# Patient Record
Sex: Male | Born: 1955 | Race: White | Hispanic: No | Smoking: Former smoker
Health system: Southern US, Community
[De-identification: ages and names within clinical notes are randomized; demographics above are authoritative.]

## PROBLEM LIST (undated history)

## (undated) DIAGNOSIS — K567 Ileus, unspecified: Secondary | ICD-10-CM

## (undated) DIAGNOSIS — R188 Other ascites: Secondary | ICD-10-CM

## (undated) DIAGNOSIS — C61 Malignant neoplasm of prostate: Secondary | ICD-10-CM

## (undated) DIAGNOSIS — E119 Type 2 diabetes mellitus without complications: Secondary | ICD-10-CM

## (undated) DIAGNOSIS — K469 Unspecified abdominal hernia without obstruction or gangrene: Secondary | ICD-10-CM

## (undated) HISTORY — PX: PROSTATE SURGERY: SHX751

---

## 2006-08-09 ENCOUNTER — Emergency Department (HOSPITAL_COMMUNITY): Admission: EM | Admit: 2006-08-09 | Discharge: 2006-08-09 | Payer: Self-pay | Admitting: Family Medicine

## 2014-04-17 ENCOUNTER — Other Ambulatory Visit: Payer: Self-pay | Admitting: Internal Medicine

## 2014-06-12 ENCOUNTER — Other Ambulatory Visit: Payer: Self-pay | Admitting: Internal Medicine

## 2014-07-18 ENCOUNTER — Other Ambulatory Visit: Payer: Self-pay | Admitting: Internal Medicine

## 2016-05-24 ENCOUNTER — Encounter (HOSPITAL_BASED_OUTPATIENT_CLINIC_OR_DEPARTMENT_OTHER): Payer: Self-pay | Admitting: *Deleted

## 2016-05-24 ENCOUNTER — Emergency Department (HOSPITAL_BASED_OUTPATIENT_CLINIC_OR_DEPARTMENT_OTHER)
Admission: EM | Admit: 2016-05-24 | Discharge: 2016-05-25 | Disposition: A | Discharge status: Home / Self Care | Payer: BLUE CROSS/BLUE SHIELD | Attending: Emergency Medicine | Admitting: Emergency Medicine

## 2016-05-24 DIAGNOSIS — K567 Ileus, unspecified: Secondary | ICD-10-CM | POA: Diagnosis not present

## 2016-05-24 DIAGNOSIS — F17210 Nicotine dependence, cigarettes, uncomplicated: Secondary | ICD-10-CM | POA: Diagnosis not present

## 2016-05-24 DIAGNOSIS — Z794 Long term (current) use of insulin: Secondary | ICD-10-CM | POA: Diagnosis not present

## 2016-05-24 DIAGNOSIS — R188 Other ascites: Secondary | ICD-10-CM | POA: Diagnosis not present

## 2016-05-24 DIAGNOSIS — Z8546 Personal history of malignant neoplasm of prostate: Secondary | ICD-10-CM | POA: Insufficient documentation

## 2016-05-24 DIAGNOSIS — M6289 Other specified disorders of muscle: Secondary | ICD-10-CM | POA: Insufficient documentation

## 2016-05-24 DIAGNOSIS — Z7984 Long term (current) use of oral hypoglycemic drugs: Secondary | ICD-10-CM | POA: Insufficient documentation

## 2016-05-24 DIAGNOSIS — Z79899 Other long term (current) drug therapy: Secondary | ICD-10-CM | POA: Insufficient documentation

## 2016-05-24 DIAGNOSIS — R1084 Generalized abdominal pain: Secondary | ICD-10-CM | POA: Diagnosis present

## 2016-05-24 HISTORY — DX: Malignant neoplasm of prostate: C61

## 2016-05-24 LAB — COMPREHENSIVE METABOLIC PANEL
ALT: 21 U/L (ref 17–63)
ANION GAP: 9 (ref 5–15)
AST: 21 U/L (ref 15–41)
Albumin: 4 g/dL (ref 3.5–5.0)
Alkaline Phosphatase: 62 U/L (ref 38–126)
BUN: 14 mg/dL (ref 6–20)
CHLORIDE: 105 mmol/L (ref 101–111)
CO2: 24 mmol/L (ref 22–32)
CREATININE: 0.79 mg/dL (ref 0.61–1.24)
Calcium: 9.4 mg/dL (ref 8.9–10.3)
Glucose, Bld: 163 mg/dL — ABNORMAL HIGH (ref 65–99)
POTASSIUM: 3.8 mmol/L (ref 3.5–5.1)
Sodium: 138 mmol/L (ref 135–145)
Total Bilirubin: 0.7 mg/dL (ref 0.3–1.2)
Total Protein: 7.3 g/dL (ref 6.5–8.1)

## 2016-05-24 LAB — CBC WITH DIFFERENTIAL/PLATELET
Basophils Absolute: 0 10*3/uL (ref 0.0–0.1)
Basophils Relative: 0 %
EOS ABS: 0.2 10*3/uL (ref 0.0–0.7)
EOS PCT: 1 %
HCT: 42.2 % (ref 39.0–52.0)
HEMOGLOBIN: 14.1 g/dL (ref 13.0–17.0)
LYMPHS ABS: 1.5 10*3/uL (ref 0.7–4.0)
LYMPHS PCT: 12 %
MCH: 32 pg (ref 26.0–34.0)
MCHC: 33.4 g/dL (ref 30.0–36.0)
MCV: 95.7 fL (ref 78.0–100.0)
MONOS PCT: 5 %
Monocytes Absolute: 0.6 10*3/uL (ref 0.1–1.0)
NEUTROS PCT: 82 %
Neutro Abs: 9.9 10*3/uL — ABNORMAL HIGH (ref 1.7–7.7)
Platelets: 265 10*3/uL (ref 150–400)
RBC: 4.41 MIL/uL (ref 4.22–5.81)
RDW: 12.9 % (ref 11.5–15.5)
WBC: 12.1 10*3/uL — AB (ref 4.0–10.5)

## 2016-05-24 LAB — I-STAT CG4 LACTIC ACID, ED: LACTIC ACID, VENOUS: 1.74 mmol/L (ref 0.5–2.0)

## 2016-05-24 LAB — LIPASE, BLOOD: LIPASE: 23 U/L (ref 11–51)

## 2016-05-24 LAB — TROPONIN I

## 2016-05-24 MED ORDER — FENTANYL CITRATE (PF) 100 MCG/2ML IJ SOLN
50.0000 ug | Freq: Once | INTRAMUSCULAR | Status: AC
  Administered 2016-05-24: 50 ug via INTRAVENOUS
  Filled 2016-05-24: qty 2

## 2016-05-24 MED ORDER — ONDANSETRON HCL 4 MG/2ML IJ SOLN
4.0000 mg | Freq: Once | INTRAMUSCULAR | Status: AC
  Administered 2016-05-24: 4 mg via INTRAVENOUS
  Filled 2016-05-24: qty 2

## 2016-05-24 MED ORDER — FENTANYL CITRATE (PF) 100 MCG/2ML IJ SOLN
100.0000 ug | Freq: Once | INTRAMUSCULAR | Status: AC
  Administered 2016-05-25: 100 ug via INTRAVENOUS
  Filled 2016-05-24: qty 2

## 2016-05-24 NOTE — ED Provider Notes (Signed)
CSN: [id number]     Arrival date & time 05/24/16  2209 History   By signing my name below, I, Higinio Plan, attest that this documentation has been prepared under the direction and in the presence of Iasia Forcier, MD . Electronically Signed: Higinio Plan, Scribe. 05/25/2016. 12:25 AM    Chief Complaint  Patient presents with  . Abdominal Pain   Patient is a 60 y.o. male presenting with abdominal pain. The history is provided by the patient. No language interpreter was used.  Abdominal Pain Pain location:  Generalized Pain quality: pressure   Pain radiates to:  Does not radiate Pain severity:  Severe Onset quality:  Gradual Timing:  Constant Progression:  Worsening Chronicity:  New Context: not alcohol use, not sick contacts and not trauma   Relieved by:  Nothing Worsened by:  Nothing tried Ineffective treatments:  None tried Associated symptoms: nausea and vomiting   Associated symptoms: no constipation, no cough, no dysuria and no fever   Risk factors: recent hospitalization   Risk factors: no alcohol abuse    HPI Comments:  Ryan [name] is a 60 y.o. male with PMHx of prostate cancer, who presents to the Emergency Department complaining of gradually worsening, "pressure-like," abdominal pain that began ~6pm this evening. Pt states associated symptoms of 2 episodes of vomiting today (1 episode was self-induced), 1 episode of diarrhea yesterday, diaphoresis, and belching. He reports some alleviation of pain after belching and BM. Per daughter, pt has a bump on his upper abdomen that could possibly be a hernia. His daughter also states an associated symptom of pallor. Pt denies back pain, dysuria, and urinary frequency. Per daughter, pt recently had a prostectomy due to cancer by Butch Penny at [facility]. He reports he is not currently undergoing chemotherapy.   Past Medical History  Diagnosis Date  . Prostate cancer [facility])    Past Surgical History  Procedure Laterality Date  . Prostate  surgery     History reviewed. No pertinent family history. Social History  Substance Use Topics  . Smoking status: Current Every Day Smoker -- 0.50 packs/day    Types: Cigarettes  . Smokeless tobacco: None  . Alcohol Use: 1.2 oz/week    2 Cans of beer per week    Review of Systems  Constitutional: Positive for diaphoresis. Negative for fever.  Respiratory: Negative for cough.   Gastrointestinal: Positive for nausea, vomiting and abdominal pain. Negative for constipation.  Genitourinary: Negative for dysuria and frequency.  Musculoskeletal: Negative for back pain.  All other systems reviewed and are negative.  Allergies  Review of patient's allergies indicates no known allergies.  Home Medications   Prior to Admission medications   Medication Sig Start Date End Date Taking? Authorizing Provider  insulin aspart (NOVOLOG) 100 UNIT/ML injection Inject into the skin 3 (three) times daily before meals.   Yes Historical Provider, MD  lisinopril (PRINIVIL,ZESTRIL) 40 MG tablet Take 40 mg by mouth daily.   Yes Historical Provider, MD  metFORMIN (GLUCOPHAGE) 1000 MG tablet Take 1,000 mg by mouth 2 (two) times daily with a meal.   Yes Historical Provider, MD   Triage Vitals: BP 174/95 mmHg  Pulse 74  Temp(Src) 97.8 F (36.6 C) (Oral)  Resp 18  Ht '6\' 2"'$  (1.88 m)  Wt 280 lb (127.007 kg)  BMI 35.93 kg/m2  SpO2 97% Physical Exam  Constitutional: He is oriented to person, place, and time. He appears well-developed and well-nourished. No distress.  HENT:  Head: Normocephalic and atraumatic.  Mouth/Throat: Oropharynx is clear and moist. No oropharyngeal exudate.  Moist mucous membranes   Eyes: Conjunctivae are normal. Pupils are equal, round, and reactive to light.  Neck: Normal range of motion. Neck supple. No JVD present.  Trachea midline No bruit  Cardiovascular: Normal rate, regular rhythm, normal heart sounds and intact distal pulses.   Pulmonary/Chest: Effort normal and breath  sounds normal. No stridor. No respiratory distress. He has no wheezes. He has no rales.  Abdominal: Soft. He exhibits distension. There is tenderness. There is no rebound, no guarding, no tenderness at McBurney's point and negative Murphy's sign. A hernia is present. Hernia confirmed positive in the ventral area.    Tinkling bowel sounds above umbilicus Quiet bowel sounds from umbilicus down Surgical wound: 1in in length and 8cm diameter fibrous debris with serosanguinous drainage  Musculoskeletal:  Legs soft  Upper left trocar has a 1inch, 8cm in diameter fibrous and draining  Neurological: He is alert and oriented to person, place, and time. He has normal reflexes.  Skin: Skin is warm and dry.  Psychiatric: He has a normal mood and affect. His behavior is normal.  Nursing note and vitals reviewed.   ED Course  Hernia reduction Date/Time: 05/25/2016 12:09 AM Performed by: Veatrice Kells Authorized by: Veatrice Kells Consent: Verbal consent obtained. Patient identity confirmed: arm band Preparation: Patient was prepped and draped in the usual sterile fashion. Local anesthesia used: no Patient sedated: no Patient tolerance: Patient tolerated the procedure well with no immediate complications Comments: Reduced with improvement in pain    DIAGNOSTIC STUDIES:  Oxygen Saturation is 97% on RA, normal by my interpretation.    COORDINATION OF CARE:  11:20 PM Discussed treatment plan, which includes NG tube with pt at bedside and pt agreed to plan.  Labs Review Labs Reviewed  COMPREHENSIVE METABOLIC PANEL - Abnormal; Notable for the following:    Glucose, Bld 163 (*)    All other components within normal limits  CBC WITH DIFFERENTIAL/PLATELET - Abnormal; Notable for the following:    WBC 12.1 (*)    Neutro Abs 9.9 (*)    All other components within normal limits  LIPASE, BLOOD  URINALYSIS, ROUTINE W REFLEX MICROSCOPIC (NOT AT [facility]  TROPONIN I  I-STAT CG4 LACTIC ACID, ED     Imaging Review No results found. I have personally reviewed and evaluated these images and lab results as part of my medical decision-making.   EKG Interpretation  Date/Time:  'Sunday May 24 2016 22:28:09 EDT Ventricular Rate:  77 PR Interval:  172 QRS Duration: 88 QT Interval:  378 QTC Calculation: 427 R Axis:   16 Text Interpretation:  Normal sinus rhythm Normal ECG No previous ECGs available Confirmed by LITTLE MD, RACHEL ([id number]) on 05/24/2016 10:31:10 PM        EKG Interpretation   Date/Time:  Sunday May 24 2016 22:28:09 EDT Ventricular Rate:  77 PR Interval:  172 QRS Duration: 88 QT Interval:  378 QTC Calculation: 427 R Axis:   16 Text Interpretation:  Normal sinus rhythm Normal ECG No previous ECGs  available Confirmed by LITTLE MD, RACHEL ([id number]) on 05/24/2016 10:31:10 PM     12'$ :09 AM Attempted to reduce abdominal hernia. Reduction successful.   MDM   Final diagnoses:  None    Filed Vitals:   05/25/16 0200 05/25/16 0230  BP: 137/70 156/87  Pulse: 81 83  Temp:    Resp: 14 12   Results for orders placed or performed during the hospital encounter of  05/24/16  Lipase, blood  Result Value Ref Range   Lipase 23 11 - 51 U/L  Comprehensive metabolic panel  Result Value Ref Range   Sodium 138 135 - 145 mmol/L   Potassium 3.8 3.5 - 5.1 mmol/L   Chloride 105 101 - 111 mmol/L   CO2 24 22 - 32 mmol/L   Glucose, Bld 163 (H) 65 - 99 mg/dL   BUN 14 6 - 20 mg/dL   Creatinine, Ser 0.79 0.61 - 1.24 mg/dL   Calcium 9.4 8.9 - 10.3 mg/dL   Total Protein 7.3 6.5 - 8.1 g/dL   Albumin 4.0 3.5 - 5.0 g/dL   AST 21 15 - 41 U/L   ALT 21 17 - 63 U/L   Alkaline Phosphatase 62 38 - 126 U/L   Total Bilirubin 0.7 0.3 - 1.2 mg/dL   GFR calc non Af Amer >60 >60 mL/min   GFR calc Af Amer >60 >60 mL/min   Anion gap 9 5 - 15  Troponin I  Result Value Ref Range   Troponin I <0.03 <0.031 ng/mL  CBC with Differential  Result Value Ref Range   WBC 12.1 (H) 4.0 - 10.5 K/uL    RBC 4.41 4.22 - 5.81 MIL/uL   Hemoglobin 14.1 13.0 - 17.0 g/dL   HCT 42.2 39.0 - 52.0 %   MCV 95.7 78.0 - 100.0 fL   MCH 32.0 26.0 - 34.0 pg   MCHC 33.4 30.0 - 36.0 g/dL   RDW 12.9 11.5 - 15.5 %   Platelets 265 150 - 400 K/uL   Neutrophils Relative % 82 %   Neutro Abs 9.9 (H) 1.7 - 7.7 K/uL   Lymphocytes Relative 12 %   Lymphs Abs 1.5 0.7 - 4.0 K/uL   Monocytes Relative 5 %   Monocytes Absolute 0.6 0.1 - 1.0 K/uL   Eosinophils Relative 1 %   Eosinophils Absolute 0.2 0.0 - 0.7 K/uL   Basophils Relative 0 %   Basophils Absolute 0.0 0.0 - 0.1 K/uL  I-Stat CG4 Lactic Acid, ED  Result Value Ref Range   Lactic Acid, Venous 1.74 0.5 - 2.0 mmol/L   Ct Abdomen Pelvis W Contrast  05/25/2016  CLINICAL DATA:  Pressure like abdominal pain beginning tonight. Recent prostatectomy (Apr 27, 2016). Per chart, hernia that has been reduced with symptomatic improvement. EXAM: CT ABDOMEN AND PELVIS WITH CONTRAST TECHNIQUE: Multidetector CT imaging of the abdomen and pelvis was performed using the standard protocol following bolus administration of intravenous contrast. CONTRAST:  153m ISOVUE-300 IOPAMIDOL (ISOVUE-300) INJECTION 61% COMPARISON:  03/11/2016 CT at [facility] - marked for merge. FINDINGS: Lower chest and abdominal wall: Small left inguinal hernia containing ascitic fluid. There is fatty enlargement of both inguinal canals. Small supraumbilical hernia containing ascitic fluid. Hepatobiliary: Scattered hepatic low densities consistent with cysts, stable.No evidence of biliary obstruction or stone. Pancreas: Unremarkable. Spleen: Unremarkable. Adrenals/Urinary Tract: Negative adrenals. No hydronephrosis or stone. Unremarkable bladder. Reproductive:Interval prostatectomy. No fluid collection or other abnormality at the resection site. No pelvic adenopathy. Stomach/Bowel: There is a segment of fluid-filled and distended small bowel in the lower central abdomen with mild mesenteric congestion and  interloop fluid. No evidence of deep vascularized bowel. No focal transition point. The colon is not decompressed. Vascular/Lymphatic: Atherosclerosis. No acute vascular finding. No mass or adenopathy. Peritoneal: Small volume ascites scattered throughout the abdomen, unexpected nearly 1 month after surgery. No specific cause of ascitic fluid is identified. Musculoskeletal: No acute finding.  Disc degeneration. IMPRESSION: 1. Few  distended loops of small bowel without focal transition point. Given recently reduced hernia, question resolving bowel obstruction. 2. Small ascites without visible cause, unexpected nearly 1 month after surgery. 3. Small midline and left inguinal hernias containing ascitic fluid. 4. Unremarkable appearance of prostatectomy site. Electronically Signed   By: Monte Fantasia M.D.   On: 05/25/2016 01:55    Medications  ondansetron (ZOFRAN) injection 4 mg (4 mg Intravenous Given 05/24/16 2252)  fentaNYL (SUBLIMAZE) injection 50 mcg (50 mcg Intravenous Given 05/24/16 2316)  fentaNYL (SUBLIMAZE) injection 100 mcg (100 mcg Intravenous Given 05/25/16 0009)  iopamidol (ISOVUE-300) 61 % injection 100 mL (100 mLs Intravenous Contrast Given 05/25/16 0117)    Case d/w Dr. Al Corpus of urology if PO challenged in the ED safe for discharge to be call for an appointment in clinic this am.    PO challenged successfully.  Feeling better. Patient and family are amenable to following up today at [facility].  Strict return precautions given.     I personally performed the services described in this documentation, which was scribed in my presence. The recorded information has been reviewed and is accurate.     Veatrice Kells, MD 05/25/16 [phone]

## 2016-05-24 NOTE — ED Notes (Signed)
Pt had prostate surgery on May 8th.  pts incision on his left lower quadrant is noted to be red, and have yellow drainage.

## 2016-05-24 NOTE — ED Notes (Signed)
Pt reports 'gassy' abdomen with vomiting that occurred before dinner tonight.  Pt appears pale, SOB, diaphoretic.

## 2016-05-24 NOTE — ED Notes (Signed)
Dr. Randal Buba at [facility].

## 2016-05-24 NOTE — ED Notes (Addendum)
Pt placed in trendelenburg by Dr. Randal Buba with ice pack on abd, pending reduction of abd hernia, hernia appeared after prostate surgery. (denies: fever, constipation or bleeding). States, urinary habits have not returned to normal. Last BM 2000, last ate 1900, last emesis 2245. Alert, NAD, calm, interactive, "feel better".

## 2016-05-25 ENCOUNTER — Emergency Department (HOSPITAL_BASED_OUTPATIENT_CLINIC_OR_DEPARTMENT_OTHER): Payer: BLUE CROSS/BLUE SHIELD

## 2016-05-25 ENCOUNTER — Encounter (HOSPITAL_BASED_OUTPATIENT_CLINIC_OR_DEPARTMENT_OTHER): Payer: Self-pay | Admitting: Emergency Medicine

## 2016-05-25 IMAGING — CT CT ABD-PELV W/ CM
2 of 5 series · 16 of 46 positions shown, 18 images · IV contrast (APPLIED)
Comparison: [DATE] CT at [REDACTED] - marked for
merge.

CLINICAL DATA: Pressure like abdominal pain beginning tonight.
Recent prostatectomy ([DATE]). Per chart, hernia that has been
reduced with symptomatic improvement.

EXAM:
CT ABDOMEN AND PELVIS WITH CONTRAST
TECHNIQUE: Multidetector CT imaging of the abdomen and pelvis was performed
using the standard protocol following bolus administration of
intravenous contrast.
CONTRAST:  100mL [43] IOPAMIDOL ([43]) INJECTION 61%

[Series 2: axial st · axial · 0.97mm/px · z∈[-573,-73]mm · 13 of 112 slices shown, 15 images]
[im 6/112  soft-tissue]
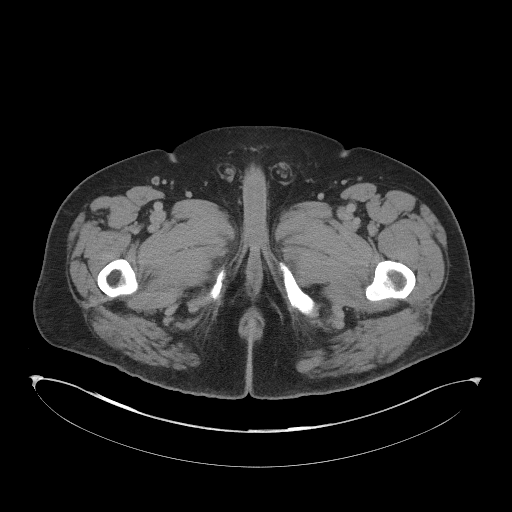
[im 6/112  bone]
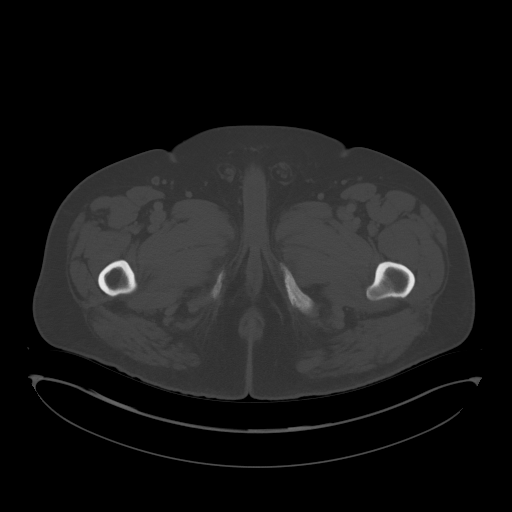
[im 17/112  soft-tissue]
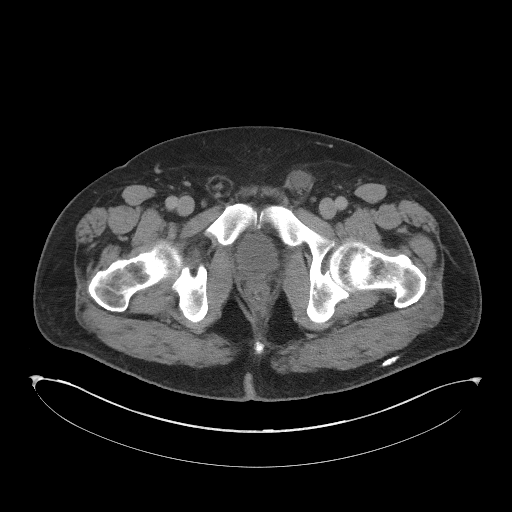
[im 23/112  soft-tissue]
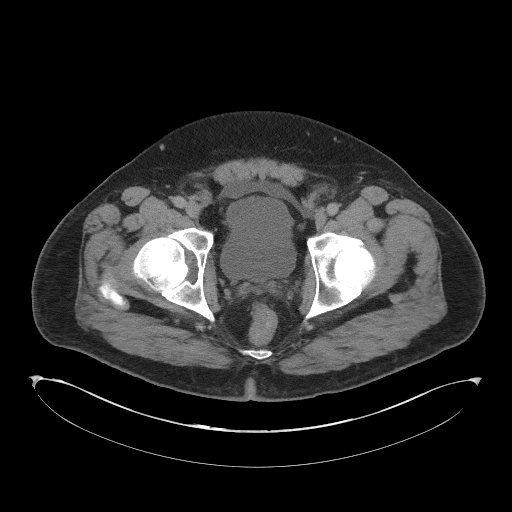
[im 34/112  soft-tissue]
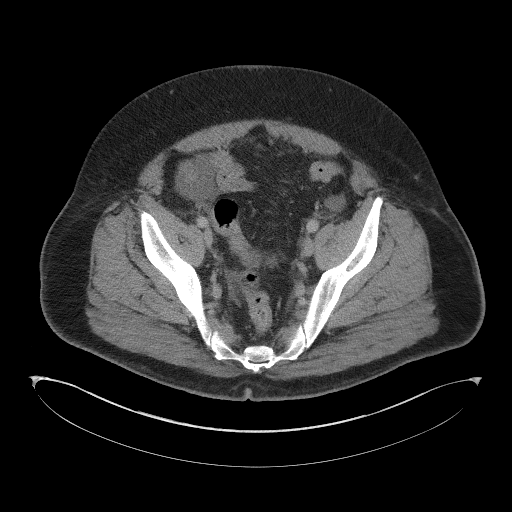
[im 39/112  soft-tissue]
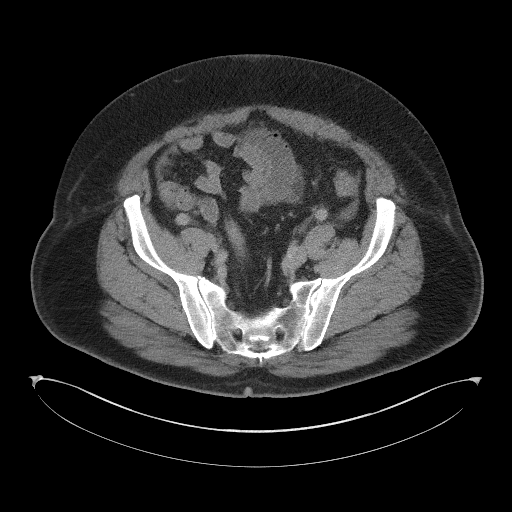
[im 50/112  soft-tissue]
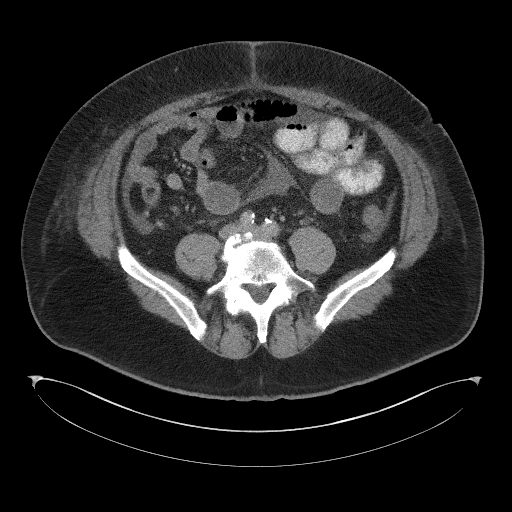
[im 56/112  soft-tissue]
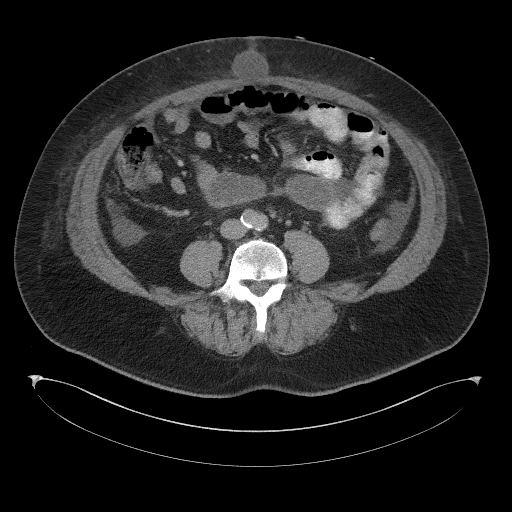
[im 62/112  soft-tissue]
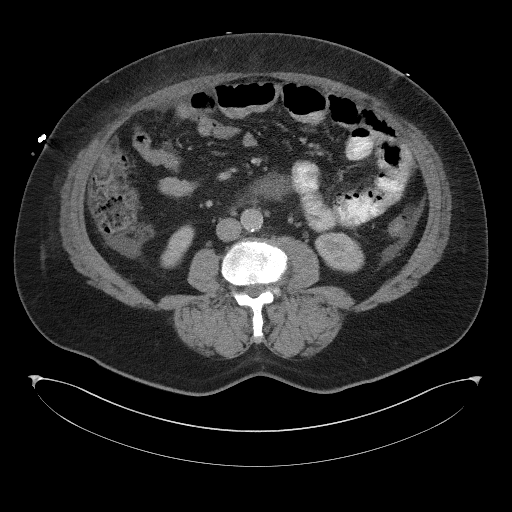
[im 73/112  soft-tissue]
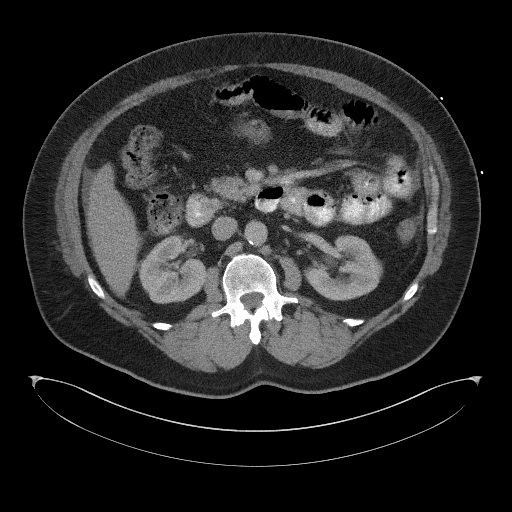
[im 73/112  bone]
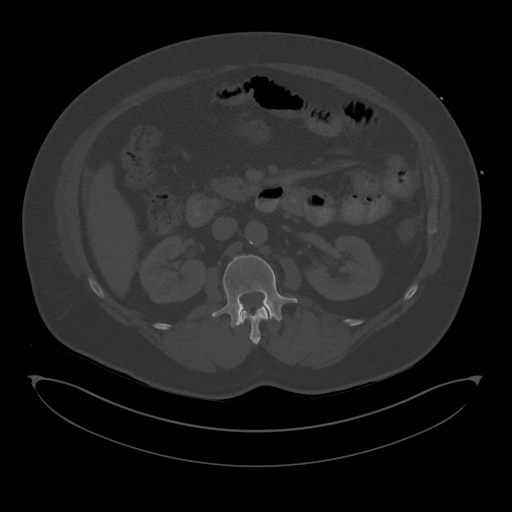
[im 78/112  soft-tissue]
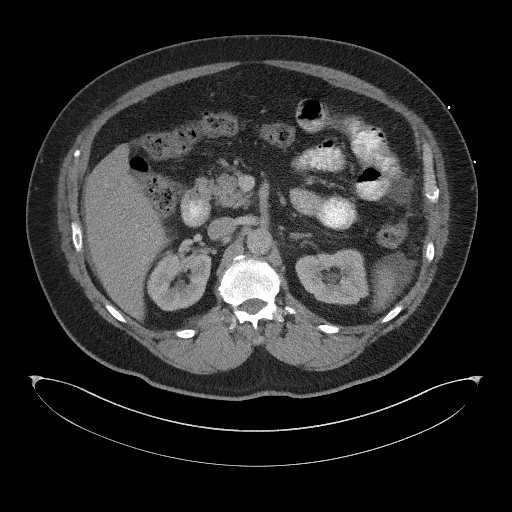
[im 89/112  soft-tissue]
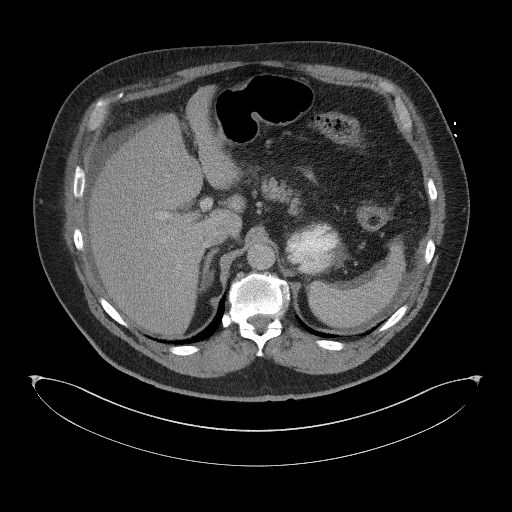
[im 95/112  soft-tissue]
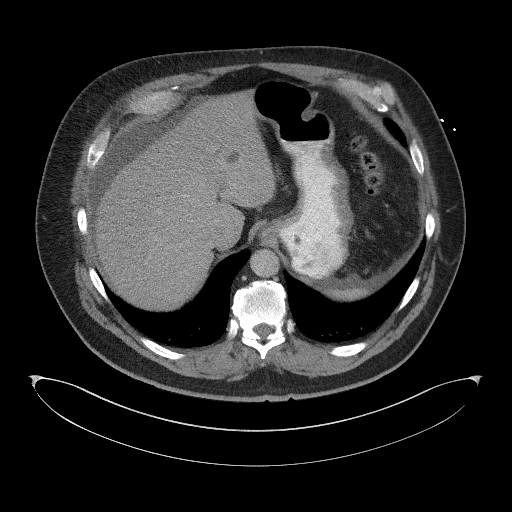
[im 106/112  soft-tissue]
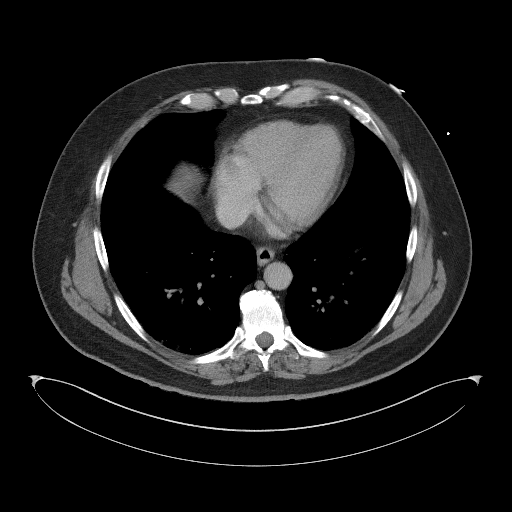

[Series 5: coronal st · coronal · 1.17mm/px · 3 of 115 slices shown]
[im 39/115  soft-tissue]
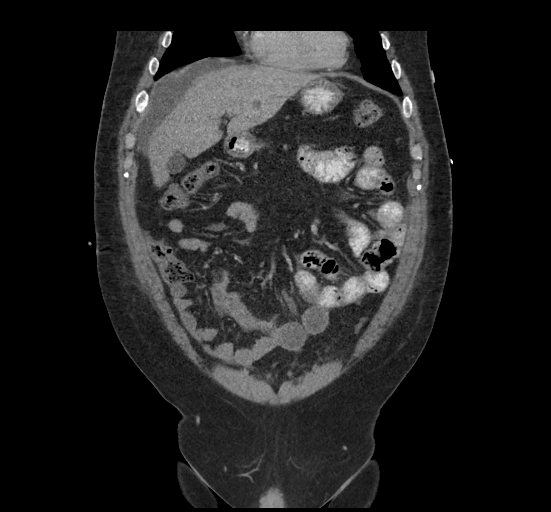
[im 51/115  soft-tissue]
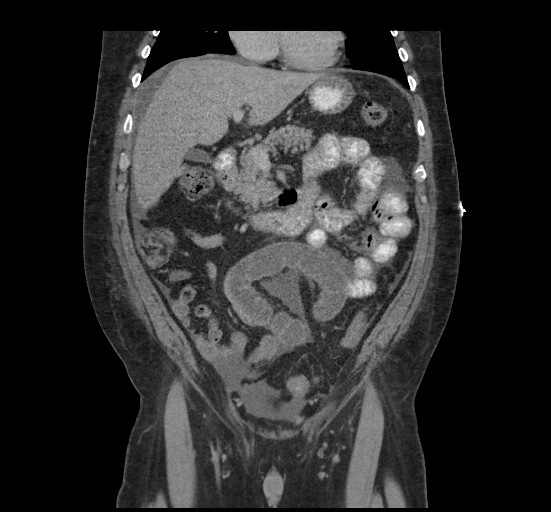
[im 64/115  soft-tissue]
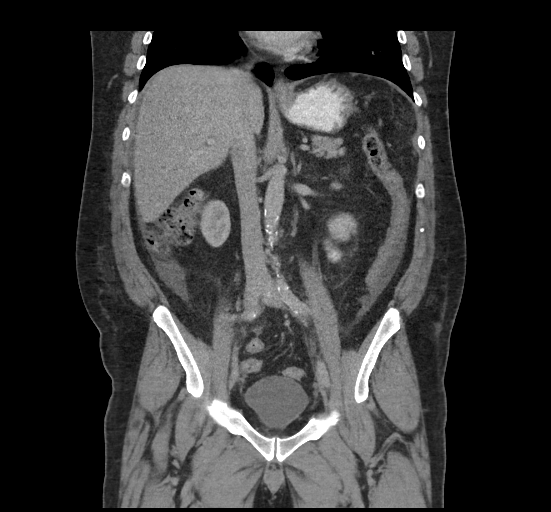

[16 of 46 positions shown; findings below may reference images not displayed]

FINDINGS: Lower chest and abdominal wall: Small left inguinal hernia
containing ascitic fluid. There is fatty enlargement of both
inguinal canals. Small supraumbilical hernia containing ascitic
fluid.

Hepatobiliary: Scattered hepatic low densities consistent with
cysts, stable.No evidence of biliary obstruction or stone.

Pancreas: Unremarkable.

Spleen: Unremarkable.

Adrenals/Urinary Tract: Negative adrenals. No hydronephrosis or
stone. Unremarkable bladder.

Reproductive:Interval prostatectomy. No fluid collection or other
abnormality at the resection site. No pelvic adenopathy.

Stomach/Bowel: There is a segment of fluid-filled and distended
small bowel in the lower central abdomen with mild mesenteric
congestion and interloop fluid. No evidence of deep vascularized
bowel. No focal transition point. The colon is not decompressed.

Vascular/Lymphatic: Atherosclerosis. No acute vascular finding. No
mass or adenopathy.

Peritoneal: Small volume ascites scattered throughout the abdomen,
unexpected nearly 1 month after surgery. No specific cause of
ascitic fluid is identified.

Musculoskeletal: No acute finding.  Disc degeneration.
IMPRESSION: 1. Few distended loops of small bowel without focal transition
point. Given recently reduced hernia, question resolving bowel
obstruction.
2. Small ascites without visible cause, unexpected nearly 1 month
after surgery.
3. Small midline and left inguinal hernias containing ascitic fluid.
4. Unremarkable appearance of prostatectomy site.

## 2016-05-25 MED ORDER — ONDANSETRON 8 MG PO TBDP: ORAL_TABLET | ORAL | Status: DC

## 2016-05-25 MED ORDER — FENTANYL CITRATE (PF) 100 MCG/2ML IJ SOLN: 100.0000 ug | INTRAMUSCULAR | Status: DC | PRN

## 2016-05-25 MED ORDER — IOPAMIDOL (ISOVUE-300) INJECTION 61%
100.0000 mL | Freq: Once | INTRAVENOUS | Status: AC | PRN
  Administered 2016-05-25: 100 mL via INTRAVENOUS

## 2016-05-25 MED ORDER — ABDOMINAL BINDER/ELASTIC 2XL MISC: 1.0000 [IU] | Freq: Every day | Status: DC

## 2016-05-25 NOTE — ED Notes (Signed)
Apple Juice given and pt is tolerating well. Dr. Randal Buba at bedside.

## 2016-05-25 NOTE — ED Notes (Signed)
Back from CT, alert, NAD, calm, interactive, no dyspnea noted, HOB 30 degrees.

## 2016-05-25 NOTE — ED Notes (Signed)
Dr. Randal Buba in to reduce abd hernia, "successful reduction", pt moved from trendelenburg to flat, lying on side to drink PO contrast.

## 2016-05-25 NOTE — ED Notes (Signed)
Dr. Randal Buba at [facility]

## 2018-12-28 ENCOUNTER — Encounter (HOSPITAL_BASED_OUTPATIENT_CLINIC_OR_DEPARTMENT_OTHER): Payer: Self-pay

## 2018-12-28 ENCOUNTER — Emergency Department (HOSPITAL_BASED_OUTPATIENT_CLINIC_OR_DEPARTMENT_OTHER)
Admission: EM | Admit: 2018-12-28 | Discharge: 2018-12-28 | Disposition: A | Discharge status: Home / Self Care | Payer: BLUE CROSS/BLUE SHIELD | Attending: Emergency Medicine | Admitting: Emergency Medicine

## 2018-12-28 ENCOUNTER — Other Ambulatory Visit: Payer: Self-pay

## 2018-12-28 DIAGNOSIS — E119 Type 2 diabetes mellitus without complications: Secondary | ICD-10-CM | POA: Diagnosis not present

## 2018-12-28 DIAGNOSIS — F17210 Nicotine dependence, cigarettes, uncomplicated: Secondary | ICD-10-CM | POA: Insufficient documentation

## 2018-12-28 DIAGNOSIS — Z79899 Other long term (current) drug therapy: Secondary | ICD-10-CM | POA: Insufficient documentation

## 2018-12-28 DIAGNOSIS — Z794 Long term (current) use of insulin: Secondary | ICD-10-CM | POA: Insufficient documentation

## 2018-12-28 DIAGNOSIS — Z8546 Personal history of malignant neoplasm of prostate: Secondary | ICD-10-CM | POA: Diagnosis not present

## 2018-12-28 DIAGNOSIS — K439 Ventral hernia without obstruction or gangrene: Secondary | ICD-10-CM | POA: Insufficient documentation

## 2018-12-28 DIAGNOSIS — R1033 Periumbilical pain: Secondary | ICD-10-CM | POA: Diagnosis present

## 2018-12-28 HISTORY — DX: Other ascites: R18.8

## 2018-12-28 HISTORY — DX: Ileus, unspecified: K56.7

## 2018-12-28 HISTORY — DX: Type 2 diabetes mellitus without complications: E11.9

## 2018-12-28 HISTORY — DX: Unspecified abdominal hernia without obstruction or gangrene: K46.9

## 2018-12-28 LAB — CBC WITH DIFFERENTIAL/PLATELET
Abs Immature Granulocytes: 0.03 10*3/uL (ref 0.00–0.07)
BASOS PCT: 1 %
Basophils Absolute: 0.1 10*3/uL (ref 0.0–0.1)
EOS ABS: 0.3 10*3/uL (ref 0.0–0.5)
Eosinophils Relative: 3 %
HCT: 44.7 % (ref 39.0–52.0)
Hemoglobin: 15 g/dL (ref 13.0–17.0)
Immature Granulocytes: 0 %
Lymphocytes Relative: 35 %
Lymphs Abs: 3.5 10*3/uL (ref 0.7–4.0)
MCH: 32.5 pg (ref 26.0–34.0)
MCHC: 33.6 g/dL (ref 30.0–36.0)
MCV: 97 fL (ref 80.0–100.0)
MONO ABS: 0.8 10*3/uL (ref 0.1–1.0)
Monocytes Relative: 8 %
NEUTROS ABS: 5.4 10*3/uL (ref 1.7–7.7)
NEUTROS PCT: 53 %
PLATELETS: 234 10*3/uL (ref 150–400)
RBC: 4.61 MIL/uL (ref 4.22–5.81)
RDW: 12.1 % (ref 11.5–15.5)
WBC: 10.2 10*3/uL (ref 4.0–10.5)
nRBC: 0 % (ref 0.0–0.2)

## 2018-12-28 LAB — COMPREHENSIVE METABOLIC PANEL
ALT: 21 U/L (ref 0–44)
AST: 21 U/L (ref 15–41)
Albumin: 4.1 g/dL (ref 3.5–5.0)
Alkaline Phosphatase: 54 U/L (ref 38–126)
Anion gap: 9 (ref 5–15)
BUN: 12 mg/dL (ref 8–23)
CHLORIDE: 105 mmol/L (ref 98–111)
CO2: 23 mmol/L (ref 22–32)
CREATININE: 0.73 mg/dL (ref 0.61–1.24)
Calcium: 9.1 mg/dL (ref 8.9–10.3)
Glucose, Bld: 193 mg/dL — ABNORMAL HIGH (ref 70–99)
POTASSIUM: 3.6 mmol/L (ref 3.5–5.1)
Sodium: 137 mmol/L (ref 135–145)
Total Bilirubin: 0.6 mg/dL (ref 0.3–1.2)
Total Protein: 7.1 g/dL (ref 6.5–8.1)

## 2018-12-28 LAB — LIPASE, BLOOD: LIPASE: 102 U/L — AB (ref 11–51)

## 2018-12-28 MED ORDER — HYDROMORPHONE HCL 1 MG/ML IJ SOLN
1.0000 mg | Freq: Once | INTRAMUSCULAR | Status: AC
Start: 2018-12-28 — End: 2018-12-28
  Administered 2018-12-28: 1 mg via INTRAVENOUS
  Filled 2018-12-28: qty 1

## 2018-12-28 MED ORDER — ONDANSETRON HCL 4 MG/2ML IJ SOLN
4.0000 mg | Freq: Once | INTRAMUSCULAR | Status: AC
  Administered 2018-12-28: 4 mg via INTRAVENOUS
  Filled 2018-12-28: qty 2

## 2018-12-28 NOTE — ED Triage Notes (Signed)
C/o abd pain x 1 hour-NAD-steady gait

## 2018-12-28 NOTE — ED Provider Notes (Signed)
Hernia reduction Date/Time: 12/28/2018 4:59 PM Performed by: Quintella Reichert, MD Authorized by: Quintella Reichert, MD  Consent: Verbal consent obtained. Patient identity confirmed: verbally with patient Local anesthesia used: pain control with dilaudid.  Anesthesia: Local anesthesia used: pain control with dilaudid. Patient tolerance: Patient tolerated the procedure well with no immediate complications    Ventral hernia reduced with gentle, steady pressure, patient in supine position.     Quintella Reichert, MD 12/28/18 2019

## 2018-12-28 NOTE — Discharge Instructions (Addendum)
If you develop any belly pain, vomiting or nausea, please come back to the Emergency Room. You will need to wear your abdominal binder until you follow up with surgery.   Please call and schedule follow up with your surgeon to discuss repair of your hernia tomorrow.

## 2018-12-28 NOTE — ED Notes (Signed)
C/o abd pain sudden onset 1 hour ago while sleeping  Denies inj  Hx of hernia

## 2018-12-28 NOTE — ED Provider Notes (Signed)
[place] EMERGENCY DEPARTMENT Provider Note   CSN: [id number] Arrival date & time: 12/28/18  1558   History   Chief Complaint Chief Complaint  Patient presents with  . Abdominal Pain    HPI Ryan [name] is a 63 y.o. male.  Patient reporting today after waking up with extreme abdominal pain.  Reports that he woke up about 45 minutes prior to coming into the emergency room.  He has a known umbilical hernia that has previously had to be reduced 2 years ago.  Patient states that he feels about the same now.  He states that he is in a lot of pain.  Patient denies any fevers, chills, nausea, vomiting, diarrhea.  He has not tried anything for the pain.  The history is provided by the patient and the spouse.    Past Medical History:  Diagnosis Date  . Ascites   . Diabetes mellitus without complication ([place])   . Hernia of abdominal cavity   . Ileus ([place])   . Prostate cancer ([place])     There are no active problems to display for this patient.   Past Surgical History:  Procedure Laterality Date  . PROSTATE SURGERY        Home Medications    Prior to Admission medications   Medication Sig Start Date End Date Taking? Authorizing Provider  Elastic Bandages & Supports (ABDOMINAL BINDER/ELASTIC 2XL) MISC 1 Units by Does not apply route daily. 05/25/16   Palumbo, April, MD  insulin aspart (NOVOLOG) 100 UNIT/ML injection Inject into the skin 3 (three) times daily before meals.    [provider]  lisinopril (PRINIVIL,ZESTRIL) 40 MG tablet Take 40 mg by mouth daily.    [provider]  metFORMIN (GLUCOPHAGE) 1000 MG tablet Take 1,000 mg by mouth 2 (two) times daily with a meal.    [provider]  ondansetron (ZOFRAN ODT) 8 MG disintegrating tablet 8mg  ODT q8 hours prn nausea 05/25/16   Palumbo, April, MD    Family History No family history on file.  Social History Social History   Tobacco Use  . Smoking status: Current Every Day Smoker   Packs/day: 0.50    Types: Cigarettes  . Smokeless tobacco: Never Used  Substance Use Topics  . Alcohol use: Yes    Comment: occ  . Drug use: No     Allergies   Patient has no known allergies.   Review of Systems Review of Systems  Constitutional: Negative for chills and fever.  Gastrointestinal: Positive for abdominal pain. Negative for diarrhea, nausea and vomiting.  All other systems reviewed and are negative.   Physical Exam Updated Vital Signs BP (!) 161/77 (BP Location: Right Arm)   Pulse 82   Temp 97.7 F (36.5 C) (Oral)   Resp 20   Ht 6\' 2"  (1.88 m)   Wt 123.8 kg   SpO2 99%   BMI 35.05 kg/m   Physical Exam Constitutional:      General: He is not in acute distress.    Appearance: He is obese.  HENT:     Head: Normocephalic and atraumatic.  Pulmonary:     Effort: Pulmonary effort is normal.  Abdominal:     General: A surgical scar is present.     Palpations: There is mass.     Tenderness: There is abdominal tenderness in the periumbilical area.     Hernia: A hernia is present. Hernia is present in the ventral area.     Comments: Large, difficult  to reduce ventral hernia. Was able to reduce.   Neurological:     General: No focal deficit present.     Mental Status: He is alert.  Psychiatric:        Behavior: Behavior normal.     ED Treatments / Results  Labs (all labs ordered are listed, but only abnormal results are displayed) Labs Reviewed  COMPREHENSIVE METABOLIC PANEL - Abnormal; Notable for the following components:      Result Value   Glucose, Bld 193 (*)    All other components within normal limits  LIPASE, BLOOD - Abnormal; Notable for the following components:   Lipase 102 (*)    All other components within normal limits  CBC WITH DIFFERENTIAL/PLATELET    EKG None  Radiology No results found.  Procedures Procedures (including critical care time)  Medications Ordered in ED Medications  HYDROmorphone (DILAUDID) injection 1 mg  (1 mg Intravenous Given 12/28/18 1632)  ondansetron (ZOFRAN) injection 4 mg (4 mg Intravenous Given 12/28/18 1632)    Initial Impression / Assessment and Plan / ED Course  I have reviewed the triage vital signs and the nursing notes.  Pertinent labs & imaging results that were available during my care of the patient were reviewed by me and considered in my medical decision making (see chart for details).   Patient is a 63 year old male here with incarcerated umbilical hernia.  Initially unable to reproduce.  Patient given Zofran as well as 1 mg IV Dilaudid.  After pain medicine and affect hernia was reduced with some difficulty.  However afterwards patient reports much improved pain.  We will monitor patient and do p.o. challenge.  Patient to follow-up with his surgeon outpatient to discuss whether or not he may need to have this hernia repaired.  Given strict return precautions.  Given abdominal binder.  [place] Dorien [place], DO PGY-2, [facility]   Final Clinical Impressions(s) / ED Diagnoses   Final diagnoses:  Ventral hernia without obstruction or gangrene    ED Discharge Orders    None       Janari Gagner, [place], DO 12/28/18 1751    Quintella Reichert, MD 12/28/18 2021

## 2019-01-01 ENCOUNTER — Emergency Department (HOSPITAL_BASED_OUTPATIENT_CLINIC_OR_DEPARTMENT_OTHER)
Admission: EM | Admit: 2019-01-01 | Discharge: 2019-01-01 | Disposition: A | Discharge status: Home / Self Care | Payer: BLUE CROSS/BLUE SHIELD | Attending: Emergency Medicine | Admitting: Emergency Medicine

## 2019-01-01 ENCOUNTER — Encounter (HOSPITAL_BASED_OUTPATIENT_CLINIC_OR_DEPARTMENT_OTHER): Payer: Self-pay | Admitting: Emergency Medicine

## 2019-01-01 ENCOUNTER — Other Ambulatory Visit: Payer: Self-pay

## 2019-01-01 DIAGNOSIS — Z79899 Other long term (current) drug therapy: Secondary | ICD-10-CM | POA: Diagnosis not present

## 2019-01-01 DIAGNOSIS — Z8546 Personal history of malignant neoplasm of prostate: Secondary | ICD-10-CM | POA: Insufficient documentation

## 2019-01-01 DIAGNOSIS — E119 Type 2 diabetes mellitus without complications: Secondary | ICD-10-CM | POA: Diagnosis not present

## 2019-01-01 DIAGNOSIS — K432 Incisional hernia without obstruction or gangrene: Secondary | ICD-10-CM | POA: Diagnosis not present

## 2019-01-01 DIAGNOSIS — Z794 Long term (current) use of insulin: Secondary | ICD-10-CM | POA: Insufficient documentation

## 2019-01-01 DIAGNOSIS — F17210 Nicotine dependence, cigarettes, uncomplicated: Secondary | ICD-10-CM | POA: Insufficient documentation

## 2019-01-01 DIAGNOSIS — R109 Unspecified abdominal pain: Secondary | ICD-10-CM | POA: Diagnosis present

## 2019-01-01 LAB — COMPREHENSIVE METABOLIC PANEL
ALT: 22 U/L (ref 0–44)
AST: 21 U/L (ref 15–41)
Albumin: 4 g/dL (ref 3.5–5.0)
Alkaline Phosphatase: 65 U/L (ref 38–126)
Anion gap: 9 (ref 5–15)
BUN: 17 mg/dL (ref 8–23)
CO2: 24 mmol/L (ref 22–32)
Calcium: 9.2 mg/dL (ref 8.9–10.3)
Chloride: 100 mmol/L (ref 98–111)
Creatinine, Ser: 0.96 mg/dL (ref 0.61–1.24)
GFR calc Af Amer: >60 mL/min (ref >60)
GFR calc non Af Amer: >60 mL/min (ref >60)
GLUCOSE: 238 mg/dL — AB (ref 70–99)
Potassium: 4.5 mmol/L (ref 3.5–5.1)
Sodium: 133 mmol/L — ABNORMAL LOW (ref 135–145)
Total Bilirubin: 0.5 mg/dL (ref 0.3–1.2)
Total Protein: 7.2 g/dL (ref 6.5–8.1)

## 2019-01-01 LAB — CBC WITH DIFFERENTIAL/PLATELET
ABS IMMATURE GRANULOCYTES: 0.04 10*3/uL (ref 0.00–0.07)
Basophils Absolute: 0 10*3/uL (ref 0.0–0.1)
Basophils Relative: 0 %
EOS ABS: 0.5 10*3/uL (ref 0.0–0.5)
EOS PCT: 5 %
HCT: 43.5 % (ref 39.0–52.0)
Hemoglobin: 14.5 g/dL (ref 13.0–17.0)
IMMATURE GRANULOCYTES: 0 %
Lymphocytes Relative: 30 %
Lymphs Abs: 2.8 10*3/uL (ref 0.7–4.0)
MCH: 32.2 pg (ref 26.0–34.0)
MCHC: 33.3 g/dL (ref 30.0–36.0)
MCV: 96.5 fL (ref 80.0–100.0)
MONO ABS: 0.6 10*3/uL (ref 0.1–1.0)
Monocytes Relative: 7 %
NRBC: 0 % (ref 0.0–0.2)
Neutro Abs: 5.3 10*3/uL (ref 1.7–7.7)
Neutrophils Relative %: 58 %
PLATELETS: 243 10*3/uL (ref 150–400)
RBC: 4.51 MIL/uL (ref 4.22–5.81)
RDW: 11.9 % (ref 11.5–15.5)
WBC: 9.3 10*3/uL (ref 4.0–10.5)

## 2019-01-01 LAB — LIPASE, BLOOD: Lipase: 22 U/L (ref 11–51)

## 2019-01-01 MED ORDER — HYDROMORPHONE HCL 1 MG/ML IJ SOLN
INTRAMUSCULAR | Status: AC
  Filled 2019-01-01: qty 1

## 2019-01-01 MED ORDER — HYDROMORPHONE HCL 1 MG/ML IJ SOLN
1.0000 mg | Freq: Once | INTRAMUSCULAR | Status: AC
  Administered 2019-01-01: 1 mg via INTRAVENOUS
  Filled 2019-01-01: qty 1

## 2019-01-01 MED ORDER — SODIUM CHLORIDE 0.9 % IV BOLUS
500.0000 mL | Freq: Once | INTRAVENOUS | Status: AC
  Administered 2019-01-01: 500 mL via INTRAVENOUS

## 2019-01-01 MED ORDER — TRAMADOL HCL 50 MG PO TABS: 50.0000 mg | ORAL_TABLET | Freq: Four times a day (QID) | ORAL | 0 refills | Status: DC | PRN

## 2019-01-01 MED ORDER — HYDROMORPHONE HCL 1 MG/ML IJ SOLN
1.0000 mg | Freq: Once | INTRAMUSCULAR | Status: AC
Start: 2019-01-01 — End: 2019-01-01
  Administered 2019-01-01: 1 mg via INTRAVENOUS
  Filled 2019-01-01: qty 1

## 2019-01-01 MED ORDER — SODIUM CHLORIDE 0.9 % IV SOLN
INTRAVENOUS | Status: DC
  Administered 2019-01-01: 09:00:00 via INTRAVENOUS

## 2019-01-01 MED ORDER — ONDANSETRON HCL 4 MG/2ML IJ SOLN
4.0000 mg | Freq: Once | INTRAMUSCULAR | Status: AC
  Administered 2019-01-01: 4 mg via INTRAVENOUS
  Filled 2019-01-01: qty 2

## 2019-01-01 MED ORDER — LORAZEPAM 2 MG/ML IJ SOLN
1.0000 mg | Freq: Once | INTRAMUSCULAR | Status: AC
  Administered 2019-01-01: 1 mg via INTRAVENOUS
  Filled 2019-01-01: qty 1

## 2019-01-01 MED ORDER — HYDROMORPHONE HCL 1 MG/ML IJ SOLN
1.0000 mg | Freq: Once | INTRAMUSCULAR | Status: AC
  Administered 2019-01-01: 1 mg via INTRAVENOUS

## 2019-01-01 NOTE — Discharge Instructions (Signed)
Follow back up with general surgery at [facility] as planned for your hernia repair on January 23.  Return for any new or worse symptoms.  As we discussed there should be some soreness around the area.  But there should not be any fevers any significant abdominal pain or nausea and vomiting.  Wear your hernia brace as directed.

## 2019-01-01 NOTE — ED Triage Notes (Signed)
Pt reports pain around abd hernia x 30 min. Was seen here earlier this week for same. He has surgery scheduled on the 23rd.

## 2019-01-01 NOTE — ED Notes (Signed)
Asking for more pain medicine, ED MD aware, remains drowsy

## 2019-01-01 NOTE — ED Notes (Signed)
ED Provider at bedside. 

## 2019-01-01 NOTE — ED Provider Notes (Signed)
[facility] EMERGENCY DEPARTMENT Provider Note   CSN: [id number] Arrival date & time: 01/01/19  0705     History   Chief Complaint Chief Complaint  Patient presents with  . Abdominal Pain    HPI Rosie Golson is a 63 y.o. male.  Patient with a known history of ventral hernia.  Being followed by general surgery at [facility].  He saw them on January 10.  He is scheduled for ventral hernia repair with mesh on January 23.  Seen here for ventral hernia pain on January 8.  They were able to reduce the hernia.  Patient states that it got stuck again about 30 minutes prior to arrival.  No nausea or vomiting.  Past medical history just significant for diabetes.  According to general surgery notes at [facility] patient had a prior operation for prostate cancer and this is where the hernia has occurred.     Past Medical History:  Diagnosis Date  . Ascites   . Diabetes mellitus without complication ([place])   . Hernia of abdominal cavity   . Ileus ([place])   . Prostate cancer ([place])     There are no active problems to display for this patient.   Past Surgical History:  Procedure Laterality Date  . PROSTATE SURGERY          Home Medications    Prior to Admission medications   Medication Sig Start Date End Date Taking? Authorizing Provider  Elastic Bandages & Supports (ABDOMINAL BINDER/ELASTIC 2XL) MISC 1 Units by Does not apply route daily. 05/25/16   Palumbo, April, MD  insulin aspart (NOVOLOG) 100 UNIT/ML injection Inject into the skin 3 (three) times daily before meals.    [provider]  lisinopril (PRINIVIL,ZESTRIL) 40 MG tablet Take 40 mg by mouth daily.    [provider]  metFORMIN (GLUCOPHAGE) 1000 MG tablet Take 1,000 mg by mouth 2 (two) times daily with a meal.    [provider]  ondansetron (ZOFRAN ODT) 8 MG disintegrating tablet 8mg  ODT q8 hours prn nausea 05/25/16   Palumbo, April, MD    Family History No family history on file.  Social  History Social History   Tobacco Use  . Smoking status: Current Every Day Smoker    Packs/day: 0.50    Types: Cigarettes  . Smokeless tobacco: Never Used  Substance Use Topics  . Alcohol use: Yes    Comment: occ  . Drug use: No     Allergies   Patient has no known allergies.   Review of Systems Review of Systems  Constitutional: Negative for chills and fever.  HENT: Negative for rhinorrhea and sore throat.   Eyes: Negative for visual disturbance.  Respiratory: Negative for cough and shortness of breath.   Cardiovascular: Negative for chest pain and leg swelling.  Gastrointestinal: Positive for abdominal pain. Negative for diarrhea, nausea and vomiting.  Genitourinary: Negative for dysuria.  Musculoskeletal: Negative for back pain and neck pain.  Skin: Negative for rash.  Neurological: Negative for dizziness, light-headedness and headaches.  Hematological: Does not bruise/bleed easily.  Psychiatric/Behavioral: Negative for confusion.     Physical Exam Updated Vital Signs BP (!) 199/98 (BP Location: Left Arm)   Pulse (!) 102   Temp 98.8 F (37.1 C) (Oral)   Resp (!) 24   Ht 1.88 m (6\' 2" )   Wt 123 kg   SpO2 99%   BMI 34.82 kg/m   Physical Exam Vitals signs and nursing note reviewed.  Constitutional:  Appearance: He is well-developed.  HENT:     Head: Normocephalic and atraumatic.  Eyes:     Extraocular Movements: Extraocular movements intact.     Conjunctiva/sclera: Conjunctivae normal.     Pupils: Pupils are equal, round, and reactive to light.  Neck:     Musculoskeletal: Neck supple.  Cardiovascular:     Rate and Rhythm: Normal rate and regular rhythm.     Heart sounds: No murmur.  Pulmonary:     Effort: Pulmonary effort is normal. No respiratory distress.     Breath sounds: Normal breath sounds.  Abdominal:     General: Bowel sounds are normal. There is distension.     Tenderness: There is abdominal tenderness in the periumbilical area.      Hernia: A hernia is present. Hernia is present in the ventral area.  Skin:    General: Skin is warm and dry.  Neurological:     General: No focal deficit present.     Mental Status: He is alert and oriented to person, place, and time.      ED Treatments / Results  Labs (all labs ordered are listed, but only abnormal results are displayed) Labs Reviewed  CBC WITH DIFFERENTIAL/PLATELET  COMPREHENSIVE METABOLIC PANEL  LIPASE, BLOOD    EKG None  Radiology No results found.  Procedures Procedures (including critical care time)  Medications Ordered in ED Medications  0.9 %  sodium chloride infusion (has no administration in time range)  sodium chloride 0.9 % bolus 500 mL (500 mLs Intravenous New Bag/Given 01/01/19 0735)  ondansetron (ZOFRAN) injection 4 mg (4 mg Intravenous Given 01/01/19 0734)  HYDROmorphone (DILAUDID) injection 1 mg (1 mg Intravenous Given 01/01/19 0735)  HYDROmorphone (DILAUDID) injection 1 mg (1 mg Intravenous Given 01/01/19 0748)     Initial Impression / Assessment and Plan / ED Course  I have reviewed the triage vital signs and the nursing notes.  Pertinent labs & imaging results that were available during my care of the patient were reviewed by me and considered in my medical decision making (see chart for details).     Patient with recurrent ventral hernia.  Initially not reducible.  Will give pain medication and try to get him relaxed and see if we can reduce it.  Running the difficulties patient may require CT scan of the abdomen.  Patient followed by general surgery at [facility].  Patient without any toxic symptoms no fever at the moment no nausea or vomiting.  On initial exam large ventral hernia just superior to the umbilicus.  Hernia mass measuring probably about 10 cm x 10 cm.  With pain medication and some Ativan and gentle massaging over period of time able to get the hernia completely reduced.  Patient without any significant tenderness.  No  concerns for strangulation.  Feel that a lot of the hernia was probably omental fat.  Patient's hernia open now palpable at the superior aspect of the scar right above his umbilicus.  Opening probably measuring about 3 cm so it is fairly small.  Patient nontoxic no acute distress.  Final Clinical Impressions(s) / ED Diagnoses   Final diagnoses:  Ventral hernia, recurrent    ED Discharge Orders    None       Fredia Sorrow, MD 01/01/19 1014

## 2019-01-01 NOTE — ED Notes (Signed)
Waiting for ride home

## 2019-01-01 NOTE — ED Notes (Signed)
Patient is resting comfortably. 

## 2019-01-01 NOTE — ED Notes (Signed)
Patient is resting comfortably, eyes closed, resp even and unlabored

## 2019-01-01 NOTE — ED Notes (Signed)
Placed in elastic abd binder, from home. Waiting on ride

## 2020-01-16 ENCOUNTER — Ambulatory Visit: Payer: BC Managed Care – PPO | Attending: Internal Medicine

## 2020-01-16 DIAGNOSIS — Z20822 Contact with and (suspected) exposure to covid-19: Secondary | ICD-10-CM

## 2020-01-17 LAB — NOVEL CORONAVIRUS, NAA: SARS-CoV-2, NAA: NOT DETECTED

## 2021-11-26 ENCOUNTER — Other Ambulatory Visit: Payer: Self-pay

## 2021-11-26 ENCOUNTER — Emergency Department (HOSPITAL_BASED_OUTPATIENT_CLINIC_OR_DEPARTMENT_OTHER): Payer: BC Managed Care – PPO

## 2021-11-26 ENCOUNTER — Encounter (HOSPITAL_BASED_OUTPATIENT_CLINIC_OR_DEPARTMENT_OTHER): Payer: Self-pay | Admitting: Emergency Medicine

## 2021-11-26 ENCOUNTER — Inpatient Hospital Stay (HOSPITAL_BASED_OUTPATIENT_CLINIC_OR_DEPARTMENT_OTHER)
Admission: EM | Admit: 2021-11-26 | Discharge: 2021-12-05 | DRG: 166 | Disposition: A | Discharge status: Home / Self Care | Payer: BC Managed Care – PPO | Attending: Internal Medicine | Admitting: Internal Medicine

## 2021-11-26 DIAGNOSIS — Z8546 Personal history of malignant neoplasm of prostate: Secondary | ICD-10-CM

## 2021-11-26 DIAGNOSIS — E871 Hypo-osmolality and hyponatremia: Secondary | ICD-10-CM | POA: Diagnosis present

## 2021-11-26 DIAGNOSIS — C3491 Malignant neoplasm of unspecified part of right bronchus or lung: Secondary | ICD-10-CM

## 2021-11-26 DIAGNOSIS — C3490 Malignant neoplasm of unspecified part of unspecified bronchus or lung: Secondary | ICD-10-CM | POA: Diagnosis not present

## 2021-11-26 DIAGNOSIS — C7972 Secondary malignant neoplasm of left adrenal gland: Secondary | ICD-10-CM | POA: Diagnosis present

## 2021-11-26 DIAGNOSIS — D649 Anemia, unspecified: Secondary | ICD-10-CM | POA: Diagnosis present

## 2021-11-26 DIAGNOSIS — Z9079 Acquired absence of other genital organ(s): Secondary | ICD-10-CM | POA: Diagnosis not present

## 2021-11-26 DIAGNOSIS — E222 Syndrome of inappropriate secretion of antidiuretic hormone: Secondary | ICD-10-CM | POA: Diagnosis present

## 2021-11-26 DIAGNOSIS — L271 Localized skin eruption due to drugs and medicaments taken internally: Secondary | ICD-10-CM | POA: Diagnosis not present

## 2021-11-26 DIAGNOSIS — G588 Other specified mononeuropathies: Secondary | ICD-10-CM | POA: Diagnosis present

## 2021-11-26 DIAGNOSIS — C3411 Malignant neoplasm of upper lobe, right bronchus or lung: Principal | ICD-10-CM | POA: Diagnosis present

## 2021-11-26 DIAGNOSIS — R531 Weakness: Secondary | ICD-10-CM

## 2021-11-26 DIAGNOSIS — R490 Dysphonia: Secondary | ICD-10-CM | POA: Diagnosis present

## 2021-11-26 DIAGNOSIS — R918 Other nonspecific abnormal finding of lung field: Secondary | ICD-10-CM

## 2021-11-26 DIAGNOSIS — C7931 Secondary malignant neoplasm of brain: Secondary | ICD-10-CM | POA: Diagnosis present

## 2021-11-26 DIAGNOSIS — E119 Type 2 diabetes mellitus without complications: Secondary | ICD-10-CM | POA: Diagnosis present

## 2021-11-26 DIAGNOSIS — F17210 Nicotine dependence, cigarettes, uncomplicated: Secondary | ICD-10-CM | POA: Diagnosis present

## 2021-11-26 DIAGNOSIS — C7971 Secondary malignant neoplasm of right adrenal gland: Secondary | ICD-10-CM | POA: Diagnosis present

## 2021-11-26 DIAGNOSIS — T402X5A Adverse effect of other opioids, initial encounter: Secondary | ICD-10-CM | POA: Diagnosis not present

## 2021-11-26 DIAGNOSIS — G936 Cerebral edema: Secondary | ICD-10-CM | POA: Diagnosis present

## 2021-11-26 DIAGNOSIS — C786 Secondary malignant neoplasm of retroperitoneum and peritoneum: Secondary | ICD-10-CM | POA: Diagnosis present

## 2021-11-26 DIAGNOSIS — K5900 Constipation, unspecified: Secondary | ICD-10-CM

## 2021-11-26 DIAGNOSIS — I10 Essential (primary) hypertension: Secondary | ICD-10-CM | POA: Diagnosis present

## 2021-11-26 DIAGNOSIS — Z8616 Personal history of COVID-19: Secondary | ICD-10-CM | POA: Diagnosis not present

## 2021-11-26 DIAGNOSIS — R059 Cough, unspecified: Secondary | ICD-10-CM

## 2021-11-26 DIAGNOSIS — C61 Malignant neoplasm of prostate: Secondary | ICD-10-CM

## 2021-11-26 DIAGNOSIS — K5909 Other constipation: Secondary | ICD-10-CM | POA: Insufficient documentation

## 2021-11-26 LAB — URINALYSIS, ROUTINE W REFLEX MICROSCOPIC
Bilirubin Urine: NEGATIVE
Glucose, UA: NEGATIVE mg/dL
Hgb urine dipstick: NEGATIVE
Ketones, ur: NEGATIVE mg/dL
Leukocytes,Ua: NEGATIVE
Nitrite: NEGATIVE
Protein, ur: NEGATIVE mg/dL
Specific Gravity, Urine: 1.02 (ref 1.005–1.030)
pH: 6 (ref 5.0–8.0)

## 2021-11-26 LAB — CBC WITH DIFFERENTIAL/PLATELET
Abs Immature Granulocytes: 0.02 10*3/uL (ref 0.00–0.07)
Basophils Absolute: 0 10*3/uL (ref 0.0–0.1)
Basophils Relative: 0 %
Eosinophils Absolute: 0.1 10*3/uL (ref 0.0–0.5)
Eosinophils Relative: 1 %
HCT: 34.3 % — ABNORMAL LOW (ref 39.0–52.0)
Hemoglobin: 12 g/dL — ABNORMAL LOW (ref 13.0–17.0)
Immature Granulocytes: 0 %
Lymphocytes Relative: 8 %
Lymphs Abs: 0.7 10*3/uL (ref 0.7–4.0)
MCH: 31.7 pg (ref 26.0–34.0)
MCHC: 35 g/dL (ref 30.0–36.0)
MCV: 90.5 fL (ref 80.0–100.0)
Monocytes Absolute: 0.7 10*3/uL (ref 0.1–1.0)
Monocytes Relative: 8 %
Neutro Abs: 7.2 10*3/uL (ref 1.7–7.7)
Neutrophils Relative %: 83 %
Platelets: 247 10*3/uL (ref 150–400)
RBC: 3.79 MIL/uL — ABNORMAL LOW (ref 4.22–5.81)
RDW: 13.3 % (ref 11.5–15.5)
WBC: 8.7 10*3/uL (ref 4.0–10.5)
nRBC: 0 % (ref 0.0–0.2)

## 2021-11-26 LAB — COMPREHENSIVE METABOLIC PANEL
ALT: 15 U/L (ref 0–44)
AST: 27 U/L (ref 15–41)
Albumin: 3.7 g/dL (ref 3.5–5.0)
Alkaline Phosphatase: 69 U/L (ref 38–126)
Anion gap: 9 (ref 5–15)
BUN: 12 mg/dL (ref 8–23)
CO2: 23 mmol/L (ref 22–32)
Calcium: 8.8 mg/dL — ABNORMAL LOW (ref 8.9–10.3)
Chloride: 86 mmol/L — ABNORMAL LOW (ref 98–111)
Creatinine, Ser: 0.43 mg/dL — ABNORMAL LOW (ref 0.61–1.24)
GFR, Estimated: >60 mL/min (ref >60)
Glucose, Bld: 119 mg/dL — ABNORMAL HIGH (ref 70–99)
Potassium: 4.2 mmol/L (ref 3.5–5.1)
Sodium: 118 mmol/L — CL (ref 135–145)
Total Bilirubin: 0.6 mg/dL (ref 0.3–1.2)
Total Protein: 7.1 g/dL (ref 6.5–8.1)

## 2021-11-26 LAB — RESP PANEL BY RT-PCR (FLU A&B, COVID) ARPGX2
Influenza A by PCR: NEGATIVE
Influenza B by PCR: NEGATIVE
SARS Coronavirus 2 by RT PCR: POSITIVE — AB

## 2021-11-26 LAB — PSA: Prostatic Specific Antigen: 0.02 ng/mL (ref 0.00–4.00)

## 2021-11-26 LAB — LIPASE, BLOOD: Lipase: 114 U/L — ABNORMAL HIGH (ref 11–51)

## 2021-11-26 LAB — CBG MONITORING, ED: Glucose-Capillary: 129 mg/dL — ABNORMAL HIGH (ref 70–99)

## 2021-11-26 LAB — SODIUM: Sodium: 117 mmol/L — CL (ref 135–145)

## 2021-11-26 LAB — MAGNESIUM: Magnesium: 1.6 mg/dL — ABNORMAL LOW (ref 1.7–2.4)

## 2021-11-26 IMAGING — CT CT HEAD W/O CM
3 series · 14 of 47 positions shown, 16 images · non-contrast
Comparison: None.

CLINICAL DATA: Dizziness and disequilibrium. Symptoms for 1 month.
COVID 3 weeks ago. Prostate cancer with new lung mass.

EXAM:
CT HEAD WITHOUT CONTRAST
TECHNIQUE: Contiguous axial images were obtained from the base of the skull
through the vertex without intravenous contrast.

[Series 3: coronal soft · coronal · 0.39mm/px · 3 of 77 slices shown]
[im 26/77  brain]
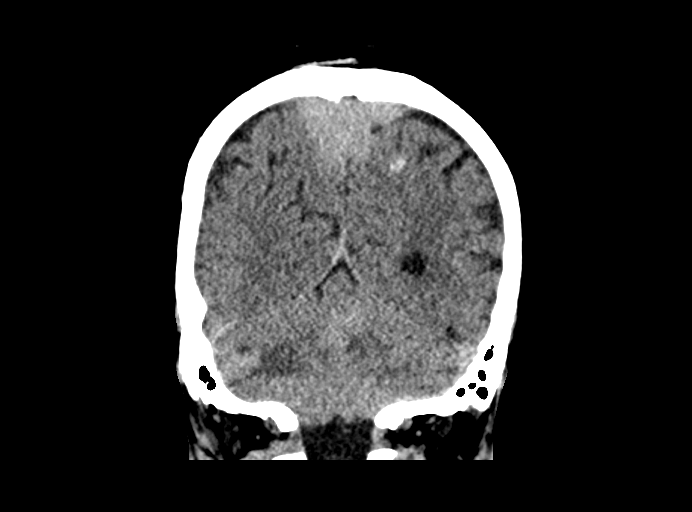
[im 34/77  brain]
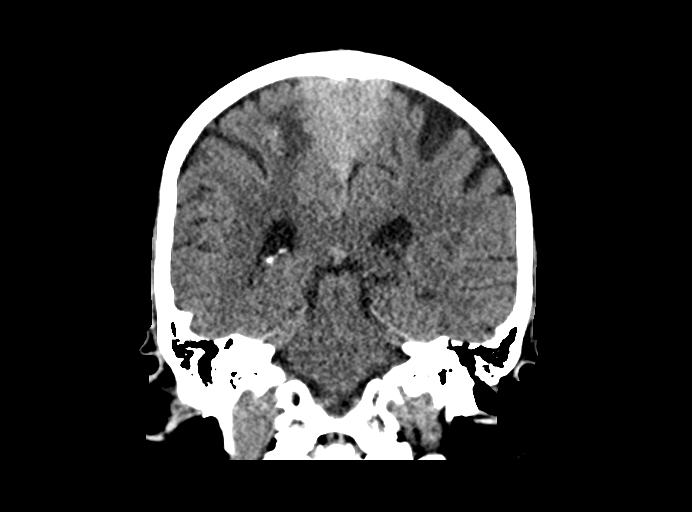
[im 43/77  brain]
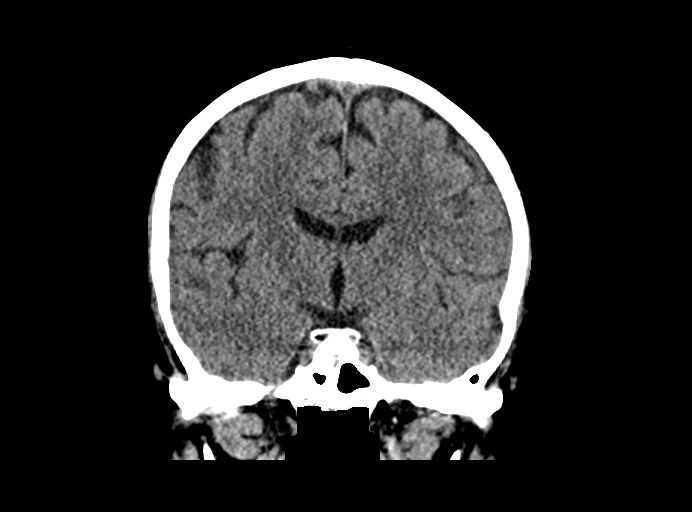

[Series 4: sag soft · sagittal · 0.35mm/px · 3 of 57 slices shown]
[im 19/57  brain]
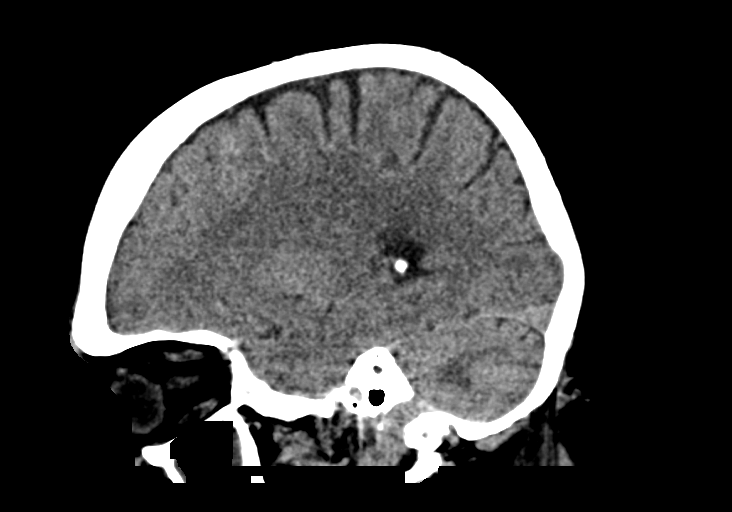
[im 29/57  brain]
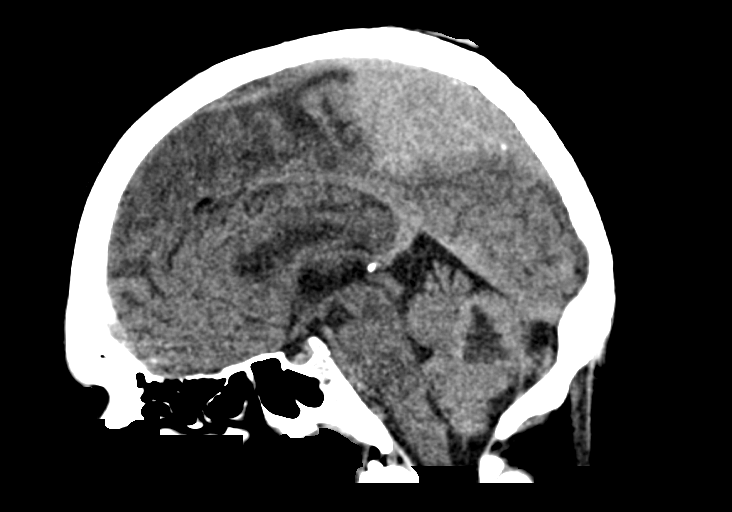
[im 38/57  brain]
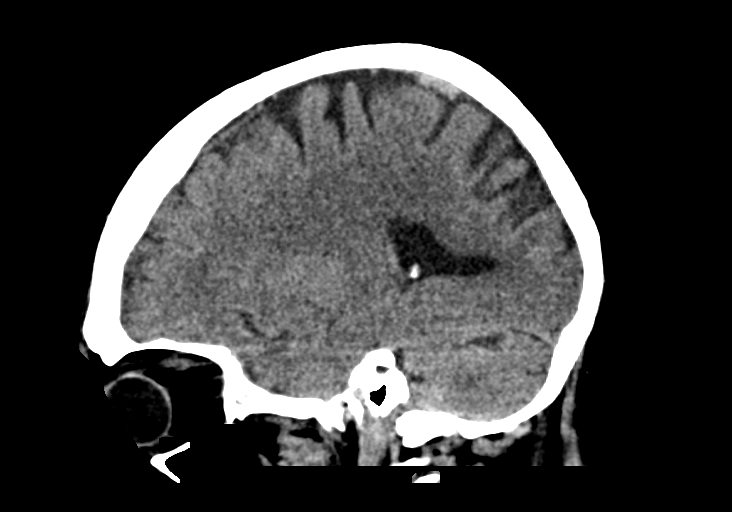

[Series 5: head wo · axial · 0.50mm/px · z∈[-203,-68]mm · 8 of 33 slices shown, 10 images]
[im 3/33  brain]
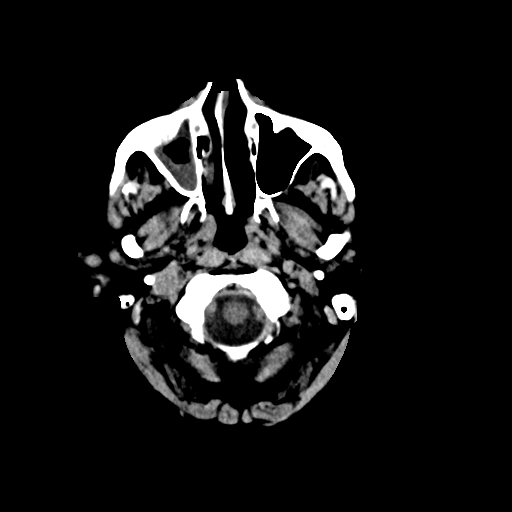
[im 3/33  bone]
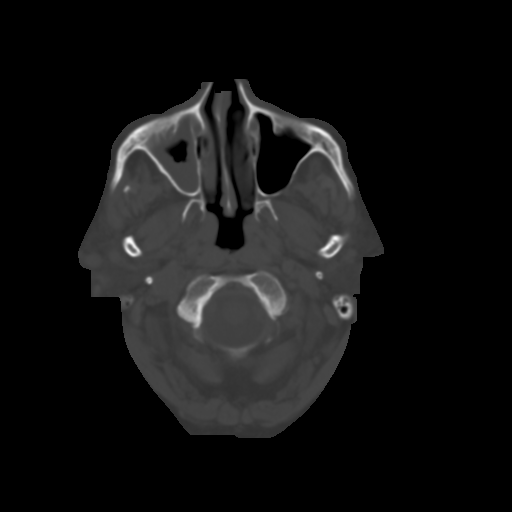
[im 7/33  brain]
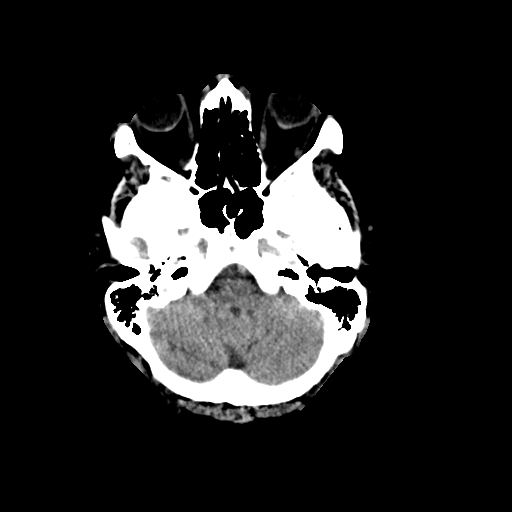
[im 10/33  brain]
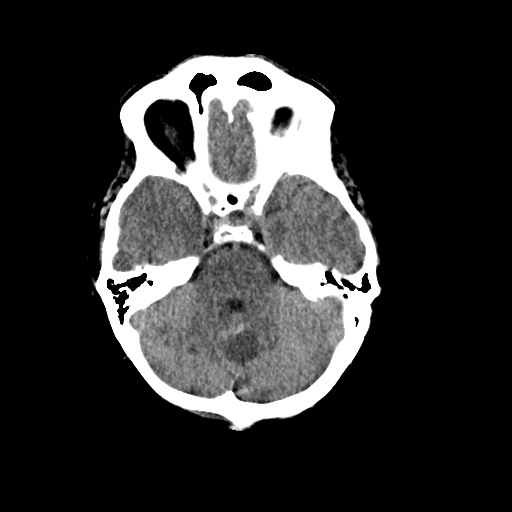
[im 15/33  brain]
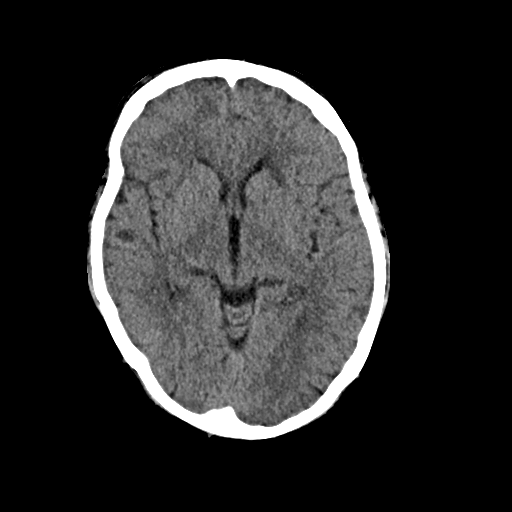
[im 18/33  brain]
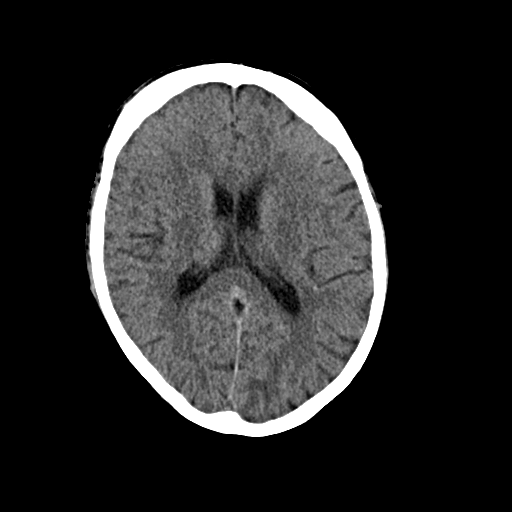
[im 18/33  bone]
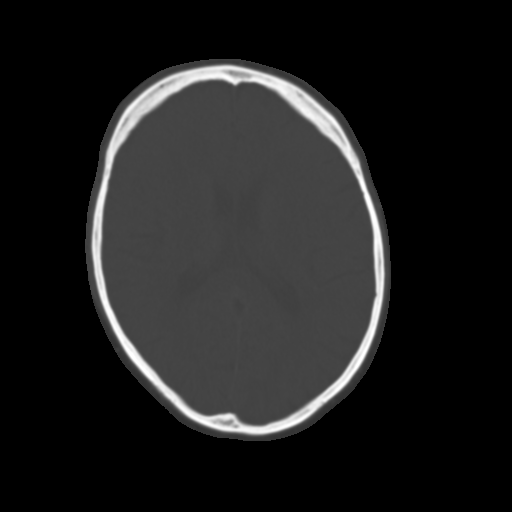
[im 23/33  brain]
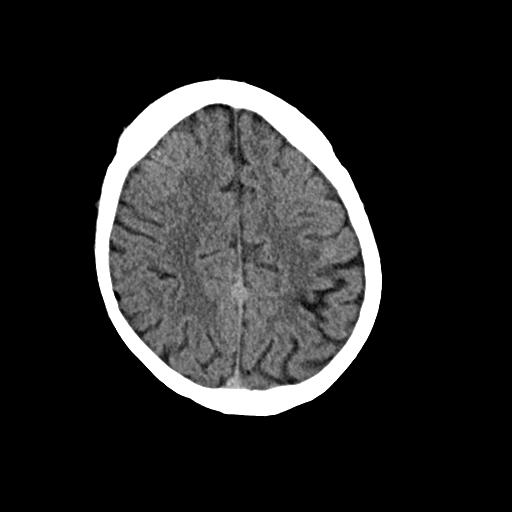
[im 26/33  brain]
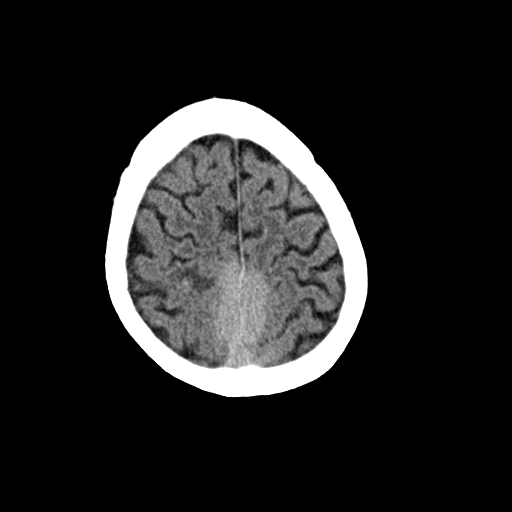
[im 30/33  brain]
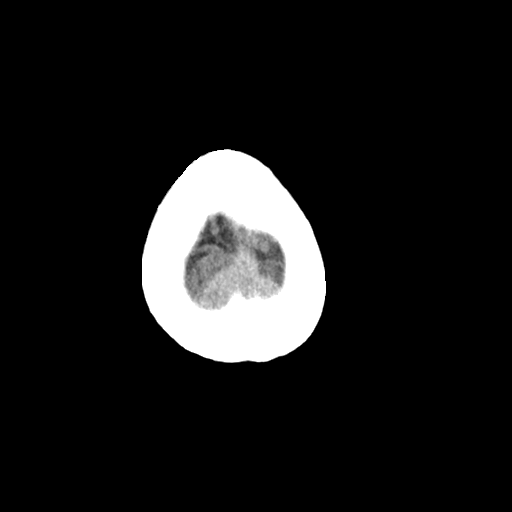

[14 of 47 positions shown; findings below may reference images not displayed]

FINDINGS: Brain: A midline extra-axial hyperdense mass lesion surrounds the
superior sagittal sinus. The lesion measures 5.9 x 3.5 x 4.4 cm. An
area of hypoattenuation is present in the posterior right frontal
lobe adjacent to the lesion. There is mass effect on the posterior
frontal and parietal lobes bilaterally.

Separate hyperdense focus is present in the cortical or subcortical
left parietal lobe on axial image 24 of series 5. Similar
subcortical hyperdensity is present in the left occipital lobe on
axial image 17 of series 5. Areas of hypoattenuation are present in
the cerebellum. The largest is in midline with some surrounding
hyperdensity, also concerning for a metastatic lesion. It measures
2.2 x 2.1 cm on saddle sagittal image 29 of series 4. Smaller
hypodense left cystic lesions the cerebellum are also concerning
metastases. Infarct is considered less likely.

A hypodense lesion in the right temporal lobe measures 11 mm.

The ventricles are of normal size.  No significant fluid is present

Vascular: No hyperdense vessel or unexpected calcification.

Skull: Calvarium is intact. No focal lytic or blastic lesions are
present. No significant extracranial soft tissue lesion is present.

Sinuses/Orbits: Right maxillary sinus opacification is likely
chronic. There is some wall thickening. Minimal air is present in
the sinus. The paranasal sinuses and mastoid air cells are otherwise
clear. The globes and orbits are within normal limits.
IMPRESSION: 1. 5.9 x 3.5 x 4.4 cm midline extra-axial hyperdense mass lesion
surrounds the superior sagittal sinus. This most likely represents a
metastasis. Meningioma is considered less likely.
2. Other hyperdense lesions are present in the cortical or
subcortical left parietal lobe, left occipital lobe, and right
temporal lobe concerning for metastatic disease.
3. There is mass effect on the posterior frontal and parietal lobes
bilaterally.
4. Recommend MRI the brain without and contrast for further
evaluation of these lesions.
5. Chronic right maxillary sinus disease.

These results were called by telephone at the time of interpretation
on [DATE] at [DATE] to provider Dr. CALMO, who verbally
acknowledged these results.

## 2021-11-26 MED ORDER — LACTATED RINGERS IV BOLUS
1000.0000 mL | Freq: Once | INTRAVENOUS | Status: AC
  Administered 2021-11-26: 1000 mL via INTRAVENOUS

## 2021-11-26 MED ORDER — LORAZEPAM 2 MG/ML IJ SOLN
1.0000 mg | Freq: Once | INTRAMUSCULAR | Status: AC
  Administered 2021-11-26: 1 mg via INTRAVENOUS
  Filled 2021-11-26: qty 1

## 2021-11-26 MED ORDER — LORAZEPAM 2 MG/ML IJ SOLN
1.0000 mg | Freq: Once | INTRAMUSCULAR | Status: DC
  Filled 2021-11-26: qty 1

## 2021-11-26 MED ORDER — DEXAMETHASONE SODIUM PHOSPHATE 4 MG/ML IJ SOLN
4.0000 mg | Freq: Four times a day (QID) | INTRAMUSCULAR | Status: DC
  Administered 2021-11-26 – 2021-11-27 (×3): 4 mg via INTRAVENOUS
  Filled 2021-11-26 (×3): qty 1

## 2021-11-26 MED ORDER — ORAL CARE MOUTH RINSE
15.0000 mL | Freq: Two times a day (BID) | OROMUCOSAL | Status: DC
  Administered 2021-11-28 – 2021-11-29 (×3): 15 mL via OROMUCOSAL

## 2021-11-26 MED ORDER — CHLORHEXIDINE GLUCONATE 0.12 % MT SOLN
15.0000 mL | Freq: Two times a day (BID) | OROMUCOSAL | Status: DC
  Administered 2021-11-27 – 2021-11-30 (×5): 15 mL via OROMUCOSAL
  Filled 2021-11-26 (×5): qty 15

## 2021-11-26 MED ORDER — DEXAMETHASONE SODIUM PHOSPHATE 4 MG/ML IJ SOLN: 4.0000 mg | Freq: Four times a day (QID) | INTRAMUSCULAR | Status: DC

## 2021-11-26 MED ORDER — DEXAMETHASONE SODIUM PHOSPHATE 10 MG/ML IJ SOLN
10.0000 mg | Freq: Once | INTRAMUSCULAR | Status: AC
  Administered 2021-11-26: 10 mg via INTRAVENOUS
  Filled 2021-11-26: qty 1

## 2021-11-26 MED ORDER — IOHEXOL 300 MG/ML  SOLN
100.0000 mL | Freq: Once | INTRAMUSCULAR | Status: AC | PRN
  Administered 2021-11-26: 100 mL via INTRAVENOUS

## 2021-11-26 MED ORDER — CHLORHEXIDINE GLUCONATE CLOTH 2 % EX PADS
6.0000 | MEDICATED_PAD | Freq: Every day | CUTANEOUS | Status: DC
  Administered 2021-11-27 – 2021-12-05 (×9): 6 via TOPICAL

## 2021-11-26 MED ORDER — NICOTINE 21 MG/24HR TD PT24
21.0000 mg | MEDICATED_PATCH | Freq: Once | TRANSDERMAL | Status: AC
  Administered 2021-11-26: 21 mg via TRANSDERMAL
  Filled 2021-11-26: qty 1

## 2021-11-26 NOTE — ED Triage Notes (Signed)
Lower extremities weakness x 3 weeks , covid + 3 weeks ago. Weight loss 20 lbs. Headache . Takes iron for anemia . Poor appetite since covid .

## 2021-11-26 NOTE — ED Notes (Signed)
[facility] at bedside 

## 2021-11-26 NOTE — Progress Notes (Signed)
Patient arrived from [facility]. Paged admission coordinator.

## 2021-11-26 NOTE — ED Provider Notes (Signed)
[facility] DEPARTMENT Provider Note   CSN: [id number] Arrival date & time: 11/26/21  7035     History Chief Complaint  Patient presents with   Weakness    (+Covid)    Ryan [name] is a 65 y.o. male with history of prostate cancer treated with total prostatectomy and radiation who presents with concern for 3 weeks of progressively worsening weakness, loss of appetite, and 20 pound weight loss.  Patient was diagnosed with COVID-19 just over 3 weeks ago; states he has lost all sense of taste and has had no appetite since that time.  His wife at the bedside states that she is only seen him eat solid food once in the last several days.  He presents today due to concerns for multiple episodes of severe dizziness and disequilibrium.  He states that this morning he was only able to walk when leaning up against the wall; states that he did not have any sensation that the room was spinning or that he was swaying, but was unable to stand upright.  States that he feels like both of his legs are weak but denies any numbness, tingling in his legs or any saddle anesthesia.  He continues to pass flatus but has not had a bowel movement in several days.  He is urinating normally.  Additionally he is having new headaches.Additionally has been having at least one episode of NBNB emesis daily x 2 weeks. No melena or hematochezia.  According to the patient and chart review he had a chest x-ray yesterday in the outpatient setting and had concern for right upper lobe opacity versus obstructive perihilar mass; he did undergo a CT scan of the chest yesterday but this is not visible in his chart and there are no results have been communicated to the patient and his family.  I have personally reviewed this patient's medical record.  His history of diabetes, ileus, prostate cancer and hernia repair.  He is on metformin and NovoLog daily but has not been taking his NovoLog secondary to poor p.o. intake.   He is not anticoagulated.    HPI     Past Medical History:  Diagnosis Date   Ascites    Diabetes mellitus without complication ([place])    Hernia of abdominal cavity    Ileus ([place])    Prostate cancer [facility])     Patient Active Problem List   Diagnosis Date Noted   Acute hyponatremia 11/26/2021    Past Surgical History:  Procedure Laterality Date   PROSTATE SURGERY         No family history on file.  Social History   Tobacco Use   Smoking status: Every Day    Packs/day: 0.50    Types: Cigarettes   Smokeless tobacco: Never  Substance Use Topics   Alcohol use: Yes    Comment: occ   Drug use: No    Home Medications Prior to Admission medications   Medication Sig Start Date End Date Taking? Authorizing Provider  Elastic Bandages & Supports (ABDOMINAL BINDER/ELASTIC 2XL) MISC 1 Units by Does not apply route daily. 05/25/16   Palumbo, April, MD  insulin aspart (NOVOLOG) 100 UNIT/ML injection Inject into the skin 3 (three) times daily before meals.    [provider]  lisinopril (PRINIVIL,ZESTRIL) 40 MG tablet Take 40 mg by mouth daily.    [provider]  metFORMIN (GLUCOPHAGE) 1000 MG tablet Take 1,000 mg by mouth 2 (two) times daily with a meal.  [provider]  ondansetron (ZOFRAN ODT) 8 MG disintegrating tablet 8mg  ODT q8 hours prn nausea 05/25/16   Palumbo, April, MD  traMADol (ULTRAM) 50 MG tablet Take 1 tablet (50 mg total) by mouth every 6 (six) hours as needed. 01/01/19   Fredia Sorrow, MD    Allergies    Patient has no known allergies.  Review of Systems   Review of Systems  Constitutional:  Positive for activity change, appetite change and fatigue. Negative for chills.  HENT: Negative.    Eyes:  Negative for photophobia and visual disturbance.  Respiratory:  Positive for cough and shortness of breath.   Cardiovascular: Negative.   Gastrointestinal:  Positive for constipation, nausea and vomiting. Negative for abdominal pain.   Genitourinary: Negative.   Musculoskeletal:  Positive for gait problem. Negative for joint swelling, neck pain and neck stiffness.  Skin:  Positive for pallor. Negative for rash.  Neurological:  Positive for dizziness, weakness, light-headedness and headaches. Negative for seizures, syncope, facial asymmetry, speech difficulty and numbness.   Physical Exam Updated Vital Signs BP 118/71   Pulse 83   Temp 98 F (36.7 C) (Oral)   Resp 12   Ht 6\' 2"  (1.88 m)   Wt 106.6 kg   SpO2 93%   BMI 30.17 kg/m   Physical Exam Vitals and nursing note reviewed.  Constitutional:      Appearance: He is obese. He is not toxic-appearing.  HENT:     Head: Normocephalic and atraumatic.     Nose: Nose normal. No congestion.     Mouth/Throat:     Mouth: Mucous membranes are moist.     Pharynx: Oropharynx is clear. Uvula midline. No oropharyngeal exudate or posterior oropharyngeal erythema.     Tonsils: No tonsillar exudate.  Eyes:     General: Lids are normal. Vision grossly intact.        Right eye: No discharge.        Left eye: No discharge.     Extraocular Movements: Extraocular movements intact.     Conjunctiva/sclera: Conjunctivae normal.     Pupils: Pupils are equal, round, and reactive to light.  Neck:     Trachea: Trachea and phonation normal.  Cardiovascular:     Rate and Rhythm: Normal rate and regular rhythm.     Pulses: Normal pulses.     Heart sounds: Normal heart sounds.  Pulmonary:     Effort: Pulmonary effort is normal. No tachypnea, bradypnea, accessory muscle usage, prolonged expiration or respiratory distress.     Breath sounds: Normal breath sounds. No wheezing or rales.     Comments: Coarse breath sound in the right upper lobe only Chest:     Chest wall: No mass, lacerations, deformity, swelling, tenderness, crepitus or edema.  Abdominal:     General: Bowel sounds are decreased. There is no distension.     Palpations: Abdomen is soft.     Tenderness: There is no  abdominal tenderness. There is no right CVA tenderness, left CVA tenderness, guarding or rebound.  Musculoskeletal:        General: No deformity.     Cervical back: Normal range of motion and neck supple. No edema, rigidity, tenderness or crepitus. No pain with movement, spinous process tenderness or muscular tenderness.     Right lower leg: 1+ Edema present.     Left lower leg: 1+ Edema present.  Lymphadenopathy:     Cervical: No cervical adenopathy.  Skin:    General: Skin is warm  and dry.     Capillary Refill: Capillary refill takes less than 2 seconds.     Findings: No rash.  Neurological:     General: No focal deficit present.     Mental Status: He is alert and oriented to person, place, and time. Mental status is at baseline.     GCS: GCS eye subscore is 4. GCS verbal subscore is 5. GCS motor subscore is 6.     Sensory: Sensation is intact.     Motor: Motor function is intact.     Coordination: Coordination is intact.     Gait: Gait is intact.  Psychiatric:        Mood and Affect: Mood normal.    ED Results / Procedures / Treatments   Labs (all labs ordered are listed, but only abnormal results are displayed) Labs Reviewed  RESP PANEL BY RT-PCR (FLU A&B, COVID) ARPGX2 - Abnormal; Notable for the following components:      Result Value   SARS Coronavirus 2 by RT PCR POSITIVE (*)    All other components within normal limits  CBC WITH DIFFERENTIAL/PLATELET - Abnormal; Notable for the following components:   RBC 3.79 (*)    Hemoglobin 12.0 (*)    HCT 34.3 (*)    All other components within normal limits  COMPREHENSIVE METABOLIC PANEL - Abnormal; Notable for the following components:   Sodium 118 (*)    Chloride 86 (*)    Glucose, Bld 119 (*)    Creatinine, Ser 0.43 (*)    Calcium 8.8 (*)    All other components within normal limits  LIPASE, BLOOD - Abnormal; Notable for the following components:   Lipase 114 (*)    All other components within normal limits  MAGNESIUM  - Abnormal; Notable for the following components:   Magnesium 1.6 (*)    All other components within normal limits  SODIUM - Abnormal; Notable for the following components:   Sodium 117 (*)    All other components within normal limits  CBG MONITORING, ED - Abnormal; Notable for the following components:   Glucose-Capillary 129 (*)    All other components within normal limits  URINALYSIS, ROUTINE W REFLEX MICROSCOPIC  PSA    EKG EKG Interpretation  Date/Time:  Wednesday November 26 2021 09:17:03 EST Ventricular Rate:  94 PR Interval:  195 QRS Duration: 83 QT Interval:  346 QTC Calculation: 433 R Axis:   19 Text Interpretation: Sinus rhythm Probable left atrial enlargement No significant change since prior 6/17 Confirmed by Aletta Edouard [phone]) on 11/26/2021 9:52:45 AM  Radiology CT Head Wo Contrast  Result Date: 11/26/2021 CLINICAL DATA:  Dizziness and disequilibrium. Symptoms for 1 month. COVID 3 weeks ago. Prostate cancer with new lung mass. EXAM: CT HEAD WITHOUT CONTRAST TECHNIQUE: Contiguous axial images were obtained from the base of the skull through the vertex without intravenous contrast. COMPARISON:  None. FINDINGS: Brain: A midline extra-axial hyperdense mass lesion surrounds the superior sagittal sinus. The lesion measures 5.9 x 3.5 x 4.4 cm. An area of hypoattenuation is present in the posterior right frontal lobe adjacent to the lesion. There is mass effect on the posterior frontal and parietal lobes bilaterally. Separate hyperdense focus is present in the cortical or subcortical left parietal lobe on axial image 24 of series 5. Similar subcortical hyperdensity is present in the left occipital lobe on axial image 17 of series 5. Areas of hypoattenuation are present in the cerebellum. The largest is in midline with some surrounding  hyperdensity, also concerning for a metastatic lesion. It measures 2.2 x 2.1 cm on saddle sagittal image 29 of series 4. Smaller hypodense left  cystic lesions the cerebellum are also concerning metastases. Infarct is considered less likely. A hypodense lesion in the right temporal lobe measures 11 mm. The ventricles are of normal size.  No significant fluid is present Vascular: No hyperdense vessel or unexpected calcification. Skull: Calvarium is intact. No focal lytic or blastic lesions are present. No significant extracranial soft tissue lesion is present. Sinuses/Orbits: Right maxillary sinus opacification is likely chronic. There is some wall thickening. Minimal air is present in the sinus. The paranasal sinuses and mastoid air cells are otherwise clear. The globes and orbits are within normal limits. IMPRESSION: 1. 5.9 x 3.5 x 4.4 cm midline extra-axial hyperdense mass lesion surrounds the superior sagittal sinus. This most likely represents a metastasis. Meningioma is considered less likely. 2. Other hyperdense lesions are present in the cortical or subcortical left parietal lobe, left occipital lobe, and right temporal lobe concerning for metastatic disease. 3. There is mass effect on the posterior frontal and parietal lobes bilaterally. 4. Recommend MRI the brain without and contrast for further evaluation of these lesions. 5. Chronic right maxillary sinus disease. These results were called by telephone at the time of interpretation on 11/26/2021 at 1:30 pm to provider Dr. Melina Copa, who verbally acknowledged these results. Electronically Signed   By: San Morelle M.D.   On: 11/26/2021 13:46   CT CHEST ABDOMEN PELVIS W CONTRAST  Result Date: 11/26/2021 CLINICAL DATA:  Persistent cough, shortness of breath EXAM: CT CHEST, ABDOMEN, AND PELVIS WITH CONTRAST TECHNIQUE: Multidetector CT imaging of the chest, abdomen and pelvis was performed following the standard protocol during bolus administration of intravenous contrast. CONTRAST:  176mL OMNIPAQUE IOHEXOL 300 MG/ML  SOLN COMPARISON:  Previous studies including chest radiographs done on  11/24/2021 FINDINGS: CT CHEST FINDINGS Cardiovascular: Coronary artery calcifications are seen. There is homogeneous enhancement in thoracic aorta. There are no intraluminal filling defects in central pulmonary artery branches. There is extrinsic compression of right main pulmonary artery caused by pathologically enlarged lymph nodes in mediastinum and right hilum. There is extrinsic pressure over the superior vena cava caused by lymphadenopathy. Mediastinum/Nodes: There is bulky lymphadenopathy in the mediastinum extending to the right hilum measuring approximately 8.6 x 8.6 x 9.5 cm. There is 2.6 x 1.9 cm node in the left hilum. Lungs/Pleura: Centrilobular emphysema is seen. In image 54 of series 4, there is 2.9 x 2.4 cm pleural-based noncalcified nodule in the right upper lobe. There are increased interstitial markings in the right upper lobe. In the image 71, there is 12 mm nodular density in the right upper lobe. In the image 73, there is 6 mm nodule in the right upper lobe. There are 3 small nodular densities each measuring less than 9 mm in size in the right lower lobe. Musculoskeletal: Unremarkable. CT ABDOMEN PELVIS FINDINGS Hepatobiliary: Liver measures 17.5 cm in length. In the image 58 of series 2, there is 1.6 cm fluid density lesion in the left lobe, possibly cyst. There are other scattered low-density foci in the right lobe some of which may be cysts. Possibility of space-occupying neoplastic process is not excluded. There are slightly enlarged lymph nodes anterior to the liver and adjacent to the pericardium largest measuring 2.7 x 1.7 cm. There is no dilation of bile ducts. Gallbladder is not distended. Pancreas: Head of the pancreas is prominent in size. There is no dilation of  pancreatic duct. No focal abnormality is seen in the body and tail of pancreas. Spleen: Spleen is not enlarged. Adrenals/Urinary Tract: There is 3 cm nodule in the left adrenal. There is 2 cm nodule in the right adrenal.  There is no hydronephrosis. There are no renal or ureteral stones. There are smooth marginated nodular densities adjacent to the kidneys largest measuring 3.6 cm posterior to the upper pole of right kidney. Density measurements in this lesions are higher than usual for simple cysts. Ureters not dilated. Urinary bladder is not distended. Stomach/Bowel: Stomach is unremarkable. Small bowel loops are not dilated. Appendix is not dilated. There is no significant wall thickening in colon. Vascular/Lymphatic: There are scattered atherosclerotic plaques and calcifications in the aorta and its major branches. There are pathologically enlarged lymph nodes in the mesentery and retroperitoneum. Largest of these nodes is noted anterior to the transverse colon measuring 5.9 x 3 cm. There are multiple other smaller nodules of varying sizes in the abdomen in the mesentery and retroperitoneum. Reproductive: Prostate is not enlarged. There is fluid density in the the region of prostatic urethra. This may suggest previous prostate surgery. Other: In image 76 of series 2, there is a 12 mm subcutaneous nodule in the right anterior abdominal wall. Small bilateral inguinal hernias containing fat are seen. Musculoskeletal: Unremarkable. IMPRESSION: There is 2.9 cm lobulated noncalcified pleural-based nodule in the right upper lobe suggesting possible primary malignant neoplasm. There are pathologically enlarged bulky lymph nodes in the mediastinum and both hilar regions, more so on the right side suggesting metastatic lymphadenopathy. There is extrinsic pressure over the right main pulmonary artery and superior vena cava by the enlarged lymph nodes in mediastinum. Increased interstitial markings and small scattered nodules are seen in the right lung which may be part of pneumonia or pulmonary metastatic disease. There are nodular densities in both adrenals largest measuring 3 cm in the left adrenal suggesting possible metastatic disease.  There are enlarged lymph nodes in the retroperitoneum and mesentery largest measuring 5.9 cm in maximum diameter suggesting metastatic lymphadenopathy. There are multiple cysts in the liver. There are other indeterminate low-density lesions of varying sizes in the liver. Possibility of hepatic metastatic disease is not excluded. Head of the pancreas is enlarged in size with lobulated margins. Possibility of neoplastic process in the head of the pancreas is not excluded. Follow-up PET-CT and biopsy as warranted should be considered. Coronary artery calcifications are seen. Other findings as described in the body of the report. Electronically Signed   By: Elmer Picker M.D.   On: 11/26/2021 13:49    Procedures .Critical Care Performed by: Emeline Darling, PA-C Authorized by: Emeline Darling, PA-C   Critical care provider statement:    Critical care time (minutes):  60   Critical care was time spent personally by me on the following activities:  Development of treatment plan with patient or surrogate, discussions with consultants, evaluation of patient's response to treatment, examination of patient, obtaining history from patient or surrogate, ordering and performing treatments and interventions, ordering and review of laboratory studies, ordering and review of radiographic studies, pulse oximetry and re-evaluation of patient's condition   Medications Ordered in ED Medications  nicotine (NICODERM CQ - dosed in mg/24 hours) patch 21 mg (21 mg Transdermal Patch Applied 11/26/21 1451)  LORazepam (ATIVAN) injection 1 mg (0 mg Intravenous Hold 11/26/21 1550)  dexamethasone (DECADRON) injection 4 mg (has no administration in time range)  lactated ringers bolus 1,000 mL (0 mLs Intravenous Stopped  11/26/21 1232)  iohexol (OMNIPAQUE) 300 MG/ML solution 100 mL (100 mLs Intravenous Contrast Given 11/26/21 1302)  LORazepam (ATIVAN) injection 1 mg (1 mg Intravenous Given 11/26/21 1452)   dexamethasone (DECADRON) injection 10 mg (10 mg Intravenous Given 11/26/21 1555)    ED Course  I have reviewed the triage vital signs and the nursing notes.  Pertinent labs & imaging results that were available during my care of the patient were reviewed by me and considered in my medical decision making (see chart for details).  Clinical Course as of 11/26/21 1801  Wed Nov 26, 2969  3045 65 year old male here for feeling generally weak after recent COVID infection.  Poor p.o. intake.  He does not have any gross focal findings although has a very odd affect anxious and pressured.  Labs showing markedly abnormal sodium of 118.  Getting IV fluids.  Will need admission to the hospital for further management. [MB]  9678 Received a call from radiology that they see multiple lesions in his head.  They think this is likely metastases. [MB]  9381 Consult to oncology, Dr. Alen Blew who agrees with plan for medical admission and recommends soft tissue biopsy during admission.  Per oncologist, doubt recurrent prostate cancer, fevers new primary malignancy. I appreciate his collaboration in the care of this patient.  [RS]  1522 Consult to neurosurgery, Dr. Ronnald Ramp, who states that this patient's mass will not be resectable.  He does not favor anticonvulsant prophylactic medication at this time but does recommend single dose of 10 mg of IV Decadron and then 4 mg every 6 hours moving forward.  I appreciate his collaboration in the care of this patient. [RS]    Clinical Course User Index [MB] Hayden Rasmussen, MD [RS] Layten Aiken, Sharlene Dory   MDM Rules/Calculators/A&P                         65 year old male presents with concern for weakness, confusion.  The differential diagnosis of weakness includes but is not limited to: Neurologic causes: GBS, myasthenia gravis, CVA, MS, ALS, transverse myelitis, spinal cord injury, CVA, botulism Other causes: ACS, Arrhythmia, syncope, orthostatic hypotension,  sepsis, hypoglycemia, electrolyte disturbance, hypothyroidism, respiratory failure, symptomatic anemia, dehydration, heat injury, polypharmacy, malignancy.  Vital signs are normal on intake with the exception of mild tachypnea.  Cardiopulmonary exam is unremarkable , abdominal exam is with decreased bowel sounds but otherwise unremarkable.  Neurologic exam is without focal deficit.  Patient does appear quite anxious, very fidgety in the room, difficult to redirect.  CBC with mild anemia with hemoglobin of 12, otherwise unremarkable.  CMP with hyponatremia of 118 with only very mild hyperglycemia of 119.  Renal and hepatic function unremarkable.  Magnesium is mildly low, 1.6.  Lipase is elevated to 114.  Patient did receive a liter bolus prior to resulting of sodium.  Recheck after fluid bolus revealed persistent hyponatremia of 117 at this time.  PSA is normal.  UA is unremarkable.  Respiratory panel positive for COVID-19, however, Positive COVID-19 test verified on patient's phone in the ED, tested positive on 11/12/21.   Unfortunately CT imaging confirmed for new intracranial masses the largest of which is approximately 6 x 3.5 x 4 cm involving the sagittal sinus.  Additionally patient has multiple areas of concerning malignancy in the chest and abdomen including the right upper lobe, bilateral adrenal glands, retroperitoneal and mesenteric lymphadenopathy. After discussion with oncology there is concern for possible new lung primary malignancy.  Discussion with neurosurgery as above with recommendation for steroids; no anticonvulsant prophylactic warranted at this time per neurosurgery recommendation.  MRI is ordered as well as MRV per neurosurgery recommendation.  Discussion with hospitalist regarding this patient's unfortunate course in the emergency department.  He has been admitted to the stepdown unit at [facility] and will be transferred to soon as a bed is available for him.  Kristine  continues to be quite agitated and altered in his mental status.  Ativan offered and patient is sleeping at this time.  As patient clinically appears euvolemic, will hold off in any further sodium supplementation at this time, per discussion with hospitalist Dr. Lorin Mercy.  Giovanie's partner voiced understanding of his medical evaluation and treatment plan.  Each of her questions was answered to her expressed satisfaction.  The patient and his partner are amenable to plan for admission at this time.  This chart was dictated using voice recognition software, Dragon. Despite the best efforts of this provider to proofread and correct errors, errors may still occur which can change documentation meaning.  Final Clinical Impression(s) / ED Diagnoses Final diagnoses:  None    Rx / DC Orders ED Discharge Orders     None        Aura Dials 11/26/21 1801    Hayden Rasmussen, MD 11/26/21 1845

## 2021-11-26 NOTE — Progress Notes (Signed)
Received a phone call from Facility: [facility]  Requesting MD: Melina Copa Patient with h/o DM and prostate CA s/p prostatectomy presenting with LE weakness.  Weakness, personality changes - anxiety, confused.  Weak, difficulty walking.  Na++ 118, +n/v.  No seizure-like activity.  Normal renal/hepatic function.  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  No seizure prophylaxis recommended.  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  Adrenal mets and diffuse LAD.  +mass effect on frontal lobe.  Given steroids.  Oncology will see.  Will need goals of care discussions, palliative care.  ?role for rad onc. Plan of care: Will need admission to SDU at [facility].    Nursing staff, Please call the [place] number at the top of Amion at the time of the patient's arrival so that the patient can be paged to the admitting physician.   Carlyon Shadow, M.D. [facility]

## 2021-11-26 NOTE — ED Notes (Signed)
ED Provider at bedside. 

## 2021-11-26 NOTE — ED Notes (Signed)
Report given to [facility] with [facility]

## 2021-11-27 ENCOUNTER — Encounter (HOSPITAL_COMMUNITY): Payer: Self-pay | Admitting: Internal Medicine

## 2021-11-27 ENCOUNTER — Inpatient Hospital Stay (HOSPITAL_COMMUNITY): Payer: BC Managed Care – PPO

## 2021-11-27 ENCOUNTER — Other Ambulatory Visit: Payer: Self-pay | Admitting: Radiation Therapy

## 2021-11-27 ENCOUNTER — Ambulatory Visit
Admit: 2021-11-27 | Discharge: 2021-11-27 | Disposition: A | Discharge status: Home / Self Care | Payer: BC Managed Care – PPO | Source: Ambulatory Visit | Attending: Radiation Oncology | Admitting: Radiation Oncology

## 2021-11-27 DIAGNOSIS — E119 Type 2 diabetes mellitus without complications: Secondary | ICD-10-CM | POA: Diagnosis not present

## 2021-11-27 DIAGNOSIS — C3490 Malignant neoplasm of unspecified part of unspecified bronchus or lung: Secondary | ICD-10-CM | POA: Diagnosis not present

## 2021-11-27 DIAGNOSIS — C7931 Secondary malignant neoplasm of brain: Secondary | ICD-10-CM

## 2021-11-27 DIAGNOSIS — E871 Hypo-osmolality and hyponatremia: Secondary | ICD-10-CM

## 2021-11-27 DIAGNOSIS — R531 Weakness: Secondary | ICD-10-CM

## 2021-11-27 DIAGNOSIS — C3491 Malignant neoplasm of unspecified part of right bronchus or lung: Secondary | ICD-10-CM

## 2021-11-27 LAB — GLUCOSE, CAPILLARY
Glucose-Capillary: 190 mg/dL — ABNORMAL HIGH (ref 70–99)
Glucose-Capillary: 155 mg/dL — ABNORMAL HIGH (ref 70–99)
Glucose-Capillary: 173 mg/dL — ABNORMAL HIGH (ref 70–99)
Glucose-Capillary: 146 mg/dL — ABNORMAL HIGH (ref 70–99)

## 2021-11-27 LAB — BASIC METABOLIC PANEL
Anion gap: 10 (ref 5–15)
Anion gap: 9 (ref 5–15)
Anion gap: 10 (ref 5–15)
Anion gap: 9 (ref 5–15)
BUN: 11 mg/dL (ref 8–23)
BUN: 12 mg/dL (ref 8–23)
BUN: 14 mg/dL (ref 8–23)
BUN: 16 mg/dL (ref 8–23)
CO2: 20 mmol/L — ABNORMAL LOW (ref 22–32)
CO2: 23 mmol/L (ref 22–32)
CO2: 24 mmol/L (ref 22–32)
CO2: 25 mmol/L (ref 22–32)
Calcium: 9 mg/dL (ref 8.9–10.3)
Calcium: 8.9 mg/dL (ref 8.9–10.3)
Calcium: 9.3 mg/dL (ref 8.9–10.3)
Calcium: 9.4 mg/dL (ref 8.9–10.3)
Chloride: 90 mmol/L — ABNORMAL LOW (ref 98–111)
Chloride: 89 mmol/L — ABNORMAL LOW (ref 98–111)
Chloride: 89 mmol/L — ABNORMAL LOW (ref 98–111)
Chloride: 90 mmol/L — ABNORMAL LOW (ref 98–111)
Creatinine, Ser: 0.37 mg/dL — ABNORMAL LOW (ref 0.61–1.24)
Creatinine, Ser: 0.45 mg/dL — ABNORMAL LOW (ref 0.61–1.24)
Creatinine, Ser: 0.56 mg/dL — ABNORMAL LOW (ref 0.61–1.24)
Creatinine, Ser: 0.61 mg/dL (ref 0.61–1.24)
GFR, Estimated: >60 mL/min (ref >60)
GFR, Estimated: >60 mL/min (ref >60)
GFR, Estimated: >60 mL/min (ref >60)
GFR, Estimated: >60 mL/min (ref >60)
Glucose, Bld: 149 mg/dL — ABNORMAL HIGH (ref 70–99)
Glucose, Bld: 180 mg/dL — ABNORMAL HIGH (ref 70–99)
Glucose, Bld: 187 mg/dL — ABNORMAL HIGH (ref 70–99)
Glucose, Bld: 151 mg/dL — ABNORMAL HIGH (ref 70–99)
Potassium: 4.1 mmol/L (ref 3.5–5.1)
Potassium: 4.3 mmol/L (ref 3.5–5.1)
Potassium: 4 mmol/L (ref 3.5–5.1)
Potassium: 4.7 mmol/L (ref 3.5–5.1)
Sodium: 120 mmol/L — ABNORMAL LOW (ref 135–145)
Sodium: 121 mmol/L — ABNORMAL LOW (ref 135–145)
Sodium: 123 mmol/L — ABNORMAL LOW (ref 135–145)
Sodium: 124 mmol/L — ABNORMAL LOW (ref 135–145)

## 2021-11-27 LAB — SODIUM, URINE, RANDOM: Sodium, Ur: 38 mmol/L

## 2021-11-27 LAB — HIV ANTIBODY (ROUTINE TESTING W REFLEX): HIV Screen 4th Generation wRfx: NONREACTIVE

## 2021-11-27 LAB — HEMOGLOBIN A1C
Hgb A1c MFr Bld: 5.9 % — ABNORMAL HIGH (ref 4.8–5.6)
Mean Plasma Glucose: 122.63 mg/dL

## 2021-11-27 LAB — OSMOLALITY, URINE: Osmolality, Ur: 343 mOsm/kg (ref 300–900)

## 2021-11-27 LAB — MRSA NEXT GEN BY PCR, NASAL: MRSA by PCR Next Gen: NOT DETECTED

## 2021-11-27 IMAGING — MR MR [PERSON_NAME] HEAD
3 of 8 series · 29 of 48 positions shown · IV contrast (GADAVIST)
Comparison: Prior CT from [DATE].

CLINICAL DATA: Initial evaluation for brain mass.

EXAM:
MRI HEAD WITHOUT AND WITH CONTRAST
MRV HEAD WITHOUT CONTRAST
TECHNIQUE: Multiplanar, multiecho pulse sequences of the brain and surrounding
structures were obtained without and with intravenous contrast.
Angiographic images of the intracranial venous structures were
obtained using MRV technique without intravenous contrast.
CONTRAST:  10mL GADAVIST GADOBUTROL 1 MMOL/ML IV SOLN

[Series 1: cor 2d · coronal · 2.5mm · 0.69mm/px · 7 of 128 slices shown]
[im 1/128]
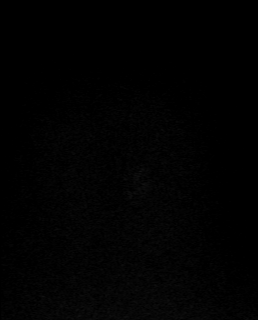
[im 22/128]
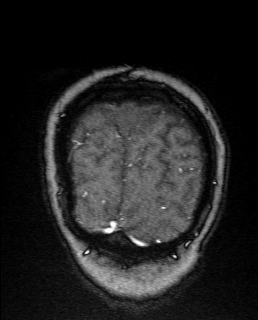
[im 43/128]
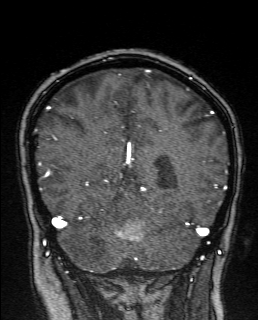
[im 64/128]
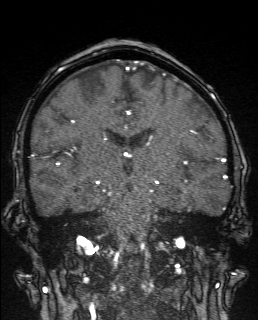
[im 85/128]
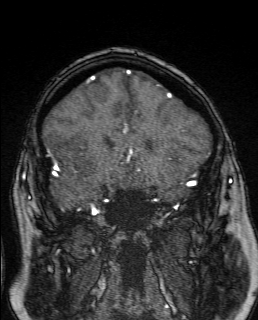
[im 106/128]
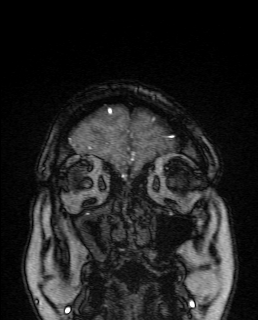
[im 128/128]
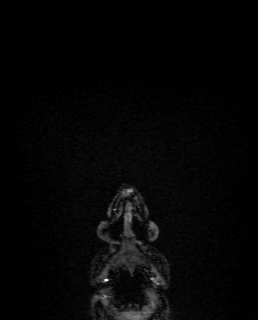

[Series 12: T1 post-contrast · sagittal · 1.0mm · 0.94mm/px · 10 of 160 slices shown (1 of 2)]
[im 1/160]
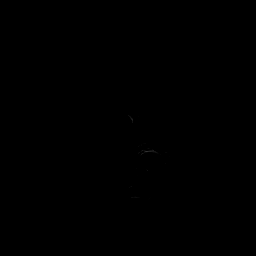
[im 18/160]
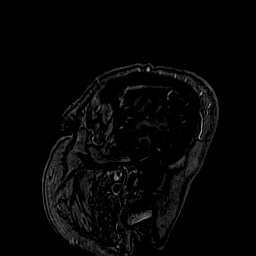
[im 36/160]
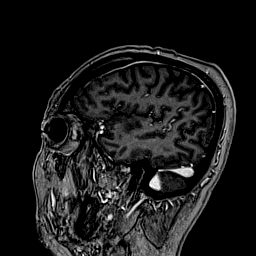
[im 54/160]
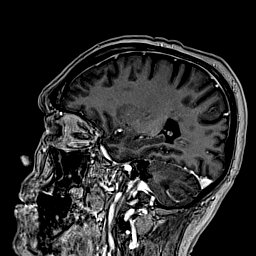
[im 71/160]
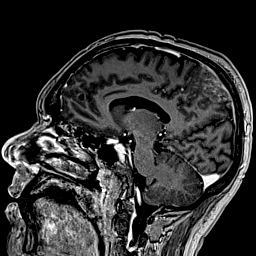
[im 89/160]
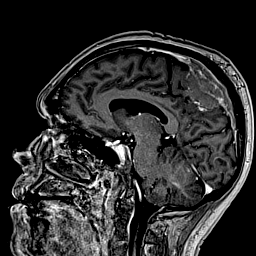
[im 107/160]
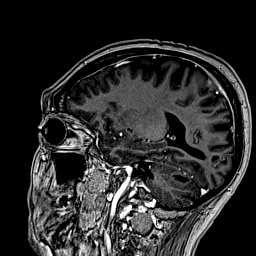
[im 124/160]
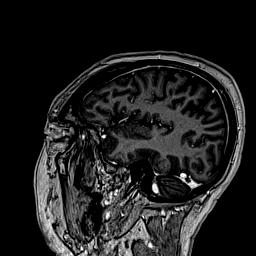
[im 142/160]
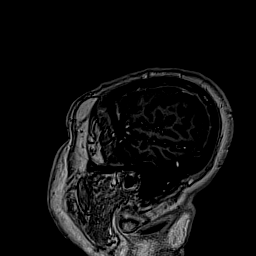
[im 160/160]
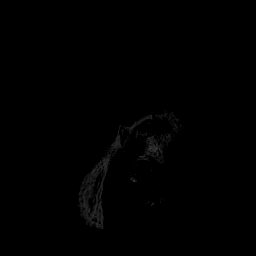

[Series 13: T1 post-contrast · coronal · 1.0mm · 0.94mm/px · 12 of 196 slices shown (2 of 2)]
[im 1/196]
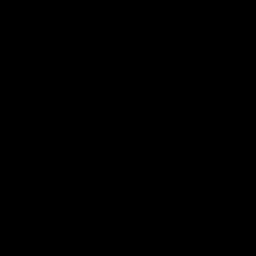
[im 18/196]
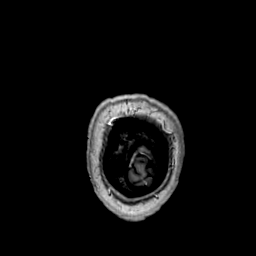
[im 36/196]
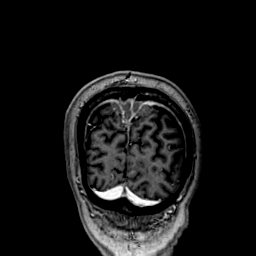
[im 54/196]
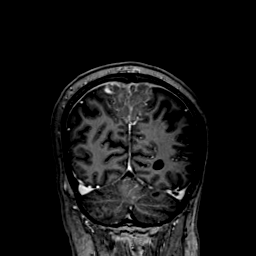
[im 71/196]
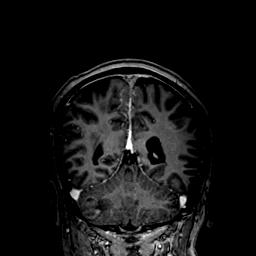
[im 89/196]
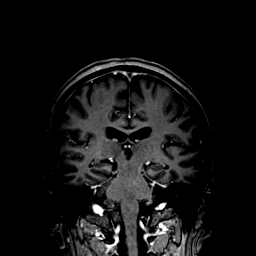
[im 107/196]
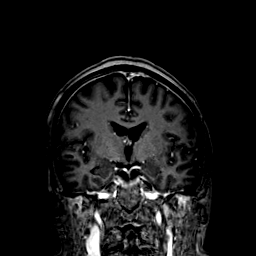
[im 125/196]
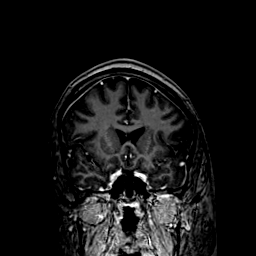
[im 142/196]
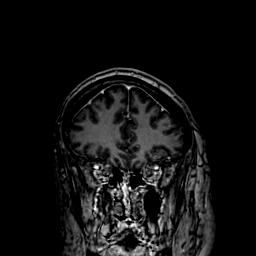
[im 160/196]
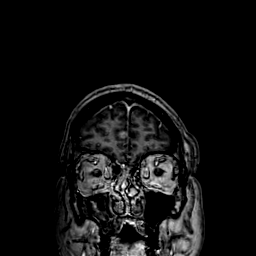
[im 178/196]
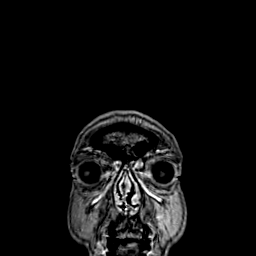
[im 196/196]
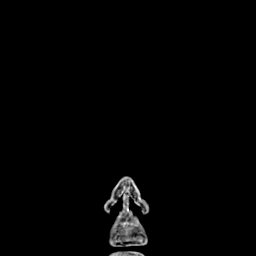

[29 of 48 positions shown; findings below may reference images not displayed]

FINDINGS: MRI HEAD FINDINGS:

Brain: Cerebral volume within normal limits.

Multiple scattered intraparenchymal lesions are seen involving both
cerebral hemispheres as well as the cerebellum, consistent with
widespread metastatic disease. There are at least 20 lesions. For
reference purposes, the largest lesion within the right cerebral
hemisphere is present at the right temporal lobe in measures 1.5 x
1.2 x 0.7 cm (series 26, image 22). The largest lesion within the
left cerebral hemisphere is positioned at the left occipital pole
and measures 1.5 x 1.7 x 1.7 cm (series 26, image 21). Largest
distinct infratentorial lesion is seen within the central cerebellum
and measures 2.1 x 2.2 x 2.5 cm (series 26, image 12). Several of
these lesions are somewhat cystic in appearance. Few lesions
demonstrate intrinsic T1 hyperintensity and/or SWI signal,
consistent with hemorrhage and/or blood products, most notable at
the central cerebellum (series 11, image 11). Mild localized edema
about several of these lesions without significant regional mass
effect.

An additional large enhancing extra-axial mass positioned at the
posterior aspect of the superior sagittal sinus measures 3.2 x 6.6 x
4.8 cm (series 28, image 13). Scattered areas of internal
susceptibility artifact consistent with blood products. Mild
localized vasogenic edema without significant mass effect. This
lesion is somewhat indeterminate, and could reflect an additional
dural-based metastasis. Concomitant meningioma could also be
considered. This lesion invades and obliterates the adjacent
superior sagittal sinus, better evaluated on concomitant MRV. Mild
diffuse smooth pachymeningeal thickening and enhancement is likely
secondary.

No definite evidence for acute or subacute ischemia. Note made of a
punctate focus of diffusion abnormality at the right basal ganglia,
favored to reflect a small metastasis rather than infarct (series 5,
image 75). No midline shift or hydrocephalus. No other extra-axial
fluid collection. Pituitary gland suprasellar region within normal
limits.

Vascular: Major intracranial arterial vascular flow voids are
maintained.

Skull and upper cervical spine: Craniocervical junction within
normal limits. Heterogeneous signal intensity seen within the
visualized bone marrow without focal marrow replacing lesion. No
scalp soft tissue abnormality.

Sinuses/Orbits: Globes within normal limits. Increased CSF signal
intensity seen along the optic nerve sheaths bilaterally, suggesting
elevated intracranial pressures. Orbital soft tissues otherwise
unremarkable. Moderate mucosal thickening within the right maxillary
sinus. Paranasal sinuses are otherwise largely clear. Small right
with trace left mastoid effusions.

Other: None.

MRV HEAD FINDINGS:

The above described dural based mass again noted at the posterior
aspect of the superior sagittal sinus. The mass invades and
obliterates the superior sagittal sinus at this level (series 1,
image 98). Tubular filling defect is seen within the superior
sagittal sinus anterior to the mass, likely a small amount of
nonocclusive thrombus (series 13, images 88, 102). Superior sagittal
sinus otherwise patent anteriorly. Additional extension of the
lesion to involve the posterior aspect of the suture superior
sagittal sinus as it courses towards the torcula (series 12, image
82) the torcula itself is patent. Transverse and sigmoid sinuses are
patent as are the visualized proximal internal jugular veins.
Straight sinus, vein of ATO, inferior sagittal sinus, internal
cerebral veins, and basal veins of ATO are patent. No
abnormality about the cavernous sinus. Superior orbital veins
symmetric and within normal limits. No appreciable cortical venous
thrombosis.
IMPRESSION: MRI HEAD IMPRESSION:

1. Widespread intracranial metastatic disease involving both
cerebral hemispheres and cerebellum as above. Mild localized edema
with hemorrhagic blood products about several of these lesions
without significant regional mass effect or midline shift.
2. 3.2 x 6.6 x 4.8 cm enhancing extra-axial mass at the posterior
aspect of the superior sagittal sinus, indeterminate. While this
finding could reflect an additional dural-based metastasis, a
possible concomitant meningioma could also be considered.

MRV HEAD IMPRESSION:

1. Invasion and obliteration of the posterior superior sagittal
sinus by the above described dural based mass. Small amount of
additional nonocclusive thrombus within the superior sagittal sinus
anterior to the mass as above.
2. Remainder of the major dural sinuses are otherwise patent.

## 2021-11-27 IMAGING — MR MR HEAD WO/W CM
12 of 13 series · 41 of 48 positions shown · IV contrast (GADAVIST)
Comparison: Prior CT from [DATE].

CLINICAL DATA: Initial evaluation for brain mass.

EXAM:
MRI HEAD WITHOUT AND WITH CONTRAST
MRV HEAD WITHOUT CONTRAST
TECHNIQUE: Multiplanar, multiecho pulse sequences of the brain and surrounding
structures were obtained without and with intravenous contrast.
Angiographic images of the intracranial venous structures were
obtained using MRV technique without intravenous contrast.
CONTRAST:  10mL GADAVIST GADOBUTROL 1 MMOL/ML IV SOLN

[Series 5: DWI · axial · 3.0mm · 1.36mm/px · z∈[-5,+131]mm · 8 of 96 slices shown (1 of 4)]
[im 1/96]
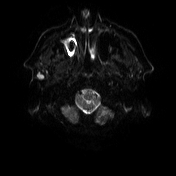
[im 14/96]
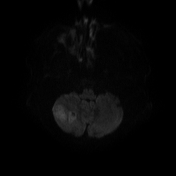
[im 28/96]
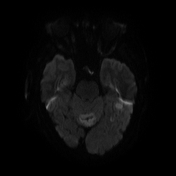
[im 41/96]
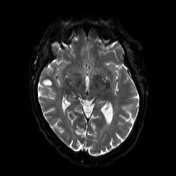
[im 55/96]
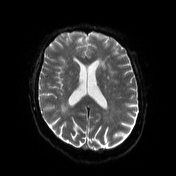
[im 68/96]
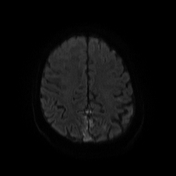
[im 82/96]
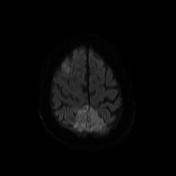
[im 96/96]
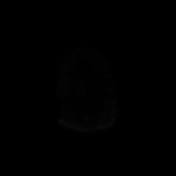

[Series 6: DWI · axial · 3.0mm · 1.36mm/px · z∈[-5,+131]mm · 4 of 48 slices shown (2 of 4)]
[im 1/48]
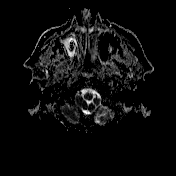
[im 16/48]
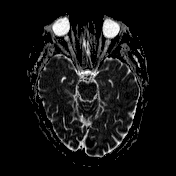
[im 32/48]
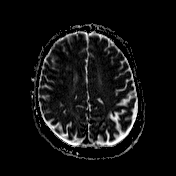
[im 48/48]
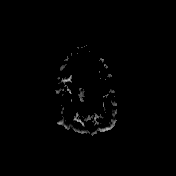

[Series 7: T2 · sagittal · 5.0mm · 0.47mm/px · 2 of 24 slices shown (1 of 3)]
[im 1/24]
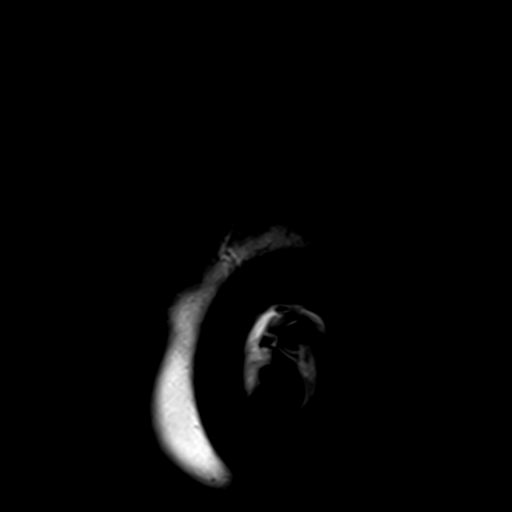
[im 24/24]
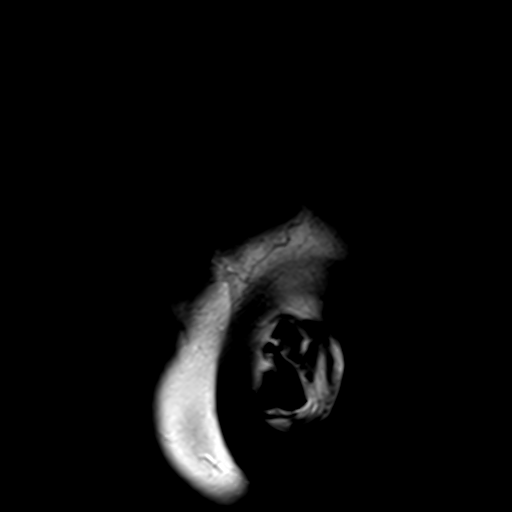

[Series 8: T2 · axial · 5.0mm · 0.45mm/px · z∈[-13,+143]mm · 2 of 26 slices shown (2 of 3)]
[im 1/26]
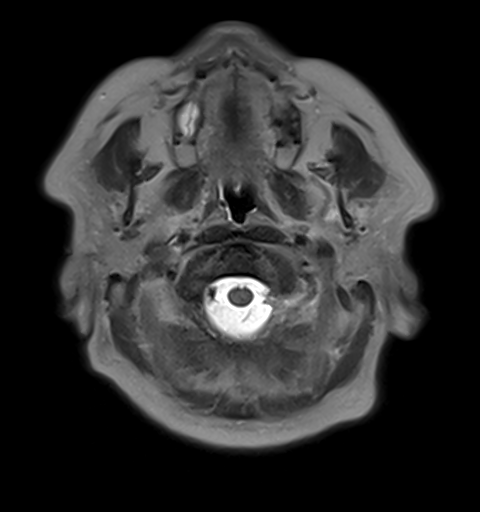
[im 26/26]
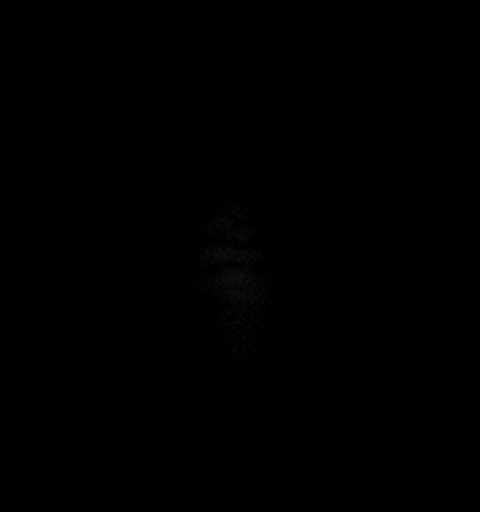

[Series 9: GRE · axial · 3.0mm · 0.47mm/px · 1 of 51 slices shown]
[im 1/51]
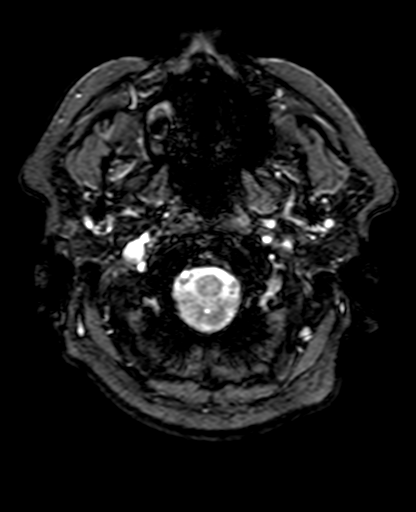

[Series 10: FLAIR · axial · 3.0mm · 0.94mm/px · z∈[-5,+139]mm · 4 of 51 slices shown]
[im 1/51]
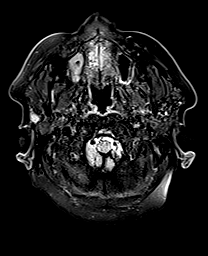
[im 17/51]
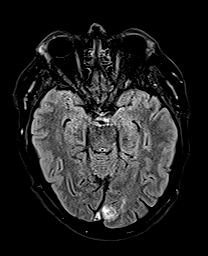
[im 34/51]
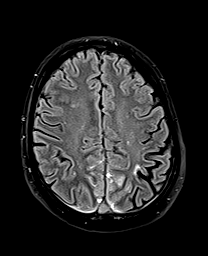
[im 51/51]
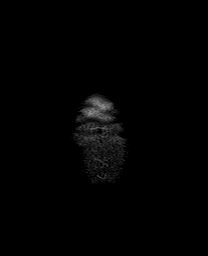

[Series 12: DWI · coronal · 5.0mm · 1.31mm/px · 5 of 64 slices shown (3 of 4)]
[im 1/64]
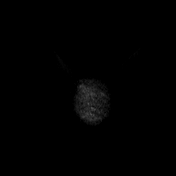
[im 16/64]
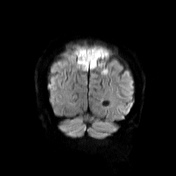
[im 32/64]
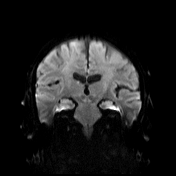
[im 48/64]
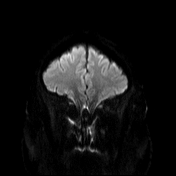
[im 64/64]
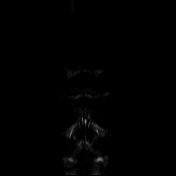

[Series 13: DWI · coronal · 5.0mm · 1.31mm/px · 3 of 32 slices shown (4 of 4)]
[im 1/32]
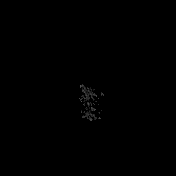
[im 16/32]
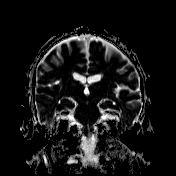
[im 32/32]
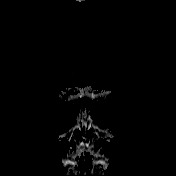

[Series 14: T2 · coronal · 5.0mm · 0.86mm/px · 3 of 32 slices shown (3 of 3)]
[im 1/32]
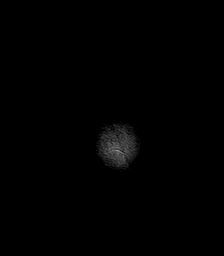
[im 16/32]
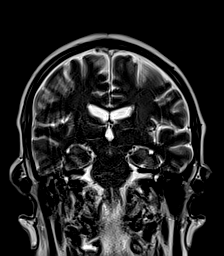
[im 32/32]
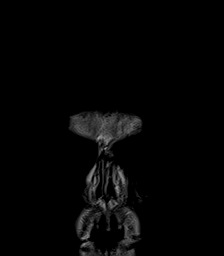

[Series 26: T1 post-contrast · axial · 3.0mm · 0.45mm/px · z∈[-23,+119]mm · 4 of 51 slices shown (1 of 3)]
[im 1/51]
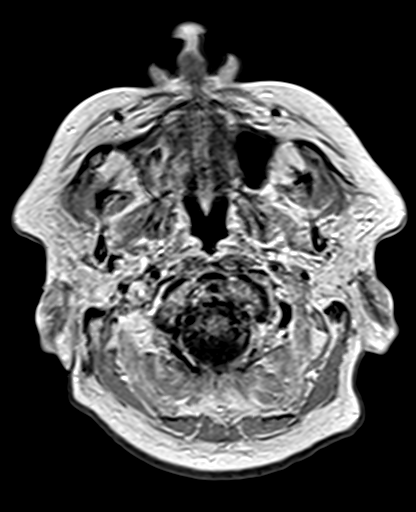
[im 17/51]
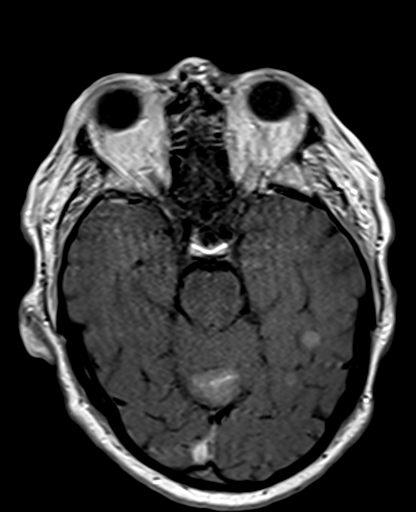
[im 34/51]
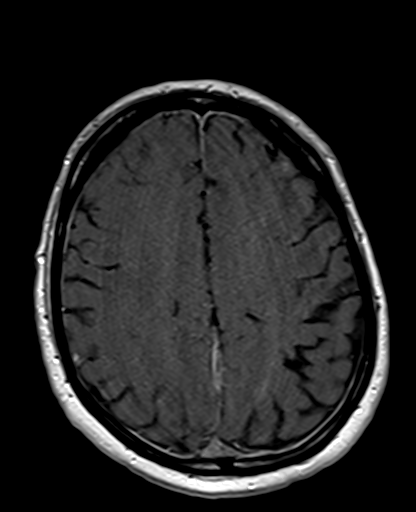
[im 51/51]
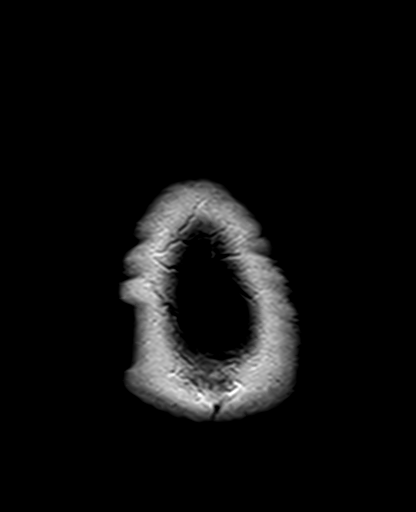

[Series 27: T1 post-contrast · coronal · 5.0mm · 0.43mm/px · 3 of 32 slices shown (2 of 3)]
[im 1/32]
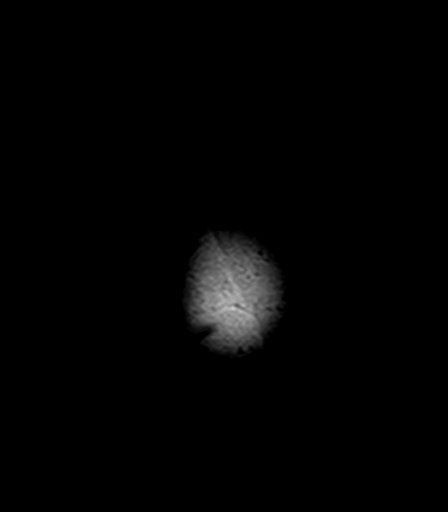
[im 16/32]
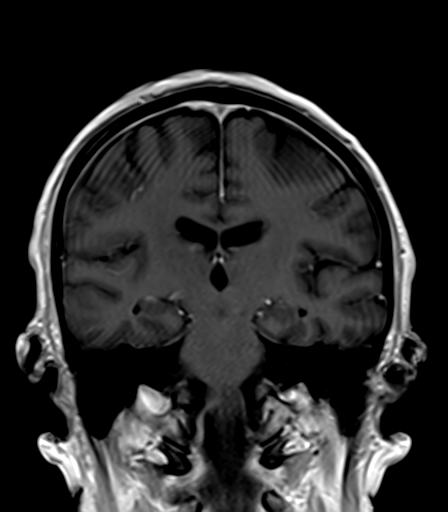
[im 32/32]
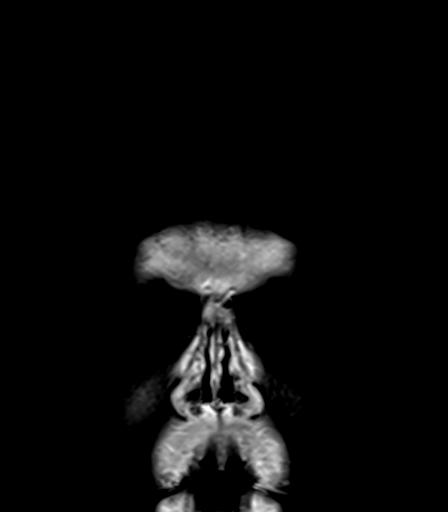

[Series 28: T1 post-contrast · sagittal · 5.0mm · 0.94mm/px · 2 of 23 slices shown (3 of 3)]
[im 1/23]
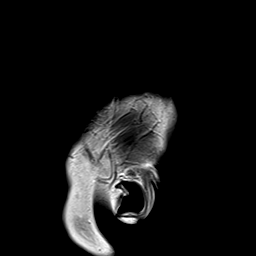
[im 23/23]
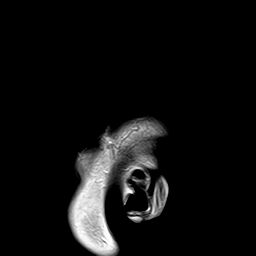

[41 of 48 positions shown; findings below may reference images not displayed]

FINDINGS: MRI HEAD FINDINGS:

Brain: Cerebral volume within normal limits.

Multiple scattered intraparenchymal lesions are seen involving both
cerebral hemispheres as well as the cerebellum, consistent with
widespread metastatic disease. There are at least 20 lesions. For
reference purposes, the largest lesion within the right cerebral
hemisphere is present at the right temporal lobe in measures 1.5 x
1.2 x 0.7 cm (series 26, image 22). The largest lesion within the
left cerebral hemisphere is positioned at the left occipital pole
and measures 1.5 x 1.7 x 1.7 cm (series 26, image 21). Largest
distinct infratentorial lesion is seen within the central cerebellum
and measures 2.1 x 2.2 x 2.5 cm (series 26, image 12). Several of
these lesions are somewhat cystic in appearance. Few lesions
demonstrate intrinsic T1 hyperintensity and/or SWI signal,
consistent with hemorrhage and/or blood products, most notable at
the central cerebellum (series 11, image 11). Mild localized edema
about several of these lesions without significant regional mass
effect.

An additional large enhancing extra-axial mass positioned at the
posterior aspect of the superior sagittal sinus measures 3.2 x 6.6 x
4.8 cm (series 28, image 13). Scattered areas of internal
susceptibility artifact consistent with blood products. Mild
localized vasogenic edema without significant mass effect. This
lesion is somewhat indeterminate, and could reflect an additional
dural-based metastasis. Concomitant meningioma could also be
considered. This lesion invades and obliterates the adjacent
superior sagittal sinus, better evaluated on concomitant MRV. Mild
diffuse smooth pachymeningeal thickening and enhancement is likely
secondary.

No definite evidence for acute or subacute ischemia. Note made of a
punctate focus of diffusion abnormality at the right basal ganglia,
favored to reflect a small metastasis rather than infarct (series 5,
image 75). No midline shift or hydrocephalus. No other extra-axial
fluid collection. Pituitary gland suprasellar region within normal
limits.

Vascular: Major intracranial arterial vascular flow voids are
maintained.

Skull and upper cervical spine: Craniocervical junction within
normal limits. Heterogeneous signal intensity seen within the
visualized bone marrow without focal marrow replacing lesion. No
scalp soft tissue abnormality.

Sinuses/Orbits: Globes within normal limits. Increased CSF signal
intensity seen along the optic nerve sheaths bilaterally, suggesting
elevated intracranial pressures. Orbital soft tissues otherwise
unremarkable. Moderate mucosal thickening within the right maxillary
sinus. Paranasal sinuses are otherwise largely clear. Small right
with trace left mastoid effusions.

Other: None.

MRV HEAD FINDINGS:

The above described dural based mass again noted at the posterior
aspect of the superior sagittal sinus. The mass invades and
obliterates the superior sagittal sinus at this level (series 1,
image 98). Tubular filling defect is seen within the superior
sagittal sinus anterior to the mass, likely a small amount of
nonocclusive thrombus (series 13, images 88, 102). Superior sagittal
sinus otherwise patent anteriorly. Additional extension of the
lesion to involve the posterior aspect of the suture superior
sagittal sinus as it courses towards the torcula (series 12, image
82) the torcula itself is patent. Transverse and sigmoid sinuses are
patent as are the visualized proximal internal jugular veins.
Straight sinus, vein of ATO, inferior sagittal sinus, internal
cerebral veins, and basal veins of ATO are patent. No
abnormality about the cavernous sinus. Superior orbital veins
symmetric and within normal limits. No appreciable cortical venous
thrombosis.
IMPRESSION: MRI HEAD IMPRESSION:

1. Widespread intracranial metastatic disease involving both
cerebral hemispheres and cerebellum as above. Mild localized edema
with hemorrhagic blood products about several of these lesions
without significant regional mass effect or midline shift.
2. 3.2 x 6.6 x 4.8 cm enhancing extra-axial mass at the posterior
aspect of the superior sagittal sinus, indeterminate. While this
finding could reflect an additional dural-based metastasis, a
possible concomitant meningioma could also be considered.

MRV HEAD IMPRESSION:

1. Invasion and obliteration of the posterior superior sagittal
sinus by the above described dural based mass. Small amount of
additional nonocclusive thrombus within the superior sagittal sinus
anterior to the mass as above.
2. Remainder of the major dural sinuses are otherwise patent.

## 2021-11-27 MED ORDER — INSULIN ASPART 100 UNIT/ML IJ SOLN
0.0000 [IU] | Freq: Three times a day (TID) | INTRAMUSCULAR | Status: DC
  Administered 2021-11-27 (×3): 3 [IU] via SUBCUTANEOUS
  Administered 2021-11-28: 2 [IU] via SUBCUTANEOUS
  Administered 2021-11-28: 3 [IU] via SUBCUTANEOUS
  Administered 2021-11-29 (×3): 5 [IU] via SUBCUTANEOUS
  Administered 2021-11-30: 3 [IU] via SUBCUTANEOUS
  Administered 2021-11-30: 5 [IU] via SUBCUTANEOUS
  Administered 2021-11-30: 2 [IU] via SUBCUTANEOUS
  Administered 2021-12-01: 3 [IU] via SUBCUTANEOUS
  Administered 2021-12-01: 2 [IU] via SUBCUTANEOUS
  Administered 2021-12-01: 5 [IU] via SUBCUTANEOUS
  Administered 2021-12-02 (×3): 3 [IU] via SUBCUTANEOUS
  Administered 2021-12-03: 12:00:00 5 [IU] via SUBCUTANEOUS
  Administered 2021-12-03: 08:00:00 3 [IU] via SUBCUTANEOUS
  Administered 2021-12-03 – 2021-12-04 (×2): 5 [IU] via SUBCUTANEOUS
  Administered 2021-12-04: 3 [IU] via SUBCUTANEOUS
  Administered 2021-12-04: 8 [IU] via SUBCUTANEOUS
  Administered 2021-12-05 (×2): 3 [IU] via SUBCUTANEOUS

## 2021-11-27 MED ORDER — DOCUSATE SODIUM 100 MG PO CAPS
100.0000 mg | ORAL_CAPSULE | Freq: Two times a day (BID) | ORAL | Status: DC | PRN
  Administered 2021-11-30: 100 mg via ORAL
  Filled 2021-11-27: qty 1

## 2021-11-27 MED ORDER — HYDRALAZINE HCL 20 MG/ML IJ SOLN
10.0000 mg | Freq: Three times a day (TID) | INTRAMUSCULAR | Status: DC | PRN
  Administered 2021-11-29: 10 mg via INTRAVENOUS
  Filled 2021-11-27: qty 1

## 2021-11-27 MED ORDER — ONDANSETRON HCL 4 MG/2ML IJ SOLN: 4.0000 mg | Freq: Four times a day (QID) | INTRAMUSCULAR | Status: DC | PRN

## 2021-11-27 MED ORDER — ACETAMINOPHEN 325 MG PO TABS
650.0000 mg | ORAL_TABLET | Freq: Four times a day (QID) | ORAL | Status: DC | PRN
  Administered 2021-11-28 – 2021-12-05 (×20): 650 mg via ORAL
  Filled 2021-11-27 (×20): qty 2

## 2021-11-27 MED ORDER — CALCIUM CARBONATE ANTACID 500 MG PO CHEW
1.0000 | CHEWABLE_TABLET | Freq: Four times a day (QID) | ORAL | Status: DC | PRN
  Administered 2021-11-29 – 2021-12-03 (×2): 200 mg via ORAL
  Filled 2021-11-27 (×2): qty 1

## 2021-11-27 MED ORDER — GADOBUTROL 1 MMOL/ML IV SOLN
10.0000 mL | Freq: Once | INTRAVENOUS | Status: AC | PRN
  Administered 2021-11-27: 10 mL via INTRAVENOUS

## 2021-11-27 MED ORDER — LORAZEPAM 2 MG/ML IJ SOLN
1.0000 mg | Freq: Once | INTRAMUSCULAR | Status: AC
  Administered 2021-11-27: 1 mg via INTRAVENOUS

## 2021-11-27 MED ORDER — ZOLPIDEM TARTRATE 5 MG PO TABS
5.0000 mg | ORAL_TABLET | Freq: Every evening | ORAL | Status: DC | PRN
  Administered 2021-11-28 – 2021-12-04 (×8): 5 mg via ORAL
  Filled 2021-11-27 (×8): qty 1

## 2021-11-27 MED ORDER — DEXAMETHASONE SODIUM PHOSPHATE 4 MG/ML IJ SOLN
4.0000 mg | Freq: Three times a day (TID) | INTRAMUSCULAR | Status: DC
  Administered 2021-11-27 – 2021-12-04 (×19): 4 mg via INTRAVENOUS
  Filled 2021-11-27 (×20): qty 1

## 2021-11-27 MED ORDER — ALUM & MAG HYDROXIDE-SIMETH 200-200-20 MG/5ML PO SUSP
30.0000 mL | Freq: Four times a day (QID) | ORAL | Status: DC | PRN
  Administered 2021-11-27: 30 mL via ORAL
  Filled 2021-11-27 (×2): qty 30

## 2021-11-27 MED ORDER — ONDANSETRON HCL 4 MG PO TABS: 4.0000 mg | ORAL_TABLET | Freq: Four times a day (QID) | ORAL | Status: DC | PRN

## 2021-11-27 MED ORDER — ACETAMINOPHEN 650 MG RE SUPP: 650.0000 mg | Freq: Four times a day (QID) | RECTAL | Status: DC | PRN

## 2021-11-27 MED ORDER — LORAZEPAM 2 MG/ML IJ SOLN
1.0000 mg | Freq: Once | INTRAMUSCULAR | Status: AC
  Administered 2021-11-27: 1 mg via INTRAVENOUS
  Filled 2021-11-27: qty 1

## 2021-11-27 MED ORDER — PANTOPRAZOLE SODIUM 40 MG PO TBEC
40.0000 mg | DELAYED_RELEASE_TABLET | Freq: Every day | ORAL | Status: DC
  Administered 2021-11-27 – 2021-12-05 (×9): 40 mg via ORAL
  Filled 2021-11-27 (×9): qty 1

## 2021-11-27 NOTE — Plan of Care (Signed)

## 2021-11-27 NOTE — Care Plan (Signed)
This 65 years old male with PMH significant for prostate cancer, type 2 diabetes presented in the ED with 3 weeks history of progressive weakness, decreased appetite, 20 pound weight loss.  Patient also been experiencing episodes of severe dizziness and disequilibrium.  He reports he was diagnosed with COVID 3 weeks ago.  Work-up in the ED shows sodium 118.  CT chest showed 2.9 cm lobulated noncalcified pleural-based nodule in the right upper lobe suggesting possible primary malignant neoplasm.  There is multiple lesions noted in the brain, adrenal gland and nearby lymph nodes suggesting metastasis. 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  Sodium is slowly improving with fluid restriction.  Oncology is consulted.  Continue dexamethasone, MRI brain is pending.  Patient need interventional radiology to perform biopsy possibly peritoneal nodule.  Patient also need Port-A-Cath placement for chemotherapy.  Patient was seen and examined at bedside.  Palliative care notified.

## 2021-11-27 NOTE — Consult Note (Addendum)
Ryan [name]  [phone] [phone]   Have seen the patient, examined him and agree with documentation as follows [facility]  Referral MD:Dr. Shawna Clamp  Reason for Referral: Brain mass, lung mass, adrenal lesions,?  Low-density liver lesions, possible concerning lesion in the pancreas  HPI: Ryan [name] is a 65 year old male with a past medical history significant for prostate cancer and diabetes.  It appears that he was diagnosed with prostate cancer in May 2017 for a pT2a 4+4, N0, margins negative prostate cancer.  He underwent prostatectomy.  Last PSA was performed on 10/01/2021 at [facility] and was 0.01.  He presented to the emergency department with weakness.  He has had a 3-week history of progressive weakness, anorexia, 20 pound weight loss.  He was also experiencing episodes of severe dizziness and disequilibrium.  He also reported 1 episode of vomiting daily x2 weeks. Of note, he was diagnosed with COVID-19 just over 3 weeks prior to admission.  On admission, sodium was low at 118.  CT head without contrast showed a 5.9 x 3.5 x 4.4 cm midline extra-axial hyperdense mass lesion surrounds the superior sagittal sinus and most likely represents metastasis.  CT chest/abdomen/pelvis with contrast showed a 2.9 cm lobulated noncalcified pleural-based nodule in the right upper lobe suggesting possible primary malignant neoplasm, bulky, pathologically enlarged lymph nodes in the mediastinum and both hilar regions, extrinsic pressure over the right main pulmonary artery and superior vena cava by the enlarged lymph nodes in the mediastinum, nodular densities in both adrenal glands largest measuring 3 cm in the left adrenal gland and suspicious for metastatic disease, enlarged lymph nodes in the retroperitoneum mesentery, multiple cyst in the liver with other indeterminate low density lesions in the liver, head of the pancreas is enlarged  in size with lobulated margins and the possibility of neoplastic process in the head of the pancreas is not excluded.  I had with the patient and his significant other, Ryan [name], who was sitting at the bedside.  The patient reports that he has been feeling weak and had recent COVID infection.  He has had a persistent cough.  He has also had a poor appetite and weight loss recently.  Due to his symptoms, a chest x-ray was performed on 11/24/2021 which showed airspace disease in the right upper lobe with fullness in the right hilum and paratracheal region which could reflect pneumonia but cannot exclude adenopathy or central obstructing mass/process.  He was post to have a CT scan as an outpatient but I do not see that this was done.  Due to being off balance and confusion, he came to the emergency department. Ryan [name] reports that he was having some involuntary movements particularly in his arms but less so in his legs.  Since coming to the emergency department, she notices an improvement in this.  She also notices that his confusion has improved.  He was able to walk to the bathroom with minimal assistance.  He has had a fall at home.  He states that he has been having headaches for about 6 months but none at this time.  He has been having dizziness but no vision changes.  He is not having any chest pain but has had a cough and shortness of breath.  He denies dyspnea with exertion.  He denies facial swelling.  Denies abdominal pain, nausea, vomiting.  The patient has been with his significant other for about 13 years.  He has 1 son who  lives in [place].  He has been smoking a half a pack of cigarettes per day since about the age of 78.  No alcohol use.  Medical oncology was asked see the patient make recommendations regarding his abnormal CT scan findings  Past Medical History:  Diagnosis Date   Ascites    Diabetes mellitus without complication ([place])    Hernia of abdominal cavity    Ileus ([place])    Prostate  cancer ([place])   :   Past Surgical History:  Procedure Laterality Date   PROSTATE SURGERY    :   Current Facility-Administered Medications  Medication Dose Route Frequency Provider Last Rate Last Admin   acetaminophen (TYLENOL) tablet 650 mg  650 mg Oral Q6H PRN Etta Quill, DO       Or   acetaminophen (TYLENOL) suppository 650 mg  650 mg Rectal Q6H PRN Etta Quill, DO       chlorhexidine (PERIDEX) 0.12 % solution 15 mL  15 mL Mouth Rinse BID Karmen Bongo, MD   15 mL at 11/27/21 5465   Chlorhexidine Gluconate Cloth 2 % PADS 6 each  6 each Topical Daily Karmen Bongo, MD   6 each at 11/27/21 0941   dexamethasone (DECADRON) injection 4 mg  4 mg Intravenous Q6H Sponseller, Rebekah R, PA-C   4 mg at 11/27/21 0524   docusate sodium (COLACE) capsule 100 mg  100 mg Oral BID PRN Shawna Clamp, MD       hydrALAZINE (APRESOLINE) injection 10 mg  10 mg Intravenous Q8H PRN Shawna Clamp, MD       insulin aspart (novoLOG) injection 0-15 Units  0-15 Units Subcutaneous TID WC Etta Quill, DO   3 Units at 11/27/21 0900   LORazepam (ATIVAN) injection 1 mg  1 mg Intravenous Once Sponseller, Rebekah R, PA-C       MEDLINE mouth rinse  15 mL Mouth Rinse q12n4p Karmen Bongo, MD       nicotine (NICODERM CQ - dosed in mg/24 hours) patch 21 mg  21 mg Transdermal Once Sponseller, Rebekah R, PA-C   21 mg at 11/26/21 1451   ondansetron (ZOFRAN) tablet 4 mg  4 mg Oral Q6H PRN Etta Quill, DO       Or   ondansetron (ZOFRAN) injection 4 mg  4 mg Intravenous Q6H PRN Etta Quill, DO         No Known Allergies:   Family History  Problem Relation Age of Onset   Cancer Neg Hx   :   Social History   Socioeconomic History   Marital status: Divorced    Spouse name: Not on file   Number of children: Not on file   Years of education: Not on file   Highest education level: Not on file  Occupational History   Not on file  Tobacco Use   Smoking status: Every Day    Packs/day:  0.50    Types: Cigarettes   Smokeless tobacco: Never  Substance and Sexual Activity   Alcohol use: Yes    Comment: occ   Drug use: No   Sexual activity: Not on file  Other Topics Concern   Not on file  Social History Narrative   Not on file   Social Determinants of Health   Financial Resource Strain: Not on file  Food Insecurity: Not on file  Transportation Needs: Not on file  Physical Activity: Not on file  Stress: Not on file  Social Connections: Not on  file  Intimate Partner Violence: Not on file  :  Review of Systems: A comprehensive 14 point review of systems was negative except as noted in the HPI.  Exam: Patient Vitals for the past 24 hrs:  BP Temp Temp src Pulse Resp SpO2  11/27/21 0800 (!) 170/95 -- -- 94 18 97 %  11/27/21 0420 (!) 172/88 -- -- 98 14 98 %  11/27/21 0400 (!) 182/85 (!) 97.4 F (36.3 C) Oral 94 (!) 23 99 %  11/27/21 0300 (!) 152/74 -- -- 88 (!) 0 94 %  11/27/21 0230 (!) 167/74 -- -- 91 (!) 0 95 %  11/27/21 0200 (!) 173/76 -- -- 92 20 94 %  11/27/21 0110 (!) 178/85 -- -- 93 11 93 %  11/27/21 0010 (!) 168/86 -- -- 91 -- 94 %  11/26/21 2304 -- 98.3 F (36.8 C) Oral -- -- --  11/26/21 2243 135/77 -- -- 96 16 96 %  11/26/21 2230 (!) 157/79 -- -- -- 11 --  11/26/21 2145 (!) 153/83 -- -- -- 20 --  11/26/21 2100 (!) 141/118 -- -- 92 18 97 %  11/26/21 2015 (!) 149/78 -- -- 90 15 97 %  11/26/21 1845 (!) 146/98 -- -- 85 (!) 23 95 %  11/26/21 1800 (!) 156/75 -- -- 90 14 98 %  11/26/21 1715 118/71 -- -- 83 12 93 %  11/26/21 1630 (!) 155/82 -- -- 90 17 94 %  11/26/21 1545 (!) 138/122 -- -- 81 15 97 %  11/26/21 1539 (!) 155/82 -- -- 85 15 96 %  11/26/21 1515 -- -- -- 89 19 98 %  11/26/21 1500 -- -- -- 89 19 99 %  11/26/21 1415 (!) 162/85 -- -- 88 15 96 %  11/26/21 1345 (!) 160/78 -- -- 92 19 97 %  11/26/21 1318 (!) 147/84 -- -- 88 19 98 %  11/26/21 1207 (!) 152/112 -- -- 89 (!) 22 98 %    General: Awake and alert, no distress Eyes:  no scleral  icterus.   ENT:  There were no oropharyngeal lesions.   Lymphatics:  Negative cervical, supraclavicular or axillary adenopathy.   Respiratory: lungs were clear bilaterally without wheezing or crackles.   Cardiovascular:  Regular rate and rhythm, S1/S2, without murmur, rub or gallop.  There was no pedal edema.   GI: Positive bowel sounds, soft, nontender, palpable peritoneal nodule with deep palpation near the umbilicus  Skin exam was without echymosis, petichae.   Neuro exam was nonfocal. Patient was alert and oriented.  Anxious at times and repeatedly asks to go home.   Lab Results  Component Value Date   WBC 8.7 11/26/2021   HGB 12.0 (L) 11/26/2021   HCT 34.3 (L) 11/26/2021   PLT 247 11/26/2021   GLUCOSE 180 (H) 11/27/2021   ALT 15 11/26/2021   AST 27 11/26/2021   NA 121 (L) 11/27/2021   K 4.3 11/27/2021   CL 89 (L) 11/27/2021   CREATININE 0.45 (L) 11/27/2021   BUN 12 11/27/2021   CO2 23 11/27/2021   I personally reviewed his CT imaging CT Head Wo Contrast  Result Date: 11/26/2021 CLINICAL DATA:  Dizziness and disequilibrium. Symptoms for 1 month. COVID 3 weeks ago. Prostate cancer with new lung mass. EXAM: CT HEAD WITHOUT CONTRAST TECHNIQUE: Contiguous axial images were obtained from the base of the skull through the vertex without intravenous contrast. COMPARISON:  None. FINDINGS: Brain: A midline extra-axial hyperdense mass lesion surrounds the superior sagittal sinus.  The lesion measures 5.9 x 3.5 x 4.4 cm. An area of hypoattenuation is present in the posterior right frontal lobe adjacent to the lesion. There is mass effect on the posterior frontal and parietal lobes bilaterally. Separate hyperdense focus is present in the cortical or subcortical left parietal lobe on axial image 24 of series 5. Similar subcortical hyperdensity is present in the left occipital lobe on axial image 17 of series 5. Areas of hypoattenuation are present in the cerebellum. The largest is in midline with  some surrounding hyperdensity, also concerning for a metastatic lesion. It measures 2.2 x 2.1 cm on saddle sagittal image 29 of series 4. Smaller hypodense left cystic lesions the cerebellum are also concerning metastases. Infarct is considered less likely. A hypodense lesion in the right temporal lobe measures 11 mm. The ventricles are of normal size.  No significant fluid is present Vascular: No hyperdense vessel or unexpected calcification. Skull: Calvarium is intact. No focal lytic or blastic lesions are present. No significant extracranial soft tissue lesion is present. Sinuses/Orbits: Right maxillary sinus opacification is likely chronic. There is some wall thickening. Minimal air is present in the sinus. The paranasal sinuses and mastoid air cells are otherwise clear. The globes and orbits are within normal limits. IMPRESSION: 1. 5.9 x 3.5 x 4.4 cm midline extra-axial hyperdense mass lesion surrounds the superior sagittal sinus. This most likely represents a metastasis. Meningioma is considered less likely. 2. Other hyperdense lesions are present in the cortical or subcortical left parietal lobe, left occipital lobe, and right temporal lobe concerning for metastatic disease. 3. There is mass effect on the posterior frontal and parietal lobes bilaterally. 4. Recommend MRI the brain without and contrast for further evaluation of these lesions. 5. Chronic right maxillary sinus disease. These results were called by telephone at the time of interpretation on 11/26/2021 at 1:30 pm to provider Dr. Melina Copa, who verbally acknowledged these results. Electronically Signed   By: San Morelle M.D.   On: 11/26/2021 13:46   CT CHEST ABDOMEN PELVIS W CONTRAST  Result Date: 11/26/2021 CLINICAL DATA:  Persistent cough, shortness of breath EXAM: CT CHEST, ABDOMEN, AND PELVIS WITH CONTRAST TECHNIQUE: Multidetector CT imaging of the chest, abdomen and pelvis was performed following the standard protocol during bolus  administration of intravenous contrast. CONTRAST:  198mL OMNIPAQUE IOHEXOL 300 MG/ML  SOLN COMPARISON:  Previous studies including chest radiographs done on 11/24/2021 FINDINGS: CT CHEST FINDINGS Cardiovascular: Coronary artery calcifications are seen. There is homogeneous enhancement in thoracic aorta. There are no intraluminal filling defects in central pulmonary artery branches. There is extrinsic compression of right main pulmonary artery caused by pathologically enlarged lymph nodes in mediastinum and right hilum. There is extrinsic pressure over the superior vena cava caused by lymphadenopathy. Mediastinum/Nodes: There is bulky lymphadenopathy in the mediastinum extending to the right hilum measuring approximately 8.6 x 8.6 x 9.5 cm. There is 2.6 x 1.9 cm node in the left hilum. Lungs/Pleura: Centrilobular emphysema is seen. In image 54 of series 4, there is 2.9 x 2.4 cm pleural-based noncalcified nodule in the right upper lobe. There are increased interstitial markings in the right upper lobe. In the image 71, there is 12 mm nodular density in the right upper lobe. In the image 73, there is 6 mm nodule in the right upper lobe. There are 3 small nodular densities each measuring less than 9 mm in size in the right lower lobe. Musculoskeletal: Unremarkable. CT ABDOMEN PELVIS FINDINGS Hepatobiliary: Liver measures 17.5 cm in length.  In the image 58 of series 2, there is 1.6 cm fluid density lesion in the left lobe, possibly cyst. There are other scattered low-density foci in the right lobe some of which may be cysts. Possibility of space-occupying neoplastic process is not excluded. There are slightly enlarged lymph nodes anterior to the liver and adjacent to the pericardium largest measuring 2.7 x 1.7 cm. There is no dilation of bile ducts. Gallbladder is not distended. Pancreas: Head of the pancreas is prominent in size. There is no dilation of pancreatic duct. No focal abnormality is seen in the body and tail  of pancreas. Spleen: Spleen is not enlarged. Adrenals/Urinary Tract: There is 3 cm nodule in the left adrenal. There is 2 cm nodule in the right adrenal. There is no hydronephrosis. There are no renal or ureteral stones. There are smooth marginated nodular densities adjacent to the kidneys largest measuring 3.6 cm posterior to the upper pole of right kidney. Density measurements in this lesions are higher than usual for simple cysts. Ureters not dilated. Urinary bladder is not distended. Stomach/Bowel: Stomach is unremarkable. Small bowel loops are not dilated. Appendix is not dilated. There is no significant wall thickening in colon. Vascular/Lymphatic: There are scattered atherosclerotic plaques and calcifications in the aorta and its major branches. There are pathologically enlarged lymph nodes in the mesentery and retroperitoneum. Largest of these nodes is noted anterior to the transverse colon measuring 5.9 x 3 cm. There are multiple other smaller nodules of varying sizes in the abdomen in the mesentery and retroperitoneum. Reproductive: Prostate is not enlarged. There is fluid density in the the region of prostatic urethra. This may suggest previous prostate surgery. Other: In image 76 of series 2, there is a 12 mm subcutaneous nodule in the right anterior abdominal wall. Small bilateral inguinal hernias containing fat are seen. Musculoskeletal: Unremarkable. IMPRESSION: There is 2.9 cm lobulated noncalcified pleural-based nodule in the right upper lobe suggesting possible primary malignant neoplasm. There are pathologically enlarged bulky lymph nodes in the mediastinum and both hilar regions, more so on the right side suggesting metastatic lymphadenopathy. There is extrinsic pressure over the right main pulmonary artery and superior vena cava by the enlarged lymph nodes in mediastinum. Increased interstitial markings and small scattered nodules are seen in the right lung which may be part of pneumonia or  pulmonary metastatic disease. There are nodular densities in both adrenals largest measuring 3 cm in the left adrenal suggesting possible metastatic disease. There are enlarged lymph nodes in the retroperitoneum and mesentery largest measuring 5.9 cm in maximum diameter suggesting metastatic lymphadenopathy. There are multiple cysts in the liver. There are other indeterminate low-density lesions of varying sizes in the liver. Possibility of hepatic metastatic disease is not excluded. Head of the pancreas is enlarged in size with lobulated margins. Possibility of neoplastic process in the head of the pancreas is not excluded. Follow-up PET-CT and biopsy as warranted should be considered. Coronary artery calcifications are seen. Other findings as described in the body of the report. Electronically Signed   By: Elmer Picker M.D.   On: 11/26/2021 13:49     CT Head Wo Contrast  Result Date: 11/26/2021 CLINICAL DATA:  Dizziness and disequilibrium. Symptoms for 1 month. COVID 3 weeks ago. Prostate cancer with new lung mass. EXAM: CT HEAD WITHOUT CONTRAST TECHNIQUE: Contiguous axial images were obtained from the base of the skull through the vertex without intravenous contrast. COMPARISON:  None. FINDINGS: Brain: A midline extra-axial hyperdense mass lesion surrounds the superior  sagittal sinus. The lesion measures 5.9 x 3.5 x 4.4 cm. An area of hypoattenuation is present in the posterior right frontal lobe adjacent to the lesion. There is mass effect on the posterior frontal and parietal lobes bilaterally. Separate hyperdense focus is present in the cortical or subcortical left parietal lobe on axial image 24 of series 5. Similar subcortical hyperdensity is present in the left occipital lobe on axial image 17 of series 5. Areas of hypoattenuation are present in the cerebellum. The largest is in midline with some surrounding hyperdensity, also concerning for a metastatic lesion. It measures 2.2 x 2.1 cm on  saddle sagittal image 29 of series 4. Smaller hypodense left cystic lesions the cerebellum are also concerning metastases. Infarct is considered less likely. A hypodense lesion in the right temporal lobe measures 11 mm. The ventricles are of normal size.  No significant fluid is present Vascular: No hyperdense vessel or unexpected calcification. Skull: Calvarium is intact. No focal lytic or blastic lesions are present. No significant extracranial soft tissue lesion is present. Sinuses/Orbits: Right maxillary sinus opacification is likely chronic. There is some wall thickening. Minimal air is present in the sinus. The paranasal sinuses and mastoid air cells are otherwise clear. The globes and orbits are within normal limits. IMPRESSION: 1. 5.9 x 3.5 x 4.4 cm midline extra-axial hyperdense mass lesion surrounds the superior sagittal sinus. This most likely represents a metastasis. Meningioma is considered less likely. 2. Other hyperdense lesions are present in the cortical or subcortical left parietal lobe, left occipital lobe, and right temporal lobe concerning for metastatic disease. 3. There is mass effect on the posterior frontal and parietal lobes bilaterally. 4. Recommend MRI the brain without and contrast for further evaluation of these lesions. 5. Chronic right maxillary sinus disease. These results were called by telephone at the time of interpretation on 11/26/2021 at 1:30 pm to provider Dr. Melina Copa, who verbally acknowledged these results. Electronically Signed   By: San Morelle M.D.   On: 11/26/2021 13:46   CT CHEST ABDOMEN PELVIS W CONTRAST  Result Date: 11/26/2021 CLINICAL DATA:  Persistent cough, shortness of breath EXAM: CT CHEST, ABDOMEN, AND PELVIS WITH CONTRAST TECHNIQUE: Multidetector CT imaging of the chest, abdomen and pelvis was performed following the standard protocol during bolus administration of intravenous contrast. CONTRAST:  182mL OMNIPAQUE IOHEXOL 300 MG/ML  SOLN COMPARISON:   Previous studies including chest radiographs done on 11/24/2021 FINDINGS: CT CHEST FINDINGS Cardiovascular: Coronary artery calcifications are seen. There is homogeneous enhancement in thoracic aorta. There are no intraluminal filling defects in central pulmonary artery branches. There is extrinsic compression of right main pulmonary artery caused by pathologically enlarged lymph nodes in mediastinum and right hilum. There is extrinsic pressure over the superior vena cava caused by lymphadenopathy. Mediastinum/Nodes: There is bulky lymphadenopathy in the mediastinum extending to the right hilum measuring approximately 8.6 x 8.6 x 9.5 cm. There is 2.6 x 1.9 cm node in the left hilum. Lungs/Pleura: Centrilobular emphysema is seen. In image 54 of series 4, there is 2.9 x 2.4 cm pleural-based noncalcified nodule in the right upper lobe. There are increased interstitial markings in the right upper lobe. In the image 71, there is 12 mm nodular density in the right upper lobe. In the image 73, there is 6 mm nodule in the right upper lobe. There are 3 small nodular densities each measuring less than 9 mm in size in the right lower lobe. Musculoskeletal: Unremarkable. CT ABDOMEN PELVIS FINDINGS Hepatobiliary: Liver measures 17.5 cm  in length. In the image 58 of series 2, there is 1.6 cm fluid density lesion in the left lobe, possibly cyst. There are other scattered low-density foci in the right lobe some of which may be cysts. Possibility of space-occupying neoplastic process is not excluded. There are slightly enlarged lymph nodes anterior to the liver and adjacent to the pericardium largest measuring 2.7 x 1.7 cm. There is no dilation of bile ducts. Gallbladder is not distended. Pancreas: Head of the pancreas is prominent in size. There is no dilation of pancreatic duct. No focal abnormality is seen in the body and tail of pancreas. Spleen: Spleen is not enlarged. Adrenals/Urinary Tract: There is 3 cm nodule in the left  adrenal. There is 2 cm nodule in the right adrenal. There is no hydronephrosis. There are no renal or ureteral stones. There are smooth marginated nodular densities adjacent to the kidneys largest measuring 3.6 cm posterior to the upper pole of right kidney. Density measurements in this lesions are higher than usual for simple cysts. Ureters not dilated. Urinary bladder is not distended. Stomach/Bowel: Stomach is unremarkable. Small bowel loops are not dilated. Appendix is not dilated. There is no significant wall thickening in colon. Vascular/Lymphatic: There are scattered atherosclerotic plaques and calcifications in the aorta and its major branches. There are pathologically enlarged lymph nodes in the mesentery and retroperitoneum. Largest of these nodes is noted anterior to the transverse colon measuring 5.9 x 3 cm. There are multiple other smaller nodules of varying sizes in the abdomen in the mesentery and retroperitoneum. Reproductive: Prostate is not enlarged. There is fluid density in the the region of prostatic urethra. This may suggest previous prostate surgery. Other: In image 76 of series 2, there is a 12 mm subcutaneous nodule in the right anterior abdominal wall. Small bilateral inguinal hernias containing fat are seen. Musculoskeletal: Unremarkable. IMPRESSION: There is 2.9 cm lobulated noncalcified pleural-based nodule in the right upper lobe suggesting possible primary malignant neoplasm. There are pathologically enlarged bulky lymph nodes in the mediastinum and both hilar regions, more so on the right side suggesting metastatic lymphadenopathy. There is extrinsic pressure over the right main pulmonary artery and superior vena cava by the enlarged lymph nodes in mediastinum. Increased interstitial markings and small scattered nodules are seen in the right lung which may be part of pneumonia or pulmonary metastatic disease. There are nodular densities in both adrenals largest measuring 3 cm in the  left adrenal suggesting possible metastatic disease. There are enlarged lymph nodes in the retroperitoneum and mesentery largest measuring 5.9 cm in maximum diameter suggesting metastatic lymphadenopathy. There are multiple cysts in the liver. There are other indeterminate low-density lesions of varying sizes in the liver. Possibility of hepatic metastatic disease is not excluded. Head of the pancreas is enlarged in size with lobulated margins. Possibility of neoplastic process in the head of the pancreas is not excluded. Follow-up PET-CT and biopsy as warranted should be considered. Coronary artery calcifications are seen. Other findings as described in the body of the report. Electronically Signed   By: Elmer Picker M.D.   On: 11/26/2021 13:49    Assessment and Plan:   Lung mass, brain mass, adrenal lesions concerning for metastatic malignancy, likely lung cancer until proven otherwise Imaging findings discussed with the patient and his significant other We discussed findings are concerning for metastatic/stage IV lung cancer We discussed need for biopsy to confirm the diagnosis and to determine the specific type of lung cancer We had initial discussions regarding  treatment which would consist of radiation to the brain and possibly the chest due to impending SVC syndrome and chemotherapy to treat systemic disease, however, we will need the biopsy result to guide treatment options Continue dexamethasone MRI brain pending Order placed for interventional radiology to perform biopsy-possibly of peritoneal nodule Order placed for interventional radiology to place Port-A-Cath for chemotherapy Message sent to radiation oncology to notify of need for consultation I have extensive discussion with the patient the rationale of keeping him in the hospital to expedite work-up He can benefit from outpatient PET/CT imaging  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 Sodium  slowly improving Monitor  History of prostate cancer Status post prostatectomy with normal PSA in October 2022 He is followed as an outpatient by his primary care provider/urology  Diabetes mellitus On sliding scale insulin Management per hospitalist  Goals of care discussion Discussed with the patient and his significant other that 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   Mikey Bussing, DNP, AGPCNP-BC, AOCNP  [name], MD

## 2021-11-27 NOTE — Consult Note (Signed)
Chief Complaint: Patient was seen in consultation today for image guided biopsy of retroperitoneal lymphadenopathy and Port-A-Cath placement Chief Complaint  Patient presents with   Weakness    (+Covid)   at the request of Azucena Freed NP  Referring Physician(s): Azucena Freed NP  Supervising Physician: Markus Daft  Patient Status: [name] - In-pt  History of Present Illness: Ryan [name] is a 65 y.o. male with PMHs of DM and prostate cancer who presented to ED on 11/26/2021 due to 3 weeks of weakness, decreased appetite, and 20 pound weight loss. Patient underwent CT CAP with contrast which showed nodule in the right upper lobe suggesting possible primary malignant neoplasm, multiple cysts in the liver, enlarged and lobulated pancreatic head, multiple lymphadenopathies including 5.9 cm retroperitoneum and mesentery lymphadenopathy.  Patient was hospitalized for further evaluation management and oncology was consulted who recommended biopsy for further evaluation and management as well as Port-A-Cath placement for possible chemotherapy.    IR was requested for biopsy and a Port-A-Cath placement. Patient was seen at the bedside.  Patient became slightly irritated and he states " I was supposed to get an MRI before the biopsy.  Nobody told me about Port-A-Cath placement."  Informed the patient that the MRI order is in, unsure to be done today.  Upon further discussion, patient is agreeable to proceed with the biopsy and Port-A-Cath placement. Informed the patient that his biopsy is scheduled for tomorrow; however, Port-A-Cath placement might not be done tomorrow at the same time.  Informed the patient that if IR cannot accommodate the Port-A-Cath placement tomorrow, it will be placed sometime next week. Patient verbalized understanding.  Denise headache, fever, chills, shortness of breath, cough, chest pain, abdominal pain, nausea ,vomiting, and bleeding.   Past Medical History:   Diagnosis Date   Ascites    Diabetes mellitus without complication ([place])    Hernia of abdominal cavity    Ileus (HCC)    Prostate cancer [facility])     Past Surgical History:  Procedure Laterality Date   PROSTATE SURGERY      Allergies: Patient has no known allergies.  Medications: Prior to Admission medications   Medication Sig Start Date End Date Taking? Authorizing Provider  atorvastatin (LIPITOR) 20 MG tablet Take 20 mg by mouth daily. 10/01/21  Yes [provider]  FEROSUL 325 (65 Fe) MG tablet Take 325 mg by mouth every other day. 10/03/21  Yes [provider]  metFORMIN (GLUCOPHAGE) 1000 MG tablet Take 500-1,000 mg by mouth 2 (two) times daily with a meal.   Yes [provider]  zolpidem (AMBIEN) 10 MG tablet Take 10 mg by mouth at bedtime as needed for sleep. 11/21/21  Yes [provider]  Elastic Bandages & Supports (ABDOMINAL BINDER/ELASTIC 2XL) MISC 1 Units by Does not apply route daily. 05/25/16   Palumbo, April, MD  lisinopril (ZESTRIL) 20 MG tablet Take 20 mg by mouth daily.    [provider]  NOVOLOG MIX 70/30 FLEXPEN (70-30) 100 UNIT/ML FlexPen Inject into the skin. Patient not taking: Reported on 11/27/2021 08/12/21   [provider]     Family History  Problem Relation Age of Onset   Cancer Neg Hx     Social History   Socioeconomic History   Marital status: Divorced    Spouse name: Not on file   Number of children: Not on file   Years of education: Not on file   Highest education level: Not on file  Occupational History  Not on file  Tobacco Use   Smoking status: Every Day    Packs/day: 0.50    Types: Cigarettes   Smokeless tobacco: Never  Substance and Sexual Activity   Alcohol use: Yes    Comment: occ   Drug use: No   Sexual activity: Not on file  Other Topics Concern   Not on file  Social History Narrative   Not on file   Social Determinants of Health   Financial Resource Strain: Not on  file  Food Insecurity: Not on file  Transportation Needs: Not on file  Physical Activity: Not on file  Stress: Not on file  Social Connections: Not on file     Review of Systems: A 12 point ROS discussed and pertinent positives are indicated in the HPI above.  All other systems are negative.  Vital Signs: BP (!) 161/94   Pulse 88   Temp 97.7 F (36.5 C) (Oral)   Resp 17   Ht 6\' 2"  (1.88 m)   Wt 235 lb (106.6 kg)   SpO2 99%   BMI 30.17 kg/m   Physical Exam Vitals and nursing note reviewed.  Constitutional:      General: Patient is not in acute distress.    Appearance: Normal appearance. Patient is not ill-appearing.  HENT:     Head: Normocephalic and atraumatic.     Mouth/Throat:     Mouth: Mucous membranes are moist.     Pharynx: Oropharynx is clear.  Cardiovascular:     Rate and Rhythm: Normal rate and regular rhythm.     Pulses: Normal pulses.     Heart sounds: Normal heart sounds.  Pulmonary:     Effort: Pulmonary effort is normal.     Breath sounds: Normal breath sounds.  Abdominal:     General: Abdomen is flat. Bowel sounds are normal.     Palpations: Abdomen is soft.  Musculoskeletal:     Cervical back: Neck supple.  Skin:    General: Skin is warm and dry.     Coloration: Skin is not jaundiced or pale.  Neurological:     Mental Status: Patient is alert and oriented to person, place, and time.  Psychiatric:        Mood and Affect: Mood normal.        Behavior: Behavior normal.        Judgment: Judgment normal.    MD Evaluation Airway: WNL Heart: WNL Abdomen: WNL Chest/ Lungs: WNL ASA  Classification: 3 Mallampati/Airway Score: Two  Imaging: CT Head Wo Contrast  Result Date: 11/26/2021 CLINICAL DATA:  Dizziness and disequilibrium. Symptoms for 1 month. COVID 3 weeks ago. Prostate cancer with new lung mass. EXAM: CT HEAD WITHOUT CONTRAST TECHNIQUE: Contiguous axial images were obtained from the base of the skull through the vertex without  intravenous contrast. COMPARISON:  None. FINDINGS: Brain: A midline extra-axial hyperdense mass lesion surrounds the superior sagittal sinus. The lesion measures 5.9 x 3.5 x 4.4 cm. An area of hypoattenuation is present in the posterior right frontal lobe adjacent to the lesion. There is mass effect on the posterior frontal and parietal lobes bilaterally. Separate hyperdense focus is present in the cortical or subcortical left parietal lobe on axial image 24 of series 5. Similar subcortical hyperdensity is present in the left occipital lobe on axial image 17 of series 5. Areas of hypoattenuation are present in the cerebellum. The largest is in midline with some surrounding hyperdensity, also concerning for a metastatic lesion. It measures 2.2  x 2.1 cm on saddle sagittal image 29 of series 4. Smaller hypodense left cystic lesions the cerebellum are also concerning metastases. Infarct is considered less likely. A hypodense lesion in the right temporal lobe measures 11 mm. The ventricles are of normal size.  No significant fluid is present Vascular: No hyperdense vessel or unexpected calcification. Skull: Calvarium is intact. No focal lytic or blastic lesions are present. No significant extracranial soft tissue lesion is present. Sinuses/Orbits: Right maxillary sinus opacification is likely chronic. There is some wall thickening. Minimal air is present in the sinus. The paranasal sinuses and mastoid air cells are otherwise clear. The globes and orbits are within normal limits. IMPRESSION: 1. 5.9 x 3.5 x 4.4 cm midline extra-axial hyperdense mass lesion surrounds the superior sagittal sinus. This most likely represents a metastasis. Meningioma is considered less likely. 2. Other hyperdense lesions are present in the cortical or subcortical left parietal lobe, left occipital lobe, and right temporal lobe concerning for metastatic disease. 3. There is mass effect on the posterior frontal and parietal lobes bilaterally. 4.  Recommend MRI the brain without and contrast for further evaluation of these lesions. 5. Chronic right maxillary sinus disease. These results were called by telephone at the time of interpretation on 11/26/2021 at 1:30 pm to provider Dr. Melina Copa, who verbally acknowledged these results. Electronically Signed   By: San Morelle M.D.   On: 11/26/2021 13:46   CT CHEST ABDOMEN PELVIS W CONTRAST  Result Date: 11/26/2021 CLINICAL DATA:  Persistent cough, shortness of breath EXAM: CT CHEST, ABDOMEN, AND PELVIS WITH CONTRAST TECHNIQUE: Multidetector CT imaging of the chest, abdomen and pelvis was performed following the standard protocol during bolus administration of intravenous contrast. CONTRAST:  14mL OMNIPAQUE IOHEXOL 300 MG/ML  SOLN COMPARISON:  Previous studies including chest radiographs done on 11/24/2021 FINDINGS: CT CHEST FINDINGS Cardiovascular: Coronary artery calcifications are seen. There is homogeneous enhancement in thoracic aorta. There are no intraluminal filling defects in central pulmonary artery branches. There is extrinsic compression of right main pulmonary artery caused by pathologically enlarged lymph nodes in mediastinum and right hilum. There is extrinsic pressure over the superior vena cava caused by lymphadenopathy. Mediastinum/Nodes: There is bulky lymphadenopathy in the mediastinum extending to the right hilum measuring approximately 8.6 x 8.6 x 9.5 cm. There is 2.6 x 1.9 cm node in the left hilum. Lungs/Pleura: Centrilobular emphysema is seen. In image 54 of series 4, there is 2.9 x 2.4 cm pleural-based noncalcified nodule in the right upper lobe. There are increased interstitial markings in the right upper lobe. In the image 71, there is 12 mm nodular density in the right upper lobe. In the image 73, there is 6 mm nodule in the right upper lobe. There are 3 small nodular densities each measuring less than 9 mm in size in the right lower lobe. Musculoskeletal: Unremarkable. CT  ABDOMEN PELVIS FINDINGS Hepatobiliary: Liver measures 17.5 cm in length. In the image 58 of series 2, there is 1.6 cm fluid density lesion in the left lobe, possibly cyst. There are other scattered low-density foci in the right lobe some of which may be cysts. Possibility of space-occupying neoplastic process is not excluded. There are slightly enlarged lymph nodes anterior to the liver and adjacent to the pericardium largest measuring 2.7 x 1.7 cm. There is no dilation of bile ducts. Gallbladder is not distended. Pancreas: Head of the pancreas is prominent in size. There is no dilation of pancreatic duct. No focal abnormality is seen in the body  and tail of pancreas. Spleen: Spleen is not enlarged. Adrenals/Urinary Tract: There is 3 cm nodule in the left adrenal. There is 2 cm nodule in the right adrenal. There is no hydronephrosis. There are no renal or ureteral stones. There are smooth marginated nodular densities adjacent to the kidneys largest measuring 3.6 cm posterior to the upper pole of right kidney. Density measurements in this lesions are higher than usual for simple cysts. Ureters not dilated. Urinary bladder is not distended. Stomach/Bowel: Stomach is unremarkable. Small bowel loops are not dilated. Appendix is not dilated. There is no significant wall thickening in colon. Vascular/Lymphatic: There are scattered atherosclerotic plaques and calcifications in the aorta and its major branches. There are pathologically enlarged lymph nodes in the mesentery and retroperitoneum. Largest of these nodes is noted anterior to the transverse colon measuring 5.9 x 3 cm. There are multiple other smaller nodules of varying sizes in the abdomen in the mesentery and retroperitoneum. Reproductive: Prostate is not enlarged. There is fluid density in the the region of prostatic urethra. This may suggest previous prostate surgery. Other: In image 76 of series 2, there is a 12 mm subcutaneous nodule in the right anterior  abdominal wall. Small bilateral inguinal hernias containing fat are seen. Musculoskeletal: Unremarkable. IMPRESSION: There is 2.9 cm lobulated noncalcified pleural-based nodule in the right upper lobe suggesting possible primary malignant neoplasm. There are pathologically enlarged bulky lymph nodes in the mediastinum and both hilar regions, more so on the right side suggesting metastatic lymphadenopathy. There is extrinsic pressure over the right main pulmonary artery and superior vena cava by the enlarged lymph nodes in mediastinum. Increased interstitial markings and small scattered nodules are seen in the right lung which may be part of pneumonia or pulmonary metastatic disease. There are nodular densities in both adrenals largest measuring 3 cm in the left adrenal suggesting possible metastatic disease. There are enlarged lymph nodes in the retroperitoneum and mesentery largest measuring 5.9 cm in maximum diameter suggesting metastatic lymphadenopathy. There are multiple cysts in the liver. There are other indeterminate low-density lesions of varying sizes in the liver. Possibility of hepatic metastatic disease is not excluded. Head of the pancreas is enlarged in size with lobulated margins. Possibility of neoplastic process in the head of the pancreas is not excluded. Follow-up PET-CT and biopsy as warranted should be considered. Coronary artery calcifications are seen. Other findings as described in the body of the report. Electronically Signed   By: Elmer Picker M.D.   On: 11/26/2021 13:49    Labs:  CBC: Recent Labs    11/26/21 1130  WBC 8.7  HGB 12.0*  HCT 34.3*  PLT 247    COAGS: No results for input(s): INR, APTT in the last 8760 hours.  BMP: Recent Labs    11/26/21 1130 11/26/21 1329 11/27/21 0117 11/27/21 0657 11/27/21 1302  NA 118* 117* 120* 121* 123*  K 4.2  --  4.1 4.3 4.0  CL 86*  --  90* 89* 89*  CO2 23  --  20* 23 24  GLUCOSE 119*  --  149* 180* 187*  BUN 12   --  11 12 14   CALCIUM 8.8*  --  9.0 8.9 9.3  CREATININE 0.43*  --  0.37* 0.45* 0.56*  GFRNONAA >60  --  >60 >60 >60    LIVER FUNCTION TESTS: Recent Labs    11/26/21 1130  BILITOT 0.6  AST 27  ALT 15  ALKPHOS 69  PROT 7.1  ALBUMIN 3.7  TUMOR MARKERS: No results for input(s): AFPTM, CEA, CA199, CHROMGRNA in the last 8760 hours.  Assessment and Plan: 65 y.o. male with history of prostate cancer in 2017, recent imaging finding concerning for metastatic malignancy.  He is in need of biopsy for further evaluation and management as well as Port-A-Cath placement for possible chemotherapy.  I was requested for image guided biopsy and a Port-A-Cath placement. Case was reviewed and approved by Dr. Anselm Pancoast.  The biopsy is scheduled for tomorrow pending IR schedule. N.p.o. at midnight VSS CBC with mild anemia PLT 247 No updated INR, will be obtained tomorrow morning Patient currently not AC/AP  Risks and benefits of retroperitoneal lymph node biopsy was discussed with the patient and/or patient's family including, but not limited to bleeding, infection, damage to adjacent structures or low yield requiring additional tests.  Risks and benefits of image guided port-a-catheter placement was discussed with the patient including, but not limited to bleeding, infection, pneumothorax, or fibrin sheath development and need for additional procedures.  All of the patient's questions were answered, patient is agreeable to proceed. Consent signed and in chart.  Thank you for this interesting consult.  I greatly enjoyed meeting Kaien Pezzullo and look forward to participating in their care.  A copy of this report was sent to the requesting provider on this date.  Electronically Signed: Tera Mater, PA-C 11/27/2021, 3:55 PM   I spent a total of 40 Minutes    in face to face in clinical consultation, greater than 50% of which was counseling/coordinating care for RP LAN bx and PAC placement.   This  chart was dictated using voice recognition software.  Despite best efforts to proofread,  errors can occur which can change the documentation meaning.

## 2021-11-27 NOTE — H&P (Addendum)
History and Physical    Ryan [name] HYQ:[id number] DOB: Apr 04, 1956 DOA: 11/26/2021  PCP: [name], DO  Patient coming from: Home  I have personally briefly reviewed patient's old medical records in [facility]  Chief Complaint: Weakness  HPI: Ryan [name] is a 65 y.o. male with medical history significant of prostate CA, DM2.  Prostate CA was treated with total prostatectomy and radiation.  Pt notes no recent increase in PSA which has been monitored frequently.  Pt with 3 weeks of progressively worsening weakness, loss of appetite, 20 lb wt loss.  Pt diagnosed with COVID-19 just over 3 weeks ago.  Prior to that diagnosis and ever since, he has had chronic cough.  Pt states after COVID he lost sense of taste and has had no appetite since then.  He presents today due to concerns for multiple episodes of severe dizziness and disequilibrium.  He states that this morning he was only able to walk when leaning up against the wall; states that he did not have any sensation that the room was spinning or that he was swaying, but was unable to stand upright.  States that he feels like both of his legs are weak but denies any numbness, tingling in his legs or any saddle anesthesia.  Additionally has been having at least one episode of NBNB emesis daily x 2 weeks.  Also new headaches.  No melena, no hematochezia.   ED Course: work up in ED demonstrates hyponatremia with sodium 118.  Pt also has apparently new onset metastatic CA.  Looks like lung primary with mets to brain and B adrenal glands.   Review of Systems: As per HPI, otherwise all review of systems negative.  Past Medical History:  Diagnosis Date   Ascites    Diabetes mellitus without complication ([place])    Hernia of abdominal cavity    Ileus (HCC)    Prostate cancer [facility])     Past Surgical History:  Procedure Laterality Date   PROSTATE SURGERY       reports that he has been smoking cigarettes. He has  been smoking an average of .5 packs per day. He has never used smokeless tobacco. He reports current alcohol use. He reports that he does not use drugs.  No Known Allergies  Family History  Problem Relation Age of Onset   Cancer Neg Hx      Prior to Admission medications   Medication Sig Start Date End Date Taking? Authorizing Provider  Elastic Bandages & Supports (ABDOMINAL BINDER/ELASTIC 2XL) MISC 1 Units by Does not apply route daily. 05/25/16   Palumbo, April, MD  insulin aspart (NOVOLOG) 100 UNIT/ML injection Inject into the skin 3 (three) times daily before meals.    [provider]  lisinopril (PRINIVIL,ZESTRIL) 40 MG tablet Take 40 mg by mouth daily.    [provider]  metFORMIN (GLUCOPHAGE) 1000 MG tablet Take 1,000 mg by mouth 2 (two) times daily with a meal.    [provider]  ondansetron (ZOFRAN ODT) 8 MG disintegrating tablet 8mg  ODT q8 hours prn nausea 05/25/16   Palumbo, April, MD  traMADol (ULTRAM) 50 MG tablet Take 1 tablet (50 mg total) by mouth every 6 (six) hours as needed. 01/01/19   Fredia Sorrow, MD    Physical Exam: Vitals:   11/26/21 2100 11/26/21 2145 11/26/21 2230 11/26/21 2243  BP: (!) 141/118 (!) 153/83 (!) 157/79 135/77  Pulse: 92   96  Resp: 18 20 11 16   Temp:  TempSrc:      SpO2: 97%   96%  Weight:      Height:        Constitutional: NAD, calm, comfortable Eyes: PERRL, lids and conjunctivae normal ENMT: Mucous membranes are moist. Posterior pharynx clear of any exudate or lesions.Normal dentition.  Neck: normal, supple, no masses, no thyromegaly Respiratory: clear to auscultation bilaterally, no wheezing, no crackles. Normal respiratory effort. No accessory muscle use.  Cardiovascular: Regular rate and rhythm, no murmurs / rubs / gallops. No extremity edema. 2+ pedal pulses. No carotid bruits.  Abdomen: no tenderness, no masses palpated. No hepatosplenomegaly. Bowel sounds positive.  Musculoskeletal: no clubbing /  cyanosis. No joint deformity upper and lower extremities. Good ROM, no contractures. Normal muscle tone.  Skin: no rashes, lesions, ulcers. No induration Neurologic: CN 2-12 grossly intact. Sensation intact, DTR normal. Strength 5/5 in all 4.  Psychiatric: Mild confusion   Labs on Admission: I have personally reviewed following labs and imaging studies  CBC: Recent Labs  Lab 11/26/21 1130  WBC 8.7  NEUTROABS 7.2  HGB 12.0*  HCT 34.3*  MCV 90.5  PLT 423   Basic Metabolic Panel: Recent Labs  Lab 11/26/21 1130 11/26/21 1329  NA 118* 117*  K 4.2  --   CL 86*  --   CO2 23  --   GLUCOSE 119*  --   BUN 12  --   CREATININE 0.43*  --   CALCIUM 8.8*  --   MG 1.6*  --    GFR: Estimated Creatinine Clearance: 116.6 mL/min (A) (by C-G formula based on SCr of 0.43 mg/dL (L)). Liver Function Tests: Recent Labs  Lab 11/26/21 1130  AST 27  ALT 15  ALKPHOS 69  BILITOT 0.6  PROT 7.1  ALBUMIN 3.7   Recent Labs  Lab 11/26/21 1130  LIPASE 114*   No results for input(s): AMMONIA in the last 168 hours. Coagulation Profile: No results for input(s): INR, PROTIME in the last 168 hours. Cardiac Enzymes: No results for input(s): CKTOTAL, CKMB, CKMBINDEX, TROPONINI in the last 168 hours. BNP (last 3 results) No results for input(s): PROBNP in the last 8760 hours. HbA1C: No results for input(s): HGBA1C in the last 72 hours. CBG: Recent Labs  Lab 11/26/21 1128  GLUCAP 129*   Lipid Profile: No results for input(s): CHOL, HDL, LDLCALC, TRIG, CHOLHDL, LDLDIRECT in the last 72 hours. Thyroid Function Tests: No results for input(s): TSH, T4TOTAL, FREET4, T3FREE, THYROIDAB in the last 72 hours. Anemia Panel: No results for input(s): VITAMINB12, FOLATE, FERRITIN, TIBC, IRON, RETICCTPCT in the last 72 hours. Urine analysis:    Component Value Date/Time   COLORURINE YELLOW 11/26/2021 1030   APPEARANCEUR CLEAR 11/26/2021 1030   LABSPEC 1.020 11/26/2021 1030   PHURINE 6.0  11/26/2021 1030   GLUCOSEU NEGATIVE 11/26/2021 1030   HGBUR NEGATIVE 11/26/2021 1030   BILIRUBINUR NEGATIVE 11/26/2021 1030   KETONESUR NEGATIVE 11/26/2021 1030   PROTEINUR NEGATIVE 11/26/2021 1030   NITRITE NEGATIVE 11/26/2021 1030   LEUKOCYTESUR NEGATIVE 11/26/2021 1030    Radiological Exams on Admission: CT Head Wo Contrast  Result Date: 11/26/2021 CLINICAL DATA:  Dizziness and disequilibrium. Symptoms for 1 month. COVID 3 weeks ago. Prostate cancer with new lung mass. EXAM: CT HEAD WITHOUT CONTRAST TECHNIQUE: Contiguous axial images were obtained from the base of the skull through the vertex without intravenous contrast. COMPARISON:  None. FINDINGS: Brain: A midline extra-axial hyperdense mass lesion surrounds the superior sagittal sinus. The lesion measures 5.9 x 3.5 x  4.4 cm. An area of hypoattenuation is present in the posterior right frontal lobe adjacent to the lesion. There is mass effect on the posterior frontal and parietal lobes bilaterally. Separate hyperdense focus is present in the cortical or subcortical left parietal lobe on axial image 24 of series 5. Similar subcortical hyperdensity is present in the left occipital lobe on axial image 17 of series 5. Areas of hypoattenuation are present in the cerebellum. The largest is in midline with some surrounding hyperdensity, also concerning for a metastatic lesion. It measures 2.2 x 2.1 cm on saddle sagittal image 29 of series 4. Smaller hypodense left cystic lesions the cerebellum are also concerning metastases. Infarct is considered less likely. A hypodense lesion in the right temporal lobe measures 11 mm. The ventricles are of normal size.  No significant fluid is present Vascular: No hyperdense vessel or unexpected calcification. Skull: Calvarium is intact. No focal lytic or blastic lesions are present. No significant extracranial soft tissue lesion is present. Sinuses/Orbits: Right maxillary sinus opacification is likely chronic. There  is some wall thickening. Minimal air is present in the sinus. The paranasal sinuses and mastoid air cells are otherwise clear. The globes and orbits are within normal limits. IMPRESSION: 1. 5.9 x 3.5 x 4.4 cm midline extra-axial hyperdense mass lesion surrounds the superior sagittal sinus. This most likely represents a metastasis. Meningioma is considered less likely. 2. Other hyperdense lesions are present in the cortical or subcortical left parietal lobe, left occipital lobe, and right temporal lobe concerning for metastatic disease. 3. There is mass effect on the posterior frontal and parietal lobes bilaterally. 4. Recommend MRI the brain without and contrast for further evaluation of these lesions. 5. Chronic right maxillary sinus disease. These results were called by telephone at the time of interpretation on 11/26/2021 at 1:30 pm to provider Dr. Melina Copa, who verbally acknowledged these results. Electronically Signed   By: San Morelle M.D.   On: 11/26/2021 13:46   CT CHEST ABDOMEN PELVIS W CONTRAST  Result Date: 11/26/2021 CLINICAL DATA:  Persistent cough, shortness of breath EXAM: CT CHEST, ABDOMEN, AND PELVIS WITH CONTRAST TECHNIQUE: Multidetector CT imaging of the chest, abdomen and pelvis was performed following the standard protocol during bolus administration of intravenous contrast. CONTRAST:  166mL OMNIPAQUE IOHEXOL 300 MG/ML  SOLN COMPARISON:  Previous studies including chest radiographs done on 11/24/2021 FINDINGS: CT CHEST FINDINGS Cardiovascular: Coronary artery calcifications are seen. There is homogeneous enhancement in thoracic aorta. There are no intraluminal filling defects in central pulmonary artery branches. There is extrinsic compression of right main pulmonary artery caused by pathologically enlarged lymph nodes in mediastinum and right hilum. There is extrinsic pressure over the superior vena cava caused by lymphadenopathy. Mediastinum/Nodes: There is bulky lymphadenopathy in  the mediastinum extending to the right hilum measuring approximately 8.6 x 8.6 x 9.5 cm. There is 2.6 x 1.9 cm node in the left hilum. Lungs/Pleura: Centrilobular emphysema is seen. In image 54 of series 4, there is 2.9 x 2.4 cm pleural-based noncalcified nodule in the right upper lobe. There are increased interstitial markings in the right upper lobe. In the image 71, there is 12 mm nodular density in the right upper lobe. In the image 73, there is 6 mm nodule in the right upper lobe. There are 3 small nodular densities each measuring less than 9 mm in size in the right lower lobe. Musculoskeletal: Unremarkable. CT ABDOMEN PELVIS FINDINGS Hepatobiliary: Liver measures 17.5 cm in length. In the image 58 of series 2,  there is 1.6 cm fluid density lesion in the left lobe, possibly cyst. There are other scattered low-density foci in the right lobe some of which may be cysts. Possibility of space-occupying neoplastic process is not excluded. There are slightly enlarged lymph nodes anterior to the liver and adjacent to the pericardium largest measuring 2.7 x 1.7 cm. There is no dilation of bile ducts. Gallbladder is not distended. Pancreas: Head of the pancreas is prominent in size. There is no dilation of pancreatic duct. No focal abnormality is seen in the body and tail of pancreas. Spleen: Spleen is not enlarged. Adrenals/Urinary Tract: There is 3 cm nodule in the left adrenal. There is 2 cm nodule in the right adrenal. There is no hydronephrosis. There are no renal or ureteral stones. There are smooth marginated nodular densities adjacent to the kidneys largest measuring 3.6 cm posterior to the upper pole of right kidney. Density measurements in this lesions are higher than usual for simple cysts. Ureters not dilated. Urinary bladder is not distended. Stomach/Bowel: Stomach is unremarkable. Small bowel loops are not dilated. Appendix is not dilated. There is no significant wall thickening in colon.  Vascular/Lymphatic: There are scattered atherosclerotic plaques and calcifications in the aorta and its major branches. There are pathologically enlarged lymph nodes in the mesentery and retroperitoneum. Largest of these nodes is noted anterior to the transverse colon measuring 5.9 x 3 cm. There are multiple other smaller nodules of varying sizes in the abdomen in the mesentery and retroperitoneum. Reproductive: Prostate is not enlarged. There is fluid density in the the region of prostatic urethra. This may suggest previous prostate surgery. Other: In image 76 of series 2, there is a 12 mm subcutaneous nodule in the right anterior abdominal wall. Small bilateral inguinal hernias containing fat are seen. Musculoskeletal: Unremarkable. IMPRESSION: There is 2.9 cm lobulated noncalcified pleural-based nodule in the right upper lobe suggesting possible primary malignant neoplasm. There are pathologically enlarged bulky lymph nodes in the mediastinum and both hilar regions, more so on the right side suggesting metastatic lymphadenopathy. There is extrinsic pressure over the right main pulmonary artery and superior vena cava by the enlarged lymph nodes in mediastinum. Increased interstitial markings and small scattered nodules are seen in the right lung which may be part of pneumonia or pulmonary metastatic disease. There are nodular densities in both adrenals largest measuring 3 cm in the left adrenal suggesting possible metastatic disease. There are enlarged lymph nodes in the retroperitoneum and mesentery largest measuring 5.9 cm in maximum diameter suggesting metastatic lymphadenopathy. There are multiple cysts in the liver. There are other indeterminate low-density lesions of varying sizes in the liver. Possibility of hepatic metastatic disease is not excluded. Head of the pancreas is enlarged in size with lobulated margins. Possibility of neoplastic process in the head of the pancreas is not excluded. Follow-up  PET-CT and biopsy as warranted should be considered. Coronary artery calcifications are seen. Other findings as described in the body of the report. Electronically Signed   By: Elmer Picker M.D.   On: 11/26/2021 13:49    EKG: Independently reviewed.  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 SIADH in setting of apparent new onset metastatic lung CA? Sodium 117 down from 118 after 1L LR bolus in ED Will check urine lytes and OSM to confirm SIADH. Fluid restrict for  the moment in the meantime BMP Q6H to trend Metastatic cancer - ? Lung primary with mets to adrenals and brain? On Decadron for brain mets Non-operable according to NS (see Dr. Lorin Mercy' note) No seizure PPX recommended (Also see Dr. Lorin Mercy' note) Unable to tolerate MRI (got about half way through). Consider repeating MRI / MRV with full sedation tomorrow if needed IR consult for biopsy for tissue diagnosis Call Onc + Rad Onc in AM If indeed this is small cell lung CA primary with SIADH then the brain mets should be quite radio sensitive. DM2 - Mod scale SSI AC  DVT prophylaxis: SCDs - at least until MRI can confirm the hyperdense brain mets are not hemorrhagic, also ? Involvement of superior sagittal sinus on CT head, MRV also ordered Code Status: Full Family Communication: Wife at bedside Disposition Plan: Home after diagnosis and treatment Consults called: IR Admission status: Admit to inpatient  Severity of Illness: The appropriate patient status for this patient is INPATIENT. Inpatient status is judged to be reasonable and necessary in order to provide the required intensity of service to ensure the patient's safety. The patient's presenting symptoms, physical exam findings, and initial radiographic and laboratory data in the context of their chronic  comorbidities is felt to place them at high risk for further clinical deterioration. Furthermore, it is not anticipated that the patient will be medically stable for discharge from the hospital within 2 midnights of admission.   * I certify that at the point of admission it is my clinical judgment that the patient will require inpatient hospital care spanning beyond 2 midnights from the point of admission due to high intensity of service, high risk for further deterioration and high frequency of surveillance required.*   Jasiyah Paulding M. DO [facility]  How to contact the [facility] Attending or Consulting provider [place] or covering provider during after hours [place], for this patient?  Check the care team in [facility] and look for a) attending/consulting [facility] provider listed and b) the [facility] team listed Log into [email or web address]  [employer] Physician Scheduling and messaging for groups and whole hospitals  On call and physician scheduling software for group practices, residents, hospitalists and other medical providers for call, clinic, rotation and shift schedules. On[employer] is a hospital-wide system for scheduling doctors and paging doctors on call. EasyPlot is for scientific plotting and data analysis.  [email or web address]  and use [facility]'s universal password to access. If you do not have the password, please contact the hospital operator.  Locate the [facility] provider you are looking for under [facility] and page to a number that you can be directly reached. If you still have difficulty reaching the provider, please page the [facility] (Director on Call) for the Hospitalists listed on amion for assistance.  11/27/2021, 12:59 AM

## 2021-11-27 NOTE — Consult Note (Signed)
Radiation Oncology         [phone] ________________________________  Initial inpatient Consultation  Name: Italo Banton MRN: [id number]  Date of Service: 11/26/2021 DOB: [birth year]  CC:[name], DO  No ref. provider found   REFERRING PHYSICIAN: No ref. 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    ICD-10-CM   1. Weakness  R53.1     2. Prostate cancer ([place])  C61 CT BIOPSY    CT BIOPSY    3. Lung mass  R91.8 CT BIOPSY    IR Radiologist Eval & Mgmt    CT BIOPSY    IR Radiologist Eval & Mgmt      HISTORY OF PRESENT ILLNESS: Armel Rabbani is a 65 y.o. male seen at the request of Dr. 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  The patient presented to the emergency department yesterday due to progressive weakness involving the lower extremities, history of COVID diagnosis 3 weeks ago and a weight loss of 20 pounds with headache, dizziness and imbalance.  Apparently he has a history of prostate cancer that was treated in 2017 with prostatectomy and has had a stable, low PSA since that time, monitored by his PCP and urology.  His hospitalization however is not due to his prostate cancer, and rather he had imaging performed yesterday of the head without contrast showing a 5.9 cm hypodense mass lesion around the superior sagittal sinus with mass-effect of the posterior frontal and parietal lobes bilaterally, a separate hyperdense focus in the cortical or subcortical left parietal lobe, similar changes seen in the left occipital lobe, and hypo-attenuation noted in the cerebellum with the largest lesion measured at 2.2 cm in that location.  Smaller cystic lesions in the cerebellum are also concerning for metastatic disease.  MRI is pending.  With these  findings a CT chest, abdomen and pelvis was performed, demonstrating a 2.9 cm noncalcified pleural-based nodule in the right upper lobe and bulky pathologically enlarged lymph nodes in the mediastinum, both hilar regions and more so on the right side to suggest metastatic adenopathy with extrinsic pressure over the right main pulmonary artery and superior vena cava.  Increased interstitial markings and small scattered nodules are seen in the right lung as well as nodular densities in both adrenal glands, with the largest in the left adrenal gland measuring 3 cm.  There is also adenopathy in the retroperitoneum and small bowel mesentery measuring 5.9 cm and multiple cysts in the liver with other indeterminate low-density lesions of varying sizes in the liver, possibly metastatic disease and an enlarged head of the pancreas with lobulated margins.  He is currently in the ICU while they are managing hyponatremia. He was started on dexamethasone for his brain metastases and Dr. Ronnald Ramp of neurosurgery was consulted. Surgical resection is not recommended at this time.  Radiation Oncology has been consulted today to discuss next steps for diagnosis and treatment.  CT 11/26/21     PREVIOUS RADIATION THERAPY: No  PAST MEDICAL HISTORY:  Past Medical History:  Diagnosis Date   Ascites    Diabetes mellitus without complication ([place])    Hernia of abdominal cavity    Ileus ([facility])    Prostate cancer ([place])       PAST SURGICAL HISTORY: Past Surgical History:  Procedure Laterality Date  PROSTATE SURGERY      FAMILY HISTORY:  Family History  Problem Relation Age of Onset   Cancer Neg Hx     SOCIAL HISTORY:  Social History   Socioeconomic History   Marital status: Divorced    Spouse name: Not on file   Number of children: Not on file   Years of education: Not on file   Highest education level: Not on file  Occupational History   Not on file  Tobacco Use   Smoking status: Every Day    Packs/day:  0.50    Types: Cigarettes   Smokeless tobacco: Never  Substance and Sexual Activity   Alcohol use: Yes    Comment: occ   Drug use: No   Sexual activity: Not on file  Other Topics Concern   Not on file  Social History Narrative   Not on file   Social Determinants of Health   Financial Resource Strain: Not on file  Food Insecurity: Not on file  Transportation Needs: Not on file  Physical Activity: Not on file  Stress: Not on file  Social Connections: Not on file  Intimate Partner Violence: Not on file  The patient is in a relationship with Ok Edwards.  He lives in [place].  He has worked in Orthoptist.   ALLERGIES: Patient has no known allergies.  MEDICATIONS:  Current Facility-Administered Medications  Medication Dose Route Frequency Provider Last Rate Last Admin   acetaminophen (TYLENOL) tablet 650 mg  650 mg Oral Q6H PRN Etta Quill, DO       Or   acetaminophen (TYLENOL) suppository 650 mg  650 mg Rectal Q6H PRN Etta Quill, DO       alum & mag hydroxide-simeth (MAALOX/MYLANTA) 200-200-20 MG/5ML suspension 30 mL  30 mL Oral Q6H PRN Shawna Clamp, MD   30 mL at 11/27/21 1324   chlorhexidine (PERIDEX) 0.12 % solution 15 mL  15 mL Mouth Rinse BID Karmen Bongo, MD   15 mL at 11/27/21 9470   Chlorhexidine Gluconate Cloth 2 % PADS 6 each  6 each Topical Daily Karmen Bongo, MD   6 each at 11/27/21 0941   dexamethasone (DECADRON) injection 4 mg  4 mg Intravenous Q6H Sponseller, Rebekah R, PA-C   4 mg at 11/27/21 1155   docusate sodium (COLACE) capsule 100 mg  100 mg Oral BID PRN Shawna Clamp, MD       hydrALAZINE (APRESOLINE) injection 10 mg  10 mg Intravenous Q8H PRN Shawna Clamp, MD       insulin aspart (novoLOG) injection 0-15 Units  0-15 Units Subcutaneous TID WC Etta Quill, DO   3 Units at 11/27/21 1155   LORazepam (ATIVAN) injection 1 mg  1 mg Intravenous Once Sponseller, Rebekah R, PA-C       LORazepam (ATIVAN) injection 1 mg  1 mg  Intravenous Once Shawna Clamp, MD       MEDLINE mouth rinse  15 mL Mouth Rinse q12n4p Karmen Bongo, MD       nicotine (NICODERM CQ - dosed in mg/24 hours) patch 21 mg  21 mg Transdermal Once Sponseller, Rebekah R, PA-C   21 mg at 11/26/21 1451   ondansetron (ZOFRAN) tablet 4 mg  4 mg Oral Q6H PRN Etta Quill, DO       Or   ondansetron [facility]) injection 4 mg  4 mg Intravenous Q6H PRN Etta Quill, DO       pantoprazole (PROTONIX) EC tablet 40  mg  40 mg Oral Daily Shawna Clamp, MD        REVIEW OF SYSTEMS:  On review of systems, the patient reports that he is feeling well in general and eager to go home. His weakness, dizziness and headaches have resolved since starting steroids and he denies chest pain, shortness of breath, or hemoptysis. He has had a productive cough but this has remained unchanged recently. He denies dyspnea on exertion or facial swelling. He reports a 20 pound weight loss secondary to decreased appetite for the past month or more.  He denies abdominal pain, vomiting, diarrhea or constipation.    PHYSICAL EXAM:  Wt Readings from Last 3 Encounters:  11/26/21 235 lb (106.6 kg)  01/01/19 271 lb 2.7 oz (123 kg)  12/28/18 273 lb (123.8 kg)   Temp Readings from Last 3 Encounters:  11/27/21 97.7 F (36.5 C) (Oral)  01/01/19 98.8 F (37.1 C) (Oral)  12/28/18 97.7 F (36.5 C) (Oral)   BP Readings from Last 3 Encounters:  11/27/21 93/64  01/01/19 (!) 165/89  12/28/18 (!) 161/77   Pulse Readings from Last 3 Encounters:  11/27/21 94  01/01/19 84  12/28/18 82   Pain Assessment Pain Score: 0-No pain/10 96% on Room Air   In general this is a well appearing Caucasian male in no acute distress.  He's alert and oriented x4 and appropriate throughout the examination. Cardiopulmonary assessment is negative for acute distress and he exhibits normal effort.    KPS = 90  100 - Normal; no complaints; no evidence of disease. 90   - Able to carry on normal  activity; minor signs or symptoms of disease. 80   - Normal activity with effort; some signs or symptoms of disease. 42   - Cares for self; unable to carry on normal activity or to do active work. 60   - Requires occasional assistance, but is able to care for most of his personal needs. 50   - Requires considerable assistance and frequent medical care. 66   - Disabled; requires special care and assistance. 46   - Severely disabled; hospital admission is indicated although death not imminent. 64   - Very sick; hospital admission necessary; active supportive treatment necessary. 10   - Moribund; fatal processes progressing rapidly. 0     - Dead  Karnofsky DA, Abelmann [place], Craver LS and Burchenal [facility] [phone]) The use of the nitrogen mustards in the palliative treatment of carcinoma: with particular reference to bronchogenic carcinoma Cancer 1 634-56  LABORATORY DATA:  Lab Results  Component Value Date   WBC 8.7 11/26/2021   HGB 12.0 (L) 11/26/2021   HCT 34.3 (L) 11/26/2021   MCV 90.5 11/26/2021   PLT 247 11/26/2021   Lab Results  Component Value Date   NA 121 (L) 11/27/2021   K 4.3 11/27/2021   CL 89 (L) 11/27/2021   CO2 23 11/27/2021   Lab Results  Component Value Date   ALT 15 11/26/2021   AST 27 11/26/2021   ALKPHOS 69 11/26/2021   BILITOT 0.6 11/26/2021     RADIOGRAPHY: CT Head Wo Contrast  Result Date: 11/26/2021 CLINICAL DATA:  Dizziness and disequilibrium. Symptoms for 1 month. COVID 3 weeks ago. Prostate cancer with new lung mass. EXAM: CT HEAD WITHOUT CONTRAST TECHNIQUE: Contiguous axial images were obtained from the base of the skull through the vertex without intravenous contrast. COMPARISON:  None. FINDINGS: Brain: A midline extra-axial hyperdense mass lesion surrounds the superior sagittal sinus. The lesion  measures 5.9 x 3.5 x 4.4 cm. An area of hypoattenuation is present in the posterior right frontal lobe adjacent to the lesion. There is mass effect on the posterior  frontal and parietal lobes bilaterally. Separate hyperdense focus is present in the cortical or subcortical left parietal lobe on axial image 24 of series 5. Similar subcortical hyperdensity is present in the left occipital lobe on axial image 17 of series 5. Areas of hypoattenuation are present in the cerebellum. The largest is in midline with some surrounding hyperdensity, also concerning for a metastatic lesion. It measures 2.2 x 2.1 cm on saddle sagittal image 29 of series 4. Smaller hypodense left cystic lesions the cerebellum are also concerning metastases. Infarct is considered less likely. A hypodense lesion in the right temporal lobe measures 11 mm. The ventricles are of normal size.  No significant fluid is present Vascular: No hyperdense vessel or unexpected calcification. Skull: Calvarium is intact. No focal lytic or blastic lesions are present. No significant extracranial soft tissue lesion is present. Sinuses/Orbits: Right maxillary sinus opacification is likely chronic. There is some wall thickening. Minimal air is present in the sinus. The paranasal sinuses and mastoid air cells are otherwise clear. The globes and orbits are within normal limits. IMPRESSION: 1. 5.9 x 3.5 x 4.4 cm midline extra-axial hyperdense mass lesion surrounds the superior sagittal sinus. This most likely represents a metastasis. Meningioma is considered less likely. 2. Other hyperdense lesions are present in the cortical or subcortical left parietal lobe, left occipital lobe, and right temporal lobe concerning for metastatic disease. 3. There is mass effect on the posterior frontal and parietal lobes bilaterally. 4. Recommend MRI the brain without and contrast for further evaluation of these lesions. 5. Chronic right maxillary sinus disease. These results were called by telephone at the time of interpretation on 11/26/2021 at 1:30 pm to provider Dr. Melina Copa, who verbally acknowledged these results. Electronically Signed   By:  San Morelle M.D.   On: 11/26/2021 13:46   CT CHEST ABDOMEN PELVIS W CONTRAST  Result Date: 11/26/2021 CLINICAL DATA:  Persistent cough, shortness of breath EXAM: CT CHEST, ABDOMEN, AND PELVIS WITH CONTRAST TECHNIQUE: Multidetector CT imaging of the chest, abdomen and pelvis was performed following the standard protocol during bolus administration of intravenous contrast. CONTRAST:  121mL OMNIPAQUE IOHEXOL 300 MG/ML  SOLN COMPARISON:  Previous studies including chest radiographs done on 11/24/2021 FINDINGS: CT CHEST FINDINGS Cardiovascular: Coronary artery calcifications are seen. There is homogeneous enhancement in thoracic aorta. There are no intraluminal filling defects in central pulmonary artery branches. There is extrinsic compression of right main pulmonary artery caused by pathologically enlarged lymph nodes in mediastinum and right hilum. There is extrinsic pressure over the superior vena cava caused by lymphadenopathy. Mediastinum/Nodes: There is bulky lymphadenopathy in the mediastinum extending to the right hilum measuring approximately 8.6 x 8.6 x 9.5 cm. There is 2.6 x 1.9 cm node in the left hilum. Lungs/Pleura: Centrilobular emphysema is seen. In image 54 of series 4, there is 2.9 x 2.4 cm pleural-based noncalcified nodule in the right upper lobe. There are increased interstitial markings in the right upper lobe. In the image 71, there is 12 mm nodular density in the right upper lobe. In the image 73, there is 6 mm nodule in the right upper lobe. There are 3 small nodular densities each measuring less than 9 mm in size in the right lower lobe. Musculoskeletal: Unremarkable. CT ABDOMEN PELVIS FINDINGS Hepatobiliary: Liver measures 17.5 cm in length. In the  image 58 of series 2, there is 1.6 cm fluid density lesion in the left lobe, possibly cyst. There are other scattered low-density foci in the right lobe some of which may be cysts. Possibility of space-occupying neoplastic process is not  excluded. There are slightly enlarged lymph nodes anterior to the liver and adjacent to the pericardium largest measuring 2.7 x 1.7 cm. There is no dilation of bile ducts. Gallbladder is not distended. Pancreas: Head of the pancreas is prominent in size. There is no dilation of pancreatic duct. No focal abnormality is seen in the body and tail of pancreas. Spleen: Spleen is not enlarged. Adrenals/Urinary Tract: There is 3 cm nodule in the left adrenal. There is 2 cm nodule in the right adrenal. There is no hydronephrosis. There are no renal or ureteral stones. There are smooth marginated nodular densities adjacent to the kidneys largest measuring 3.6 cm posterior to the upper pole of right kidney. Density measurements in this lesions are higher than usual for simple cysts. Ureters not dilated. Urinary bladder is not distended. Stomach/Bowel: Stomach is unremarkable. Small bowel loops are not dilated. Appendix is not dilated. There is no significant wall thickening in colon. Vascular/Lymphatic: There are scattered atherosclerotic plaques and calcifications in the aorta and its major branches. There are pathologically enlarged lymph nodes in the mesentery and retroperitoneum. Largest of these nodes is noted anterior to the transverse colon measuring 5.9 x 3 cm. There are multiple other smaller nodules of varying sizes in the abdomen in the mesentery and retroperitoneum. Reproductive: Prostate is not enlarged. There is fluid density in the the region of prostatic urethra. This may suggest previous prostate surgery. Other: In image 76 of series 2, there is a 12 mm subcutaneous nodule in the right anterior abdominal wall. Small bilateral inguinal hernias containing fat are seen. Musculoskeletal: Unremarkable. IMPRESSION: There is 2.9 cm lobulated noncalcified pleural-based nodule in the right upper lobe suggesting possible primary malignant neoplasm. There are pathologically enlarged bulky lymph nodes in the mediastinum  and both hilar regions, more so on the right side suggesting metastatic lymphadenopathy. There is extrinsic pressure over the right main pulmonary artery and superior vena cava by the enlarged lymph nodes in mediastinum. Increased interstitial markings and small scattered nodules are seen in the right lung which may be part of pneumonia or pulmonary metastatic disease. There are nodular densities in both adrenals largest measuring 3 cm in the left adrenal suggesting possible metastatic disease. There are enlarged lymph nodes in the retroperitoneum and mesentery largest measuring 5.9 cm in maximum diameter suggesting metastatic lymphadenopathy. There are multiple cysts in the liver. There are other indeterminate low-density lesions of varying sizes in the liver. Possibility of hepatic metastatic disease is not excluded. Head of the pancreas is enlarged in size with lobulated margins. Possibility of neoplastic process in the head of the pancreas is not excluded. Follow-up PET-CT and biopsy as warranted should be considered. Coronary artery calcifications are seen. Other findings as described in the body of the report. Electronically Signed   By: Elmer Picker M.D.   On: 11/26/2021 13:49      IMPRESSION/PLAN: 1. 65 y.o. 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  We have reviewed his case and imaging and Dr. Tammi Klippel has formulated the plan of care. Today we discussed the CT findings and the work up thus far. We discussed the importance of tissue confirmation of the suspected disease  and the role for palliative radiotherapy to the chest, and likely to the brain depending on his upcoming MRI. We will await the MRI brain results to help guide treatment recommendations for the disease in the brain. We discussed the risks, benefits, short, and long term effects of radiotherapy, as well as the  palliative intent, and the patient is interested in proceeding. We reviewed the delivery and logistics of radiotherapy and Dr. Tammi Klippel anticipates a course of 2 weeks of radiotherapy to the chest, with plans to begin treatment as soon as we have tissue confirmation unless the patient becomes symptomatic in the interim and there is a need for more urgent treatment.  We are hopeful that he will have biopsy prior to the weekend so that we can begin the radiation as soon as possible. He has freely signed written consent to proceed today and a copy of this document will be placed in his medical record.  We have him tentatively scheduled for CT simulation/treatment planning tomorrow morning at 9 AM in anticipation of starting treatment as soon as we have tissue to confirm diagnosis or would weigh the risks and benefits of starting more urgently without tissue if his clinical situation deteriorated.  We appreciate the inpatient team's care for the patient and agree with continuing dexamethasone, but will modify the dose to TID rather than QID.  2. 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 The patient is s/p prostatectomy and continues to be followed by urology for PSA monitoring. His recent PSA obtained yesterday remains stable at 0.02.     Nicholos Johns, PA-C    Tyler Pita, MD  [facility] Direct Dial: [phone]  Fax: [phone] [email or web address]  Skype  [email or web address]

## 2021-11-28 ENCOUNTER — Ambulatory Visit
Admit: 2021-11-28 | Discharge: 2021-11-28 | Disposition: A | Discharge status: Home / Self Care | Payer: BC Managed Care – PPO | Attending: Radiation Oncology | Admitting: Radiation Oncology

## 2021-11-28 ENCOUNTER — Inpatient Hospital Stay (HOSPITAL_COMMUNITY): Payer: BC Managed Care – PPO

## 2021-11-28 ENCOUNTER — Telehealth (HOSPITAL_COMMUNITY): Payer: Self-pay | Admitting: *Deleted

## 2021-11-28 DIAGNOSIS — Z510 Encounter for antineoplastic radiation therapy: Secondary | ICD-10-CM | POA: Insufficient documentation

## 2021-11-28 DIAGNOSIS — C3491 Malignant neoplasm of unspecified part of right bronchus or lung: Secondary | ICD-10-CM

## 2021-11-28 DIAGNOSIS — C787 Secondary malignant neoplasm of liver and intrahepatic bile duct: Secondary | ICD-10-CM | POA: Insufficient documentation

## 2021-11-28 DIAGNOSIS — C7970 Secondary malignant neoplasm of unspecified adrenal gland: Secondary | ICD-10-CM | POA: Insufficient documentation

## 2021-11-28 DIAGNOSIS — E871 Hypo-osmolality and hyponatremia: Secondary | ICD-10-CM | POA: Diagnosis not present

## 2021-11-28 DIAGNOSIS — C7931 Secondary malignant neoplasm of brain: Secondary | ICD-10-CM | POA: Insufficient documentation

## 2021-11-28 HISTORY — PX: IR IMAGING GUIDED PORT INSERTION: IMG5740

## 2021-11-28 LAB — BASIC METABOLIC PANEL
Anion gap: 9 (ref 5–15)
BUN: 17 mg/dL (ref 8–23)
CO2: 24 mmol/L (ref 22–32)
Calcium: 9.3 mg/dL (ref 8.9–10.3)
Chloride: 92 mmol/L — ABNORMAL LOW (ref 98–111)
Creatinine, Ser: 0.53 mg/dL — ABNORMAL LOW (ref 0.61–1.24)
GFR, Estimated: >60 mL/min (ref >60)
Glucose, Bld: 126 mg/dL — ABNORMAL HIGH (ref 70–99)
Potassium: 4.1 mmol/L (ref 3.5–5.1)
Sodium: 125 mmol/L — ABNORMAL LOW (ref 135–145)

## 2021-11-28 LAB — GLUCOSE, CAPILLARY
Glucose-Capillary: 129 mg/dL — ABNORMAL HIGH (ref 70–99)
Glucose-Capillary: 154 mg/dL — ABNORMAL HIGH (ref 70–99)
Glucose-Capillary: 145 mg/dL — ABNORMAL HIGH (ref 70–99)
Glucose-Capillary: 232 mg/dL — ABNORMAL HIGH (ref 70–99)

## 2021-11-28 LAB — PROTIME-INR
INR: 0.9 (ref 0.8–1.2)
Prothrombin Time: 12.4 seconds (ref 11.4–15.2)

## 2021-11-28 IMAGING — XA IR IMAGING GUIDED PORT INSERTION
2 series · 2 of 2 positions shown · non-contrast
Comparison: None.

INDICATION: 65-year-old with evidence of metastatic disease. Port-A-Cath needed
for therapy.

EXAM:
FLUOROSCOPIC AND ULTRASOUND GUIDED PLACEMENT OF A SUBCUTANEOUS PORT

[Series 1: ir imaging guided port insertion · 1 of 1 slices shown]
[im 1/1]
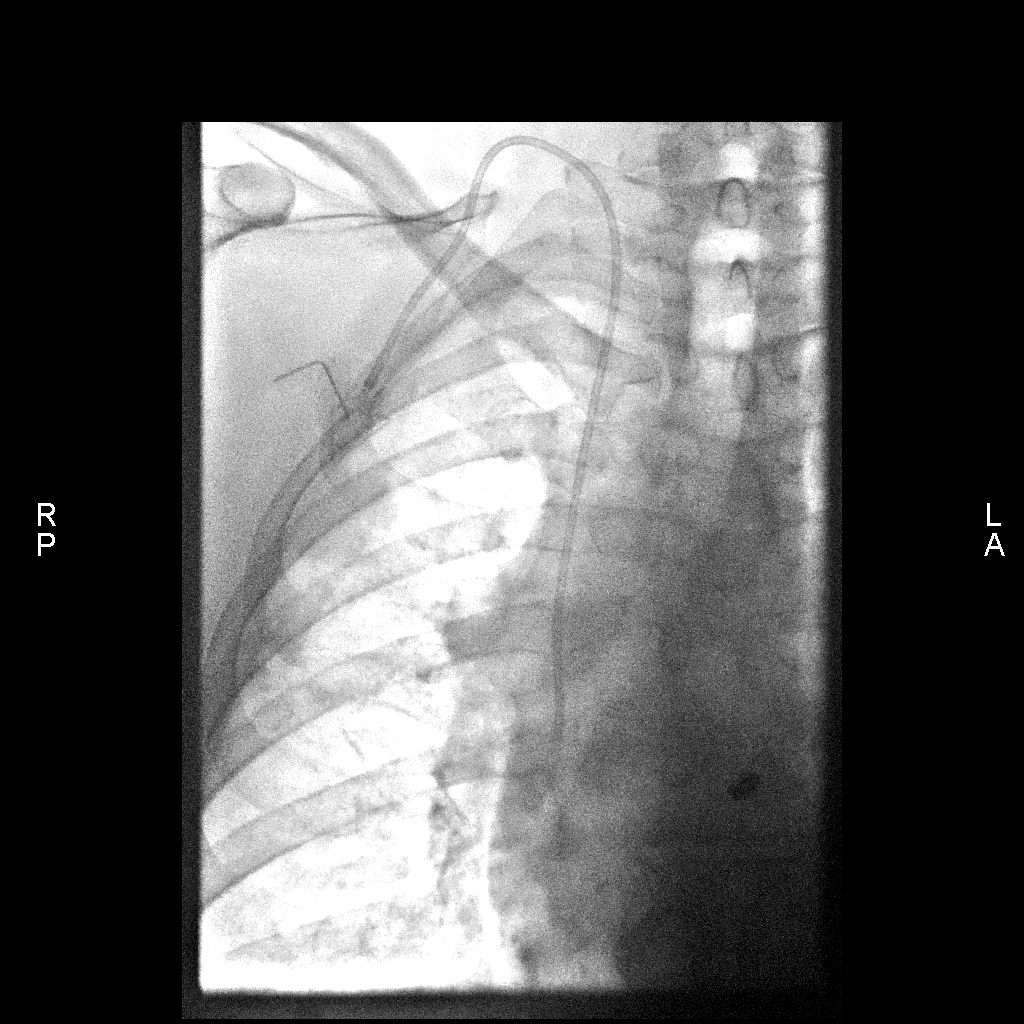

[Series 1: ir rad eval and mgt. · 1 of 1 slices shown]
[im 1/1]
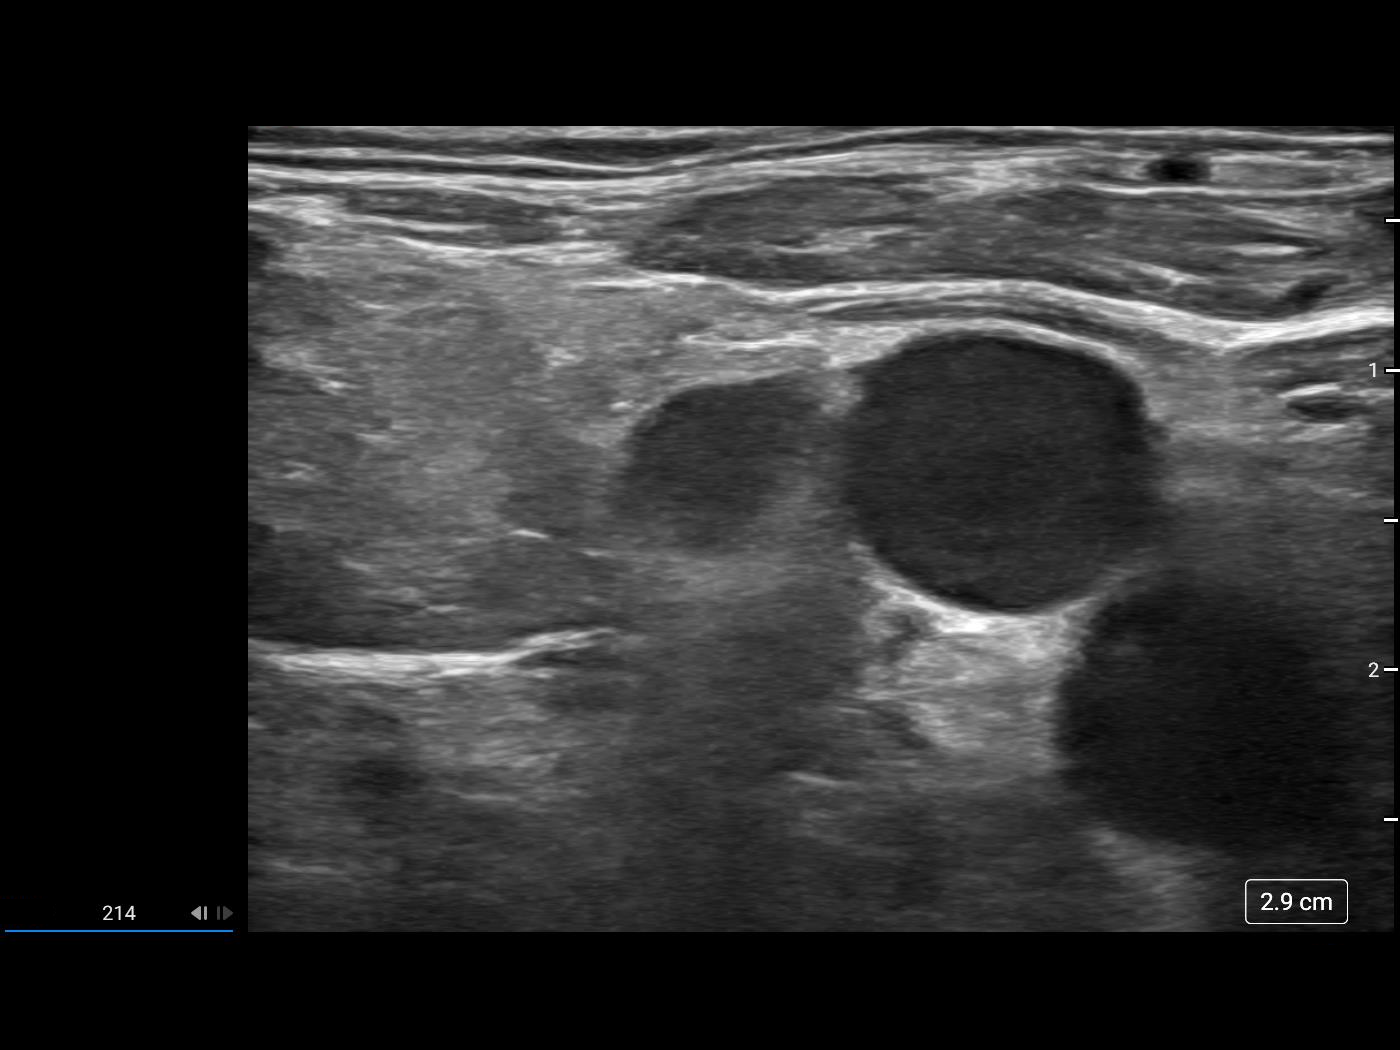

[2 of 2 positions shown; findings below may reference images not displayed]

MEDICATIONS:
Moderate sedation

ANESTHESIA/SEDATION:
Versed 2.0 mg IV; Fentanyl 100 mcg IV;

Moderate Sedation Time:  44 minutes

The patient was continuously monitored during the procedure by the
interventional radiology nurse under my direct supervision.

FLUOROSCOPY TIME:  24 seconds, 1 mGy

COMPLICATIONS:
None immediate.

PROCEDURE:
The procedure, risks, benefits, and alternatives were explained to
the patient. Questions regarding the procedure were encouraged and
answered. The patient understands and consents to the procedure.

Patient was placed supine on the interventional table. Ultrasound
confirmed a patent right internal jugular vein. Ultrasound image was
saved for documentation. The right chest and neck were cleaned with
a skin antiseptic and a sterile drape was placed. Maximal barrier
sterile technique was utilized including caps, mask, sterile gowns,
sterile gloves, sterile drape, hand hygiene and skin antiseptic. The
right neck was anesthetized with 1% lidocaine. Small incision was
made in the right neck with a blade. Micropuncture set was placed in
the right internal jugular vein with ultrasound guidance. The
micropuncture wire was used for measurement purposes. The right
chest was anesthetized with 1% lidocaine with epinephrine. #15 blade
was used to make an incision and a subcutaneous port pocket was
formed. 8 french Power Port was assembled. Subcutaneous tunnel was
formed with a stiff tunneling device. The port catheter was brought
through the subcutaneous tunnel. The port was placed in the
subcutaneous pocket. The micropuncture set was exchanged for a
peel-away sheath. The catheter was placed through the peel-away
sheath and the tip was positioned at the superior cavoatrial
junction. Catheter placement was confirmed with fluoroscopy. The
port was accessed and flushed with heparinized saline. The port
pocket was closed using two layers of absorbable sutures and
Dermabond. The vein skin site was closed using a single layer of
absorbable suture and Dermabond. Sterile dressings were applied.
Patient tolerated the procedure well without an immediate
complication. Ultrasound and fluoroscopic images were taken and
saved for this procedure.
IMPRESSION: Placement of a subcutaneous power-injectable port device. Catheter
tip at the superior cavoatrial junction.

## 2021-11-28 MED ORDER — MIDAZOLAM HCL 2 MG/2ML IJ SOLN
INTRAMUSCULAR | Status: AC | PRN
  Administered 2021-11-28 (×2): 1 mg via INTRAVENOUS

## 2021-11-28 MED ORDER — FENTANYL CITRATE (PF) 100 MCG/2ML IJ SOLN
INTRAMUSCULAR | Status: AC
  Filled 2021-11-28: qty 4

## 2021-11-28 MED ORDER — LIDOCAINE-EPINEPHRINE (PF) 2 %-1:200000 IJ SOLN
INTRAMUSCULAR | Status: AC
  Administered 2021-11-28: 10 mL
  Filled 2021-11-28: qty 20

## 2021-11-28 MED ORDER — NICOTINE 7 MG/24HR TD PT24
7.0000 mg | MEDICATED_PATCH | Freq: Every day | TRANSDERMAL | Status: DC
  Administered 2021-11-28 – 2021-12-05 (×8): 7 mg via TRANSDERMAL
  Filled 2021-11-28 (×8): qty 1

## 2021-11-28 MED ORDER — MIDAZOLAM HCL 2 MG/2ML IJ SOLN
INTRAMUSCULAR | Status: AC
  Filled 2021-11-28: qty 4

## 2021-11-28 MED ORDER — NALOXONE HCL 0.4 MG/ML IJ SOLN
INTRAMUSCULAR | Status: AC
  Filled 2021-11-28: qty 1

## 2021-11-28 MED ORDER — LIDOCAINE HCL 1 % IJ SOLN
INTRAMUSCULAR | Status: AC
  Administered 2021-11-28: 10 mL
  Filled 2021-11-28: qty 20

## 2021-11-28 MED ORDER — FENTANYL CITRATE (PF) 100 MCG/2ML IJ SOLN
INTRAMUSCULAR | Status: AC | PRN
  Administered 2021-11-28 (×2): 50 ug via INTRAVENOUS

## 2021-11-28 MED ORDER — HEPARIN SOD (PORK) LOCK FLUSH 100 UNIT/ML IV SOLN
INTRAVENOUS | Status: AC
  Filled 2021-11-28: qty 5

## 2021-11-28 MED ORDER — FLUMAZENIL 0.5 MG/5ML IV SOLN
INTRAVENOUS | Status: AC
  Filled 2021-11-28: qty 5

## 2021-11-28 MED ORDER — ENSURE ENLIVE PO LIQD
237.0000 mL | Freq: Two times a day (BID) | ORAL | Status: DC
  Administered 2021-11-29 – 2021-11-30 (×4): 237 mL via ORAL

## 2021-11-28 NOTE — Progress Notes (Signed)
PROGRESS NOTE    Ryan [name]  SJG:[id number] DOB: Apr 24, 1956 DOA: 11/26/2021 PCP: [name], DO   Brief Narrative:  This 65 years old male with PMH significant for prostate cancer, type 2 diabetes presented in the ED with 3 weeks history of progressive weakness, decreased appetite, 20 pound weight loss.  Patient also been experiencing episodes of severe dizziness and disequilibrium. He reports he was diagnosed with COVID 3 weeks ago.  Work-up in the ED shows sodium 118.  CT chest showed 2.9 cm lobulated noncalcified pleural-based nodule in the right upper lobe suggesting possible primary malignant neoplasm.  There is multiple lesions noted in the brain, adrenal gland and nearby lymph nodes suggesting metastasis. 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  Sodium is slowly improving with fluid restriction.  Oncology is consulted recommended dexamethasone, MRI brain confirms mets.  Patient underwent CT-guided biopsy of abdominal peritoneal mass.  Patient also need Port-A-Cath placement for chemotherapy.  Assessment & Plan:   Principal Problem:   Acute hyponatremia Active Problems:   Metastatic cancer to brain ([place])   Lung cancer ([place])   DM2 (diabetes mellitus, type 2) ([facility])  Hyponatremia: Could be due to SIADH in the setting of apparent new metastatic lung CA. Sodium slowly improving with fluid restriction. Continue to monitor 118> 117> 120> 121> 123> 124> 125.  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 IR consulted for biopsy of peritoneal lesion. Oncology is consulted recommended to continue dexamethasone. Oncology also recommended radiation oncology consult for radiation to the brain and possibly chest due to impending SVC  syndrome. MRI brain confirms the metastatic lesions in the brain.Invasion and obliteration of the posterior superior sagittal sinus by the above described dural based mass  History of prostate cancer: S/p prostatectomy with normal PSA.  Diabetes mellitus: Continue regular insulin sliding scale.  Goals of care discussion.: Patient does have stage IV lung cancer with metastasis. Patient needs goals of care discussion about prognosis.  DVT prophylaxis:  SCDs Code Status: Full code. Family Communication: Wife in the room. Disposition Plan:  Status is: Inpatient  Remains inpatient appropriate because: Hyponatremia, metastatic lung cancer requiring complete work-up,  underwent biopsy of the peritoneal mass.   Consultants:  Quality, radiation oncology  Procedures: CT guided biopsy of the peritoneal mass. Antimicrobials:   Anti-infectives (From admission, onward)    None       Subjective: Patient was seen and examined at bedside.  Overnight events noted.  Patient has slept overnight with Ambien given last night.   Patient is scheduled to have a CT-guided biopsy of peritoneal mass followed by port-a - cath placement.  Objective: Vitals:   11/28/21 0925 11/28/21 0926 11/28/21 0930 11/28/21 0935  BP: (!) 157/85  (!) 143/105 (!) 160/86  Pulse:  84 81 81  Resp:  11 (!) 4 16  Temp:      TempSrc:      SpO2:  100% 100% 100%  Weight:      Height:       No intake or output data in the 24 hours ending 11/28/21 1034 Filed Weights   11/26/21 0924  Weight: 106.6 kg    Examination:  General exam: Appears comfortable, not in any acute distress.  Deconditioned. Respiratory system: Clear to auscultation bilaterally. Respiratory effort  normal. Cardiovascular system: S1-S2 heard, regular rate and rhythm, no murmur. Gastrointestinal system: Abdomen is soft, nontender, nondistended, BS +. Central nervous system: Alert and oriented x 3. No focal neurological deficits. Extremities: No  edema, no cyanosis, no clubbing. Skin: No rashes, lesions or ulcers Psychiatry: Judgement and insight appear normal. Mood & affect appropriate.     Data Reviewed: I have personally reviewed following labs and imaging studies  CBC: Recent Labs  Lab 11/26/21 1130  WBC 8.7  NEUTROABS 7.2  HGB 12.0*  HCT 34.3*  MCV 90.5  PLT 284   Basic Metabolic Panel: Recent Labs  Lab 11/26/21 1130 11/26/21 1329 11/27/21 0117 11/27/21 0657 11/27/21 1302 11/27/21 1830 11/28/21 0023  NA 118*   < > 120* 121* 123* 124* 125*  K 4.2  --  4.1 4.3 4.0 4.7 4.1  CL 86*  --  90* 89* 89* 90* 92*  CO2 23  --  20* 23 24 25 24   GLUCOSE 119*  --  149* 180* 187* 151* 126*  BUN 12  --  11 12 14 16 17   CREATININE 0.43*  --  0.37* 0.45* 0.56* 0.61 0.53*  CALCIUM 8.8*  --  9.0 8.9 9.3 9.4 9.3  MG 1.6*  --   --   --   --   --   --    < > = values in this interval not displayed.   GFR: Estimated Creatinine Clearance: 116.6 mL/min (A) (by C-G formula based on SCr of 0.53 mg/dL (L)). Liver Function Tests: Recent Labs  Lab 11/26/21 1130  AST 27  ALT 15  ALKPHOS 69  BILITOT 0.6  PROT 7.1  ALBUMIN 3.7   Recent Labs  Lab 11/26/21 1130  LIPASE 114*   No results for input(s): AMMONIA in the last 168 hours. Coagulation Profile: Recent Labs  Lab 11/28/21 0023  INR 0.9   Cardiac Enzymes: No results for input(s): CKTOTAL, CKMB, CKMBINDEX, TROPONINI in the last 168 hours. BNP (last 3 results) No results for input(s): PROBNP in the last 8760 hours. HbA1C: Recent Labs    11/27/21 0117  HGBA1C 5.9*   CBG: Recent Labs  Lab 11/27/21 0823 11/27/21 1134 11/27/21 1648 11/27/21 2154 11/28/21 0731  GLUCAP 190* 155* 173* 146* 129*   Lipid Profile: No results for input(s): CHOL, HDL, LDLCALC, TRIG, CHOLHDL, LDLDIRECT in the last 72 hours. Thyroid Function Tests: No results for input(s): TSH, T4TOTAL, FREET4, T3FREE, THYROIDAB in the last 72 hours. Anemia Panel: No results for input(s):  VITAMINB12, FOLATE, FERRITIN, TIBC, IRON, RETICCTPCT in the last 72 hours. Sepsis Labs: No results for input(s): PROCALCITON, LATICACIDVEN in the last 168 hours.  Recent Results (from the past 240 hour(s))  Resp Panel by RT-PCR (Flu A&B, Covid) Nasopharyngeal Swab     Status: Abnormal   Collection Time: 11/26/21  4:04 PM   Specimen: Nasopharyngeal Swab; Nasopharyngeal(NP) swabs in vial transport medium  Result Value Ref Range Status   SARS Coronavirus 2 by RT PCR POSITIVE (A) NEGATIVE Final    Comment: RESULT CALLED TO, READ BACK BY AND VERIFIED WITH: JAMIE BAILEY RN @1646  11/26/2021 OLSONM (NOTE) SARS-CoV-2 target nucleic acids are DETECTED.  The SARS-CoV-2 RNA is generally detectable in upper respiratory specimens during the acute phase of infection. Positive results are indicative of the presence of the identified virus, but do not rule out bacterial infection or co-infection with other pathogens not detected by the test. Clinical correlation with patient history and other diagnostic information is necessary to determine patient  infection status. The expected result is Negative.  Fact Sheet for Patients: EntrepreneurPulse.com.au  Fact Sheet for Healthcare Providers: IncredibleEmployment.be  This test is not yet approved or cleared by the [place] FDA and  has been authorized for detection and/or diagnosis of SARS-CoV-2 by FDA under an Emergency Use Authorization (EUA).  This EUA will remain in effect (meaning this test  can be used) for the duration of  the COVID-19 declaration under Section 564(b)(1) of the Act, 21 U.S.C. section 360bbb-3(b)(1), unless the authorization is terminated or revoked sooner.     Influenza A by PCR NEGATIVE NEGATIVE Final   Influenza B by PCR NEGATIVE NEGATIVE Final    Comment: (NOTE) The Xpert Xpress SARS-CoV-2/FLU/RSV plus assay is intended as an aid in the diagnosis of influenza from Nasopharyngeal  swab specimens and should not be used as a sole basis for treatment. Nasal washings and aspirates are unacceptable for Xpert Xpress SARS-CoV-2/FLU/RSV testing.  Fact Sheet for Patients: EntrepreneurPulse.com.au  Fact Sheet for Healthcare Providers: IncredibleEmployment.be  This test is not yet approved or cleared by the [place] FDA and has been authorized for detection and/or diagnosis of SARS-CoV-2 by FDA under an Emergency Use Authorization (EUA). This EUA will remain in effect (meaning this test can be used) for the duration of the COVID-19 declaration under Section 564(b)(1) of the Act, 21 U.S.C. section 360bbb-3(b)(1), unless the authorization is terminated or revoked.  Performed at [facility], [address] [place], [place] [address]   MRSA Next Gen by PCR, Nasal     Status: None   Collection Time: 11/26/21 11:44 PM   Specimen: Nasal Mucosa; Nasal Swab  Result Value Ref Range Status   MRSA by PCR Next Gen NOT DETECTED NOT DETECTED Final    Comment: (NOTE) The GeneXpert MRSA Assay (FDA approved for NASAL specimens only), is one component of a comprehensive MRSA colonization surveillance program. It is not intended to diagnose MRSA infection nor to guide or monitor treatment for MRSA infections. Test performance is not FDA approved in patients less than 35 years old. Performed at [facility], [place] [address] [place], [place] [address]     Radiology Studies: CT Head Wo Contrast  Result Date: 11/26/2021 CLINICAL DATA:  Dizziness and disequilibrium. Symptoms for 1 month. COVID 3 weeks ago. Prostate cancer with new lung mass. EXAM: CT HEAD WITHOUT CONTRAST TECHNIQUE: Contiguous axial images were obtained from the base of the skull through the vertex without intravenous contrast. COMPARISON:  None. FINDINGS: Brain: A midline extra-axial hyperdense mass lesion surrounds the superior sagittal sinus. The  lesion measures 5.9 x 3.5 x 4.4 cm. An area of hypoattenuation is present in the posterior right frontal lobe adjacent to the lesion. There is mass effect on the posterior frontal and parietal lobes bilaterally. Separate hyperdense focus is present in the cortical or subcortical left parietal lobe on axial image 24 of series 5. Similar subcortical hyperdensity is present in the left occipital lobe on axial image 17 of series 5. Areas of hypoattenuation are present in the cerebellum. The largest is in midline with some surrounding hyperdensity, also concerning for a metastatic lesion. It measures 2.2 x 2.1 cm on saddle sagittal image 29 of series 4. Smaller hypodense left cystic lesions the cerebellum are also concerning metastases. Infarct is considered less likely. A hypodense lesion in the right temporal lobe measures 11 mm. The ventricles are of normal size.  No significant fluid is present Vascular: No hyperdense vessel or  unexpected calcification. Skull: Calvarium is intact. No focal lytic or blastic lesions are present. No significant extracranial soft tissue lesion is present. Sinuses/Orbits: Right maxillary sinus opacification is likely chronic. There is some wall thickening. Minimal air is present in the sinus. The paranasal sinuses and mastoid air cells are otherwise clear. The globes and orbits are within normal limits. IMPRESSION: 1. 5.9 x 3.5 x 4.4 cm midline extra-axial hyperdense mass lesion surrounds the superior sagittal sinus. This most likely represents a metastasis. Meningioma is considered less likely. 2. Other hyperdense lesions are present in the cortical or subcortical left parietal lobe, left occipital lobe, and right temporal lobe concerning for metastatic disease. 3. There is mass effect on the posterior frontal and parietal lobes bilaterally. 4. Recommend MRI the brain without and contrast for further evaluation of these lesions. 5. Chronic right maxillary sinus disease. These results  were called by telephone at the time of interpretation on 11/26/2021 at 1:30 pm to provider Dr. Melina Copa, who verbally acknowledged these results. Electronically Signed   By: San Morelle M.D.   On: 11/26/2021 13:46   MR Brain W and Wo Contrast  Result Date: 11/28/2021 CLINICAL DATA:  Initial evaluation for brain mass. EXAM: MRI HEAD WITHOUT AND WITH CONTRAST MRV HEAD WITHOUT CONTRAST TECHNIQUE: Multiplanar, multiecho pulse sequences of the brain and surrounding structures were obtained without and with intravenous contrast. Angiographic images of the intracranial venous structures were obtained using MRV technique without intravenous contrast. CONTRAST:  69mL GADAVIST GADOBUTROL 1 MMOL/ML IV SOLN COMPARISON:  Prior CT from 11/26/2021. FINDINGS: MRI HEAD FINDINGS: Brain: Cerebral volume within normal limits. Multiple scattered intraparenchymal lesions are seen involving both cerebral hemispheres as well as the cerebellum, consistent with widespread metastatic disease. There are at least 20 lesions. For reference purposes, the largest lesion within the right cerebral hemisphere is present at the right temporal lobe in measures 1.5 x 1.2 x 0.7 cm (series 26, image 22). The largest lesion within the left cerebral hemisphere is positioned at the left occipital pole and measures 1.5 x 1.7 x 1.7 cm (series 26, image 21). Largest distinct infratentorial lesion is seen within the central cerebellum and measures 2.1 x 2.2 x 2.5 cm (series 26, image 12). Several of these lesions are somewhat cystic in appearance. Few lesions demonstrate intrinsic T1 hyperintensity and/or SWI signal, consistent with hemorrhage and/or blood products, most notable at the central cerebellum (series 11, image 11). Mild localized edema about several of these lesions without significant regional mass effect. An additional large enhancing extra-axial mass positioned at the posterior aspect of the superior sagittal sinus measures 3.2 x 6.6 x  4.8 cm (series 28, image 13). Scattered areas of internal susceptibility artifact consistent with blood products. Mild localized vasogenic edema without significant mass effect. This lesion is somewhat indeterminate, and could reflect an additional dural-based metastasis. Concomitant meningioma could also be considered. This lesion invades and obliterates the adjacent superior sagittal sinus, better evaluated on concomitant MRV. Mild diffuse smooth pachymeningeal thickening and enhancement is likely secondary. No definite evidence for acute or subacute ischemia. Note made of a punctate focus of diffusion abnormality at the right basal ganglia, favored to reflect a small metastasis rather than infarct (series 5, image 75). No midline shift or hydrocephalus. No other extra-axial fluid collection. Pituitary gland suprasellar region within normal limits. Vascular: Major intracranial arterial vascular flow voids are maintained. Skull and upper cervical spine: Craniocervical junction within normal limits. Heterogeneous signal intensity seen within the visualized bone marrow without focal marrow  replacing lesion. No scalp soft tissue abnormality. Sinuses/Orbits: Globes within normal limits. Increased CSF signal intensity seen along the optic nerve sheaths bilaterally, suggesting elevated intracranial pressures. Orbital soft tissues otherwise unremarkable. Moderate mucosal thickening within the right maxillary sinus. Paranasal sinuses are otherwise largely clear. Small right with trace left mastoid effusions. Other: None. MRV HEAD FINDINGS: The above described dural based mass again noted at the posterior aspect of the superior sagittal sinus. The mass invades and obliterates the superior sagittal sinus at this level (series 1, image 98). Tubular filling defect is seen within the superior sagittal sinus anterior to the mass, likely a small amount of nonocclusive thrombus (series 13, images 88, 102). Superior sagittal sinus  otherwise patent anteriorly. Additional extension of the lesion to involve the posterior aspect of the suture superior sagittal sinus as it courses towards the torcula (series 12, image 82) the torcula itself is patent. Transverse and sigmoid sinuses are patent as are the visualized proximal internal jugular veins. Straight sinus, vein of Galen, inferior sagittal sinus, internal cerebral veins, and basal veins of Rosenthal are patent. No abnormality about the cavernous sinus. Superior orbital veins symmetric and within normal limits. No appreciable cortical venous thrombosis. IMPRESSION: MRI HEAD IMPRESSION: 1. 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 Mild localized edema with hemorrhagic blood products about several of these lesions without significant regional mass effect or midline shift. 2. 3.2 x 6.6 x 4.8 cm enhancing extra-axial mass at the posterior aspect of the superior sagittal sinus, indeterminate. While this finding could reflect an additional dural-based metastasis, a possible concomitant meningioma could also be considered. MRV HEAD IMPRESSION: 1. Invasion and obliteration of the posterior superior sagittal sinus by the above described dural based mass. Small amount of additional nonocclusive thrombus within the superior sagittal sinus anterior to the mass as above. 2. Remainder of the major dural sinuses are otherwise patent. Electronically Signed   By: Jeannine Boga M.D.   On: 11/28/2021 00:09   MR Venogram Head  Result Date: 11/28/2021 CLINICAL DATA:  Initial evaluation for brain mass. EXAM: MRI HEAD WITHOUT AND WITH CONTRAST MRV HEAD WITHOUT CONTRAST TECHNIQUE: Multiplanar, multiecho pulse sequences of the brain and surrounding structures were obtained without and with intravenous contrast. Angiographic images of the intracranial venous structures were obtained using MRV technique without intravenous contrast. CONTRAST:  29mL  GADAVIST GADOBUTROL 1 MMOL/ML IV SOLN COMPARISON:  Prior CT from 11/26/2021. FINDINGS: MRI HEAD FINDINGS: Brain: Cerebral volume within normal limits. Multiple scattered intraparenchymal lesions are seen involving both cerebral hemispheres as well as the cerebellum, consistent with widespread metastatic disease. There are at least 20 lesions. For reference purposes, the largest lesion within the right cerebral hemisphere is present at the right temporal lobe in measures 1.5 x 1.2 x 0.7 cm (series 26, image 22). The largest lesion within the left cerebral hemisphere is positioned at the left occipital pole and measures 1.5 x 1.7 x 1.7 cm (series 26, image 21). Largest distinct infratentorial lesion is seen within the central cerebellum and measures 2.1 x 2.2 x 2.5 cm (series 26, image 12). Several of these lesions are somewhat cystic in appearance. Few lesions demonstrate intrinsic T1 hyperintensity and/or SWI signal, consistent with hemorrhage and/or blood products, most notable at the central cerebellum (series 11, image 11). Mild localized edema about several of these lesions without significant regional mass effect. An additional large enhancing extra-axial mass positioned at the posterior aspect of the superior sagittal sinus measures 3.2 x 6.6  x 4.8 cm (series 28, image 13). Scattered areas of internal susceptibility artifact consistent with blood products. Mild localized vasogenic edema without significant mass effect. This lesion is somewhat indeterminate, and could reflect an additional dural-based metastasis. Concomitant meningioma could also be considered. This lesion invades and obliterates the adjacent superior sagittal sinus, better evaluated on concomitant MRV. Mild diffuse smooth pachymeningeal thickening and enhancement is likely secondary. No definite evidence for acute or subacute ischemia. Note made of a punctate focus of diffusion abnormality at the right basal ganglia, favored to reflect a small  metastasis rather than infarct (series 5, image 75). No midline shift or hydrocephalus. No other extra-axial fluid collection. Pituitary gland suprasellar region within normal limits. Vascular: Major intracranial arterial vascular flow voids are maintained. Skull and upper cervical spine: Craniocervical junction within normal limits. Heterogeneous signal intensity seen within the visualized bone marrow without focal marrow replacing lesion. No scalp soft tissue abnormality. Sinuses/Orbits: Globes within normal limits. Increased CSF signal intensity seen along the optic nerve sheaths bilaterally, suggesting elevated intracranial pressures. Orbital soft tissues otherwise unremarkable. Moderate mucosal thickening within the right maxillary sinus. Paranasal sinuses are otherwise largely clear. Small right with trace left mastoid effusions. Other: None. MRV HEAD FINDINGS: The above described dural based mass again noted at the posterior aspect of the superior sagittal sinus. The mass invades and obliterates the superior sagittal sinus at this level (series 1, image 98). Tubular filling defect is seen within the superior sagittal sinus anterior to the mass, likely a small amount of nonocclusive thrombus (series 13, images 88, 102). Superior sagittal sinus otherwise patent anteriorly. Additional extension of the lesion to involve the posterior aspect of the suture superior sagittal sinus as it courses towards the torcula (series 12, image 82) the torcula itself is patent. Transverse and sigmoid sinuses are patent as are the visualized proximal internal jugular veins. Straight sinus, vein of Galen, inferior sagittal sinus, internal cerebral veins, and basal veins of Rosenthal are patent. No abnormality about the cavernous sinus. Superior orbital veins symmetric and within normal limits. No appreciable cortical venous thrombosis. IMPRESSION: MRI HEAD IMPRESSION: 1. Widespread intracranial metastatic disease involving both  cerebral hemispheres and cerebellum as above. Mild localized edema with hemorrhagic blood products about several of these lesions without significant regional mass effect or midline shift. 2. 3.2 x 6.6 x 4.8 cm enhancing extra-axial mass at the posterior aspect of the superior sagittal sinus, indeterminate. While this finding could reflect an additional dural-based metastasis, a possible concomitant meningioma could also be considered. MRV HEAD IMPRESSION: 1. Invasion and obliteration of the posterior superior sagittal sinus by the above described dural based mass. Small amount of additional nonocclusive thrombus within the superior sagittal sinus anterior to the mass as above. 2. Remainder of the major dural sinuses are otherwise patent. Electronically Signed   By: Jeannine Boga M.D.   On: 11/28/2021 00:09   CT CHEST ABDOMEN PELVIS W CONTRAST  Result Date: 11/26/2021 CLINICAL DATA:  Persistent cough, shortness of breath EXAM: CT CHEST, ABDOMEN, AND PELVIS WITH CONTRAST TECHNIQUE: Multidetector CT imaging of the chest, abdomen and pelvis was performed following the standard protocol during bolus administration of intravenous contrast. CONTRAST:  123mL OMNIPAQUE IOHEXOL 300 MG/ML  SOLN COMPARISON:  Previous studies including chest radiographs done on 11/24/2021 FINDINGS: CT CHEST FINDINGS Cardiovascular: Coronary artery calcifications are seen. There is homogeneous enhancement in thoracic aorta. There are no intraluminal filling defects in central pulmonary artery branches. There is extrinsic compression of right main pulmonary artery caused  by pathologically enlarged lymph nodes in mediastinum and right hilum. There is extrinsic pressure over the superior vena cava caused by lymphadenopathy. Mediastinum/Nodes: There is bulky lymphadenopathy in the mediastinum extending to the right hilum measuring approximately 8.6 x 8.6 x 9.5 cm. There is 2.6 x 1.9 cm node in the left hilum. Lungs/Pleura: Centrilobular  emphysema is seen. In image 54 of series 4, there is 2.9 x 2.4 cm pleural-based noncalcified nodule in the right upper lobe. There are increased interstitial markings in the right upper lobe. In the image 71, there is 12 mm nodular density in the right upper lobe. In the image 73, there is 6 mm nodule in the right upper lobe. There are 3 small nodular densities each measuring less than 9 mm in size in the right lower lobe. Musculoskeletal: Unremarkable. CT ABDOMEN PELVIS FINDINGS Hepatobiliary: Liver measures 17.5 cm in length. In the image 58 of series 2, there is 1.6 cm fluid density lesion in the left lobe, possibly cyst. There are other scattered low-density foci in the right lobe some of which may be cysts. Possibility of space-occupying neoplastic process is not excluded. There are slightly enlarged lymph nodes anterior to the liver and adjacent to the pericardium largest measuring 2.7 x 1.7 cm. There is no dilation of bile ducts. Gallbladder is not distended. Pancreas: Head of the pancreas is prominent in size. There is no dilation of pancreatic duct. No focal abnormality is seen in the body and tail of pancreas. Spleen: Spleen is not enlarged. Adrenals/Urinary Tract: There is 3 cm nodule in the left adrenal. There is 2 cm nodule in the right adrenal. There is no hydronephrosis. There are no renal or ureteral stones. There are smooth marginated nodular densities adjacent to the kidneys largest measuring 3.6 cm posterior to the upper pole of right kidney. Density measurements in this lesions are higher than usual for simple cysts. Ureters not dilated. Urinary bladder is not distended. Stomach/Bowel: Stomach is unremarkable. Small bowel loops are not dilated. Appendix is not dilated. There is no significant wall thickening in colon. Vascular/Lymphatic: There are scattered atherosclerotic plaques and calcifications in the aorta and its major branches. There are pathologically enlarged lymph nodes in the  mesentery and retroperitoneum. Largest of these nodes is noted anterior to the transverse colon measuring 5.9 x 3 cm. There are multiple other smaller nodules of varying sizes in the abdomen in the mesentery and retroperitoneum. Reproductive: Prostate is not enlarged. There is fluid density in the the region of prostatic urethra. This may suggest previous prostate surgery. Other: In image 76 of series 2, there is a 12 mm subcutaneous nodule in the right anterior abdominal wall. Small bilateral inguinal hernias containing fat are seen. Musculoskeletal: Unremarkable. IMPRESSION: There is 2.9 cm lobulated noncalcified pleural-based nodule in the right upper lobe suggesting possible primary malignant neoplasm. There are pathologically enlarged bulky lymph nodes in the mediastinum and both hilar regions, more so on the right side suggesting metastatic lymphadenopathy. There is extrinsic pressure over the right main pulmonary artery and superior vena cava by the enlarged lymph nodes in mediastinum. Increased interstitial markings and small scattered nodules are seen in the right lung which may be part of pneumonia or pulmonary metastatic disease. There are nodular densities in both adrenals largest measuring 3 cm in the left adrenal suggesting possible metastatic disease. There are enlarged lymph nodes in the retroperitoneum and mesentery largest measuring 5.9 cm in maximum diameter suggesting metastatic lymphadenopathy. There are multiple cysts in the liver. There  are other indeterminate low-density lesions of varying sizes in the liver. Possibility of hepatic metastatic disease is not excluded. Head of the pancreas is enlarged in size with lobulated margins. Possibility of neoplastic process in the head of the pancreas is not excluded. Follow-up PET-CT and biopsy as warranted should be considered. Coronary artery calcifications are seen. Other findings as described in the body of the report. Electronically Signed   By:  Elmer Picker M.D.   On: 11/26/2021 13:49     Scheduled Meds:  chlorhexidine  15 mL Mouth Rinse BID   Chlorhexidine Gluconate Cloth  6 each Topical Daily   dexamethasone (DECADRON) injection  4 mg Intravenous Q8H   fentaNYL       insulin aspart  0-15 Units Subcutaneous TID WC   LORazepam  1 mg Intravenous Once   mouth rinse  15 mL Mouth Rinse q12n4p   midazolam       nicotine  7 mg Transdermal Daily   pantoprazole  40 mg Oral Daily   Continuous Infusions:   LOS: 2 days    Time spent: 35 mins    Karsynn Deweese, MD [facility]   If 7PM-7AM, please contact night-coverage

## 2021-11-28 NOTE — Clinical Social Work Note (Signed)
  Transition of Care [facility]) Screening Note   Patient Details  Name: Ryan [name] Date of Birth: [birth year]   Transition of Care [facility]) CM/SW Contact:    Trish Mage, LCSW Phone Number: 11/28/2021, 1:54 PM    Transition of Care Department [facility]) has reviewed patient and no TOC needs have been identified at this time. We will continue to monitor patient advancement through interdisciplinary progression rounds. If new patient transition needs arise, please place a TOC consult.

## 2021-11-28 NOTE — Progress Notes (Addendum)
HEMATOLOGY-ONCOLOGY PROGRESS NOTE  I seen him and agree with documentation as follows  ASSESSMENT & PLAN Lung mass, brain mass, adrenal lesions concerning for metastatic malignancy, likely lung cancer until proven otherwise Imaging findings discussed with the patient and his significant other We discussed findings are concerning for metastatic/stage IV lung cancer We discussed need for biopsy to confirm the diagnosis and to determine the specific type of lung cancer We had initial discussions regarding treatment which would consist of radiation to the brain and possibly the chest due to impending SVC syndrome and chemotherapy to treat systemic disease, however, awaiting biopsy to guide further discussion regarding treatment Underwent biopsy earlier today Port-A-Cath placement planned for for this afternoon  Continue dexamethasone Radiation oncology has already consulted and planning for simulation later today He can benefit from outpatient PET/CT imaging I will check on him next week I reviewed plan of care with his significant other significantly today   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 Sodium slowly improving Monitor I explained to the patient the rationale of not discharging him until his sodium is consistently stable over 130 due to high risk of arrhythmia and seizures   History of prostate cancer Status post prostatectomy with normal PSA in October 2022 He is followed as an outpatient by his primary care provider/urology   Diabetes mellitus On sliding scale insulin Management per hospitalist   Goals of care discussion Discussed with the patient and his significant other that he likely has stage IV lung cancer which is not curable Goal of care is to extend life   Discharge plan Patient has asked to go home multiple times but advised patient that he should stay in the hospital to expedite work-up I will return to check on him next  week Patient was told that he needed to stay in the hospital until sodium improved, biopsy obtained, Port-A-Cath placed, and for him to be seen by radiation oncology  [name], MD  SUBJECTIVE: Sitting up in the recliner without any complaints this morning Underwent CT-guided biopsy of one of his lymph nodes earlier today and tolerated procedure well Currently denies headaches, dizziness, seizure-like activity  REVIEW OF SYSTEMS:   Constitutional: Denies fevers, chills  Eyes: Denies blurriness of vision Ears, nose, mouth, throat, and face: Denies mucositis or sore throat Respiratory: Positive for cough Cardiovascular: Denies palpitation, chest discomfort Gastrointestinal:  Denies nausea, heartburn or change in bowel habits Skin: Denies abnormal skin rashes Lymphatics: Denies new lymphadenopathy or easy bruising Neurological:Denies numbness, tingling or new weaknesses Behavioral/Psych: Mood is stable, no new changes  Extremities: No lower extremity edema All other systems were reviewed with the patient and are negative.  I have reviewed the past medical history, past surgical history, social history and family history with the patient and they are unchanged from previous note.   PHYSICAL EXAMINATION: ECOG PERFORMANCE STATUS: 2 - Symptomatic, <50% confined to bed  Vitals:   11/28/21 0600 11/28/21 0700  BP: (!) 152/106 (!) 159/80  Pulse: 94 85  Resp: 13 15  Temp:    SpO2: 100% 98%   Filed Weights   11/26/21 0924  Weight: 106.6 kg    Intake/Output from previous day: No intake/output data recorded.  GENERAL:alert, no distress and comfortable SKIN: skin color, texture, turgor are normal, no rashes or significant lesions EYES: normal, Conjunctiva are pink and non-injected, sclera clear OROPHARYNX:no exudate, no erythema and lips, buccal mucosa, and tongue normal  LUNGS: clear to auscultation and percussion with  normal breathing effort HEART: regular rate & rhythm and no  murmurs and no lower extremity edema ABDOMEN:abdomen soft, non-tender and normal bowel sounds  NEURO: alert & oriented x 3 with fluent speech, no focal motor/sensory deficits  LABORATORY DATA:  I have reviewed the data as listed CMP Latest Ref Rng & Units 11/28/2021 11/27/2021 11/27/2021  Glucose 70 - 99 mg/dL 126(H) 151(H) 187(H)  BUN 8 - 23 mg/dL 17 16 14   Creatinine 0.61 - 1.24 mg/dL 0.53(L) 0.61 0.56(L)  Sodium 135 - 145 mmol/L 125(L) 124(L) 123(L)  Potassium 3.5 - 5.1 mmol/L 4.1 4.7 4.0  Chloride 98 - 111 mmol/L 92(L) 90(L) 89(L)  CO2 22 - 32 mmol/L 24 25 24   Calcium 8.9 - 10.3 mg/dL 9.3 9.4 9.3  Total Protein 6.5 - 8.1 g/dL - - -  Total Bilirubin 0.3 - 1.2 mg/dL - - -  Alkaline Phos 38 - 126 U/L - - -  AST 15 - 41 U/L - - -  ALT 0 - 44 U/L - - -    Lab Results  Component Value Date   WBC 8.7 11/26/2021   HGB 12.0 (L) 11/26/2021   HCT 34.3 (L) 11/26/2021   MCV 90.5 11/26/2021   PLT 247 11/26/2021   NEUTROABS 7.2 11/26/2021    CT Head Wo Contrast  Result Date: 11/26/2021 CLINICAL DATA:  Dizziness and disequilibrium. Symptoms for 1 month. COVID 3 weeks ago. Prostate cancer with new lung mass. EXAM: CT HEAD WITHOUT CONTRAST TECHNIQUE: Contiguous axial images were obtained from the base of the skull through the vertex without intravenous contrast. COMPARISON:  None. FINDINGS: Brain: A midline extra-axial hyperdense mass lesion surrounds the superior sagittal sinus. The lesion measures 5.9 x 3.5 x 4.4 cm. An area of hypoattenuation is present in the posterior right frontal lobe adjacent to the lesion. There is mass effect on the posterior frontal and parietal lobes bilaterally. Separate hyperdense focus is present in the cortical or subcortical left parietal lobe on axial image 24 of series 5. Similar subcortical hyperdensity is present in the left occipital lobe on axial image 17 of series 5. Areas of hypoattenuation are present in the cerebellum. The largest is in midline with some  surrounding hyperdensity, also concerning for a metastatic lesion. It measures 2.2 x 2.1 cm on saddle sagittal image 29 of series 4. Smaller hypodense left cystic lesions the cerebellum are also concerning metastases. Infarct is considered less likely. A hypodense lesion in the right temporal lobe measures 11 mm. The ventricles are of normal size.  No significant fluid is present Vascular: No hyperdense vessel or unexpected calcification. Skull: Calvarium is intact. No focal lytic or blastic lesions are present. No significant extracranial soft tissue lesion is present. Sinuses/Orbits: Right maxillary sinus opacification is likely chronic. There is some wall thickening. Minimal air is present in the sinus. The paranasal sinuses and mastoid air cells are otherwise clear. The globes and orbits are within normal limits. IMPRESSION: 1. 5.9 x 3.5 x 4.4 cm midline extra-axial hyperdense mass lesion surrounds the superior sagittal sinus. This most likely represents a metastasis. Meningioma is considered less likely. 2. Other hyperdense lesions are present in the cortical or subcortical left parietal lobe, left occipital lobe, and right temporal lobe concerning for metastatic disease. 3. There is mass effect on the posterior frontal and parietal lobes bilaterally. 4. Recommend MRI the brain without and contrast for further evaluation of these lesions. 5. Chronic right maxillary sinus disease. These results were called by telephone at the  time of interpretation on 11/26/2021 at 1:30 pm to provider Dr. Melina Copa, who verbally acknowledged these results. Electronically Signed   By: San Morelle M.D.   On: 11/26/2021 13:46   MR Brain W and Wo Contrast  Result Date: 11/28/2021 CLINICAL DATA:  Initial evaluation for brain mass. EXAM: MRI HEAD WITHOUT AND WITH CONTRAST MRV HEAD WITHOUT CONTRAST TECHNIQUE: Multiplanar, multiecho pulse sequences of the brain and surrounding structures were obtained without and with  intravenous contrast. Angiographic images of the intracranial venous structures were obtained using MRV technique without intravenous contrast. CONTRAST:  60mL GADAVIST GADOBUTROL 1 MMOL/ML IV SOLN COMPARISON:  Prior CT from 11/26/2021. FINDINGS: MRI HEAD FINDINGS: Brain: Cerebral volume within normal limits. Multiple scattered intraparenchymal lesions are seen involving both cerebral hemispheres as well as the cerebellum, consistent with widespread metastatic disease. There are at least 20 lesions. For reference purposes, the largest lesion within the right cerebral hemisphere is present at the right temporal lobe in measures 1.5 x 1.2 x 0.7 cm (series 26, image 22). The largest lesion within the left cerebral hemisphere is positioned at the left occipital pole and measures 1.5 x 1.7 x 1.7 cm (series 26, image 21). Largest distinct infratentorial lesion is seen within the central cerebellum and measures 2.1 x 2.2 x 2.5 cm (series 26, image 12). Several of these lesions are somewhat cystic in appearance. Few lesions demonstrate intrinsic T1 hyperintensity and/or SWI signal, consistent with hemorrhage and/or blood products, most notable at the central cerebellum (series 11, image 11). Mild localized edema about several of these lesions without significant regional mass effect. An additional large enhancing extra-axial mass positioned at the posterior aspect of the superior sagittal sinus measures 3.2 x 6.6 x 4.8 cm (series 28, image 13). Scattered areas of internal susceptibility artifact consistent with blood products. Mild localized vasogenic edema without significant mass effect. This lesion is somewhat indeterminate, and could reflect an additional dural-based metastasis. Concomitant meningioma could also be considered. This lesion invades and obliterates the adjacent superior sagittal sinus, better evaluated on concomitant MRV. Mild diffuse smooth pachymeningeal thickening and enhancement is likely secondary. No  definite evidence for acute or subacute ischemia. Note made of a punctate focus of diffusion abnormality at the right basal ganglia, favored to reflect a small metastasis rather than infarct (series 5, image 75). No midline shift or hydrocephalus. No other extra-axial fluid collection. Pituitary gland suprasellar region within normal limits. Vascular: Major intracranial arterial vascular flow voids are maintained. Skull and upper cervical spine: Craniocervical junction within normal limits. Heterogeneous signal intensity seen within the visualized bone marrow without focal marrow replacing lesion. No scalp soft tissue abnormality. Sinuses/Orbits: Globes within normal limits. Increased CSF signal intensity seen along the optic nerve sheaths bilaterally, suggesting elevated intracranial pressures. Orbital soft tissues otherwise unremarkable. Moderate mucosal thickening within the right maxillary sinus. Paranasal sinuses are otherwise largely clear. Small right with trace left mastoid effusions. Other: None. MRV HEAD FINDINGS: The above described dural based mass again noted at the posterior aspect of the superior sagittal sinus. The mass invades and obliterates the superior sagittal sinus at this level (series 1, image 98). Tubular filling defect is seen within the superior sagittal sinus anterior to the mass, likely a small amount of nonocclusive thrombus (series 13, images 88, 102). Superior sagittal sinus otherwise patent anteriorly. Additional extension of the lesion to involve the posterior aspect of the suture superior sagittal sinus as it courses towards the torcula (series 12, image 82) the torcula itself is patent. Transverse  and sigmoid sinuses are patent as are the visualized proximal internal jugular veins. Straight sinus, vein of Galen, inferior sagittal sinus, internal cerebral veins, and basal veins of Rosenthal are patent. No abnormality about the cavernous sinus. Superior orbital veins symmetric and  within normal limits. No appreciable cortical venous thrombosis. IMPRESSION: MRI HEAD IMPRESSION: 1. 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 Mild localized edema with hemorrhagic blood products about several of these lesions without significant regional mass effect or midline shift. 2. 3.2 x 6.6 x 4.8 cm enhancing extra-axial mass at the posterior aspect of the superior sagittal sinus, indeterminate. While this finding could reflect an additional dural-based metastasis, a possible concomitant meningioma could also be considered. MRV HEAD IMPRESSION: 1. Invasion and obliteration of the posterior superior sagittal sinus by the above described dural based mass. Small amount of additional nonocclusive thrombus within the superior sagittal sinus anterior to the mass as above. 2. Remainder of the major dural sinuses are otherwise patent. Electronically Signed   By: Jeannine Boga M.D.   On: 11/28/2021 00:09   MR Venogram Head  Result Date: 11/28/2021 CLINICAL DATA:  Initial evaluation for brain mass. EXAM: MRI HEAD WITHOUT AND WITH CONTRAST MRV HEAD WITHOUT CONTRAST TECHNIQUE: Multiplanar, multiecho pulse sequences of the brain and surrounding structures were obtained without and with intravenous contrast. Angiographic images of the intracranial venous structures were obtained using MRV technique without intravenous contrast. CONTRAST:  27mL GADAVIST GADOBUTROL 1 MMOL/ML IV SOLN COMPARISON:  Prior CT from 11/26/2021. FINDINGS: MRI HEAD FINDINGS: Brain: Cerebral volume within normal limits. Multiple scattered intraparenchymal lesions are seen involving both cerebral hemispheres as well as the cerebellum, consistent with widespread metastatic disease. There are at least 20 lesions. For reference purposes, the largest lesion within the right cerebral hemisphere is present at the right temporal lobe in measures 1.5 x 1.2 x 0.7 cm (series 26, image 22).  The largest lesion within the left cerebral hemisphere is positioned at the left occipital pole and measures 1.5 x 1.7 x 1.7 cm (series 26, image 21). Largest distinct infratentorial lesion is seen within the central cerebellum and measures 2.1 x 2.2 x 2.5 cm (series 26, image 12). Several of these lesions are somewhat cystic in appearance. Few lesions demonstrate intrinsic T1 hyperintensity and/or SWI signal, consistent with hemorrhage and/or blood products, most notable at the central cerebellum (series 11, image 11). Mild localized edema about several of these lesions without significant regional mass effect. An additional large enhancing extra-axial mass positioned at the posterior aspect of the superior sagittal sinus measures 3.2 x 6.6 x 4.8 cm (series 28, image 13). Scattered areas of internal susceptibility artifact consistent with blood products. Mild localized vasogenic edema without significant mass effect. This lesion is somewhat indeterminate, and could reflect an additional dural-based metastasis. Concomitant meningioma could also be considered. This lesion invades and obliterates the adjacent superior sagittal sinus, better evaluated on concomitant MRV. Mild diffuse smooth pachymeningeal thickening and enhancement is likely secondary. No definite evidence for acute or subacute ischemia. Note made of a punctate focus of diffusion abnormality at the right basal ganglia, favored to reflect a small metastasis rather than infarct (series 5, image 75). No midline shift or hydrocephalus. No other extra-axial fluid collection. Pituitary gland suprasellar region within normal limits. Vascular: Major intracranial arterial vascular flow voids are maintained. Skull and upper cervical spine: Craniocervical junction within normal limits. Heterogeneous signal intensity seen within the visualized bone marrow without focal marrow replacing lesion. No scalp  soft tissue abnormality. Sinuses/Orbits: Globes within normal  limits. Increased CSF signal intensity seen along the optic nerve sheaths bilaterally, suggesting elevated intracranial pressures. Orbital soft tissues otherwise unremarkable. Moderate mucosal thickening within the right maxillary sinus. Paranasal sinuses are otherwise largely clear. Small right with trace left mastoid effusions. Other: None. MRV HEAD FINDINGS: The above described dural based mass again noted at the posterior aspect of the superior sagittal sinus. The mass invades and obliterates the superior sagittal sinus at this level (series 1, image 98). Tubular filling defect is seen within the superior sagittal sinus anterior to the mass, likely a small amount of nonocclusive thrombus (series 13, images 88, 102). Superior sagittal sinus otherwise patent anteriorly. Additional extension of the lesion to involve the posterior aspect of the suture superior sagittal sinus as it courses towards the torcula (series 12, image 82) the torcula itself is patent. Transverse and sigmoid sinuses are patent as are the visualized proximal internal jugular veins. Straight sinus, vein of Galen, inferior sagittal sinus, internal cerebral veins, and basal veins of Rosenthal are patent. No abnormality about the cavernous sinus. Superior orbital veins symmetric and within normal limits. No appreciable cortical venous thrombosis. IMPRESSION: MRI HEAD IMPRESSION: 1. 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 Mild localized edema with hemorrhagic blood products about several of these lesions without significant regional mass effect or midline shift. 2. 3.2 x 6.6 x 4.8 cm enhancing extra-axial mass at the posterior aspect of the superior sagittal sinus, indeterminate. While this finding could reflect an additional dural-based metastasis, a possible concomitant meningioma could also be considered. MRV HEAD IMPRESSION: 1. Invasion and obliteration of the posterior superior  sagittal sinus by the above described dural based mass. Small amount of additional nonocclusive thrombus within the superior sagittal sinus anterior to the mass as above. 2. Remainder of the major dural sinuses are otherwise patent. Electronically Signed   By: Jeannine Boga M.D.   On: 11/28/2021 00:09   CT CHEST ABDOMEN PELVIS W CONTRAST  Result Date: 11/26/2021 CLINICAL DATA:  Persistent cough, shortness of breath EXAM: CT CHEST, ABDOMEN, AND PELVIS WITH CONTRAST TECHNIQUE: Multidetector CT imaging of the chest, abdomen and pelvis was performed following the standard protocol during bolus administration of intravenous contrast. CONTRAST:  17mL OMNIPAQUE IOHEXOL 300 MG/ML  SOLN COMPARISON:  Previous studies including chest radiographs done on 11/24/2021 FINDINGS: CT CHEST FINDINGS Cardiovascular: Coronary artery calcifications are seen. There is homogeneous enhancement in thoracic aorta. There are no intraluminal filling defects in central pulmonary artery branches. There is extrinsic compression of right main pulmonary artery caused by pathologically enlarged lymph nodes in mediastinum and right hilum. There is extrinsic pressure over the superior vena cava caused by lymphadenopathy. Mediastinum/Nodes: There is bulky lymphadenopathy in the mediastinum extending to the right hilum measuring approximately 8.6 x 8.6 x 9.5 cm. There is 2.6 x 1.9 cm node in the left hilum. Lungs/Pleura: Centrilobular emphysema is seen. In image 54 of series 4, there is 2.9 x 2.4 cm pleural-based noncalcified nodule in the right upper lobe. There are increased interstitial markings in the right upper lobe. In the image 71, there is 12 mm nodular density in the right upper lobe. In the image 73, there is 6 mm nodule in the right upper lobe. There are 3 small nodular densities each measuring less than 9 mm in size in the right lower lobe. Musculoskeletal: Unremarkable. CT ABDOMEN PELVIS FINDINGS Hepatobiliary: Liver measures  17.5 cm in length. In the image 58  of series 2, there is 1.6 cm fluid density lesion in the left lobe, possibly cyst. There are other scattered low-density foci in the right lobe some of which may be cysts. Possibility of space-occupying neoplastic process is not excluded. There are slightly enlarged lymph nodes anterior to the liver and adjacent to the pericardium largest measuring 2.7 x 1.7 cm. There is no dilation of bile ducts. Gallbladder is not distended. Pancreas: Head of the pancreas is prominent in size. There is no dilation of pancreatic duct. No focal abnormality is seen in the body and tail of pancreas. Spleen: Spleen is not enlarged. Adrenals/Urinary Tract: There is 3 cm nodule in the left adrenal. There is 2 cm nodule in the right adrenal. There is no hydronephrosis. There are no renal or ureteral stones. There are smooth marginated nodular densities adjacent to the kidneys largest measuring 3.6 cm posterior to the upper pole of right kidney. Density measurements in this lesions are higher than usual for simple cysts. Ureters not dilated. Urinary bladder is not distended. Stomach/Bowel: Stomach is unremarkable. Small bowel loops are not dilated. Appendix is not dilated. There is no significant wall thickening in colon. Vascular/Lymphatic: There are scattered atherosclerotic plaques and calcifications in the aorta and its major branches. There are pathologically enlarged lymph nodes in the mesentery and retroperitoneum. Largest of these nodes is noted anterior to the transverse colon measuring 5.9 x 3 cm. There are multiple other smaller nodules of varying sizes in the abdomen in the mesentery and retroperitoneum. Reproductive: Prostate is not enlarged. There is fluid density in the the region of prostatic urethra. This may suggest previous prostate surgery. Other: In image 76 of series 2, there is a 12 mm subcutaneous nodule in the right anterior abdominal wall. Small bilateral inguinal hernias  containing fat are seen. Musculoskeletal: Unremarkable. IMPRESSION: There is 2.9 cm lobulated noncalcified pleural-based nodule in the right upper lobe suggesting possible primary malignant neoplasm. There are pathologically enlarged bulky lymph nodes in the mediastinum and both hilar regions, more so on the right side suggesting metastatic lymphadenopathy. There is extrinsic pressure over the right main pulmonary artery and superior vena cava by the enlarged lymph nodes in mediastinum. Increased interstitial markings and small scattered nodules are seen in the right lung which may be part of pneumonia or pulmonary metastatic disease. There are nodular densities in both adrenals largest measuring 3 cm in the left adrenal suggesting possible metastatic disease. There are enlarged lymph nodes in the retroperitoneum and mesentery largest measuring 5.9 cm in maximum diameter suggesting metastatic lymphadenopathy. There are multiple cysts in the liver. There are other indeterminate low-density lesions of varying sizes in the liver. Possibility of hepatic metastatic disease is not excluded. Head of the pancreas is enlarged in size with lobulated margins. Possibility of neoplastic process in the head of the pancreas is not excluded. Follow-up PET-CT and biopsy as warranted should be considered. Coronary artery calcifications are seen. Other findings as described in the body of the report. Electronically Signed   By: Elmer Picker M.D.   On: 11/26/2021 13:49     Future Appointments  Date Time Provider [place]  11/28/2021  9:00 AM Tyler Pita, MD [facility] None  12/01/2021  7:00 AM [facility] None      LOS: 2 days

## 2021-11-28 NOTE — Procedures (Signed)
Interventional Radiology Procedure:   Indications: Metastatic disease based imaging and needs tissue diagnosis  Procedure: CT guided biopsy of abdominal peritoneal mass  Findings: Core specimens obtained from upper abdominal mass  Complications: No immediate complications noted.     EBL: Minimal  Plan: Bedrest 2 hours   Nayeli Calvert R. Anselm Pancoast, MD  Pager: [phone]

## 2021-11-28 NOTE — Plan of Care (Signed)

## 2021-11-28 NOTE — Procedures (Signed)
Interventional Radiology Procedure:   Indications: Metastatic disease  Procedure: Port placement  Findings: Right jugular port, tip at SVC/RA junction  Complications: None     EBL: Minimal, less than 10 ml  Plan: Keep port site and incisions dry for at least 24 hours.     Ryan Borden R. Anselm Pancoast, MD  Pager: [phone]

## 2021-11-29 DIAGNOSIS — E871 Hypo-osmolality and hyponatremia: Secondary | ICD-10-CM | POA: Diagnosis not present

## 2021-11-29 LAB — GLUCOSE, CAPILLARY
Glucose-Capillary: 254 mg/dL — ABNORMAL HIGH (ref 70–99)
Glucose-Capillary: 227 mg/dL — ABNORMAL HIGH (ref 70–99)
Glucose-Capillary: 203 mg/dL — ABNORMAL HIGH (ref 70–99)
Glucose-Capillary: 218 mg/dL — ABNORMAL HIGH (ref 70–99)

## 2021-11-29 LAB — CBC
HCT: 37 % — ABNORMAL LOW (ref 39.0–52.0)
Hemoglobin: 12.2 g/dL — ABNORMAL LOW (ref 13.0–17.0)
MCH: 31.4 pg (ref 26.0–34.0)
MCHC: 33 g/dL (ref 30.0–36.0)
MCV: 95.1 fL (ref 80.0–100.0)
Platelets: 231 10*3/uL (ref 150–400)
RBC: 3.89 MIL/uL — ABNORMAL LOW (ref 4.22–5.81)
RDW: 13.3 % (ref 11.5–15.5)
WBC: 6.7 10*3/uL (ref 4.0–10.5)
nRBC: 0 % (ref 0.0–0.2)

## 2021-11-29 LAB — BASIC METABOLIC PANEL
Anion gap: 12 (ref 5–15)
BUN: 19 mg/dL (ref 8–23)
CO2: 21 mmol/L — ABNORMAL LOW (ref 22–32)
Calcium: 9 mg/dL (ref 8.9–10.3)
Chloride: 91 mmol/L — ABNORMAL LOW (ref 98–111)
Creatinine, Ser: 0.53 mg/dL — ABNORMAL LOW (ref 0.61–1.24)
GFR, Estimated: >60 mL/min (ref >60)
Glucose, Bld: 140 mg/dL — ABNORMAL HIGH (ref 70–99)
Potassium: 4.2 mmol/L (ref 3.5–5.1)
Sodium: 124 mmol/L — ABNORMAL LOW (ref 135–145)

## 2021-11-29 LAB — MAGNESIUM: Magnesium: 2 mg/dL (ref 1.7–2.4)

## 2021-11-29 LAB — PHOSPHORUS: Phosphorus: 3.2 mg/dL (ref 2.5–4.6)

## 2021-11-29 MED ORDER — OXYCODONE-ACETAMINOPHEN 5-325 MG PO TABS
1.0000 | ORAL_TABLET | Freq: Four times a day (QID) | ORAL | Status: DC | PRN
  Administered 2021-11-29 – 2021-11-30 (×2): 1 via ORAL
  Filled 2021-11-29 (×2): qty 1

## 2021-11-29 MED ORDER — SODIUM CHLORIDE 0.9% FLUSH
10.0000 mL | Freq: Two times a day (BID) | INTRAVENOUS | Status: DC
  Administered 2021-11-29 – 2021-12-04 (×12): 10 mL

## 2021-11-29 MED ORDER — DIPHENHYDRAMINE HCL 50 MG/ML IJ SOLN: 25.0000 mg | Freq: Once | INTRAMUSCULAR | Status: AC

## 2021-11-29 MED ORDER — GUAIFENESIN-DM 100-10 MG/5ML PO SYRP
5.0000 mL | ORAL_SOLUTION | Freq: Four times a day (QID) | ORAL | Status: AC | PRN
  Administered 2021-11-29 – 2021-11-30 (×2): 5 mL via ORAL
  Filled 2021-11-29 (×2): qty 10

## 2021-11-29 MED ORDER — SODIUM CHLORIDE 0.9% FLUSH
10.0000 mL | INTRAVENOUS | Status: DC | PRN
  Administered 2021-12-03: 15:00:00 10 mL

## 2021-11-29 MED ORDER — DIPHENHYDRAMINE HCL 50 MG/ML IJ SOLN
INTRAMUSCULAR | Status: AC
  Administered 2021-11-29: 25 mg via INTRAVENOUS
  Filled 2021-11-29: qty 1

## 2021-11-29 MED ORDER — ZOLPIDEM TARTRATE 5 MG PO TABS
5.0000 mg | ORAL_TABLET | Freq: Once | ORAL | Status: AC
  Administered 2021-11-29: 5 mg via ORAL
  Filled 2021-11-29: qty 1

## 2021-11-29 MED ORDER — ALPRAZOLAM 0.25 MG PO TABS
0.2500 mg | ORAL_TABLET | Freq: Once | ORAL | Status: AC
  Administered 2021-11-29: 0.25 mg via ORAL
  Filled 2021-11-29: qty 1

## 2021-11-29 NOTE — Progress Notes (Addendum)
Called to pt room - pt c/o itching and swelling of hands.  On further assessment, pt with bottom lip swelling.  Pt had also be c/o of cough at change of shift, of which he got Robitussin without effect.  Jeannette Corpus NP notified - diphenhydramine ordered x1 dose - given with + effect.  Pt also c/o some difficulty breathing through mouth, better through nose - given 2L [place] to support - pt states improvement.  Swelling to mouth, hands noted improved x20 minutes after administration  **new medications administered prior to reaction - hydralazine; percocet (per pt he has had percocet before)**

## 2021-11-29 NOTE — Progress Notes (Signed)
PROGRESS NOTE    Ryan [name]  IDP:[id number] DOB: 04/11/1956 DOA: 11/26/2021  PCP: [name], DO   Brief Narrative:  This 65 years old male with PMH significant for prostate cancer, type 2 diabetes presented in the ED with 3 weeks history of progressive weakness, decreased appetite, 20 pound weight loss.  Patient also been experiencing episodes of severe dizziness and disequilibrium. He reports he was diagnosed with COVID 3 weeks ago.  Work-up in the ED shows sodium 118.  CT chest showed 2.9 cm lobulated noncalcified pleural-based nodule in the right upper lobe suggesting possible primary malignant neoplasm.  There is multiple lesions noted in the brain, adrenal gland and nearby lymph nodes suggesting metastasis. 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  Sodium is slowly improving with fluid restriction.  Oncology is consulted recommended dexamethasone, MRI brain confirms mets.  Patient underwent CT-guided biopsy of abdominal peritoneal mass.  Patient also need Port-A-Cath placement for chemotherapy.  Assessment & Plan:   Principal Problem:   Acute hyponatremia Active Problems:   Metastatic cancer to brain ([name])   Lung cancer ([name])   DM2 (diabetes mellitus, type 2) ([facility])  Hyponatremia: Could be due to SIADH in the setting of apparent new metastatic lung CA. Sodium slowly improving with fluid restriction. Continue to monitor 118> 117> 120> 121> 123> 124> 125>124. Patient gets frustrated and wants to be discharged.  We explained the rationale for not discharging until sodium gets within 130 range.  Explained to the patient that he is at risk for seizures or life-threatening arrhythmias.  He understood.  Metastatic lung CA with mets in the brain, adrenal gland: Lung nodule and multiple lesions in brain, adrenal gland and nearby lymph node concerning for metastasis with primary lung carcinoma until proven  otherwise. IR consulted for biopsy of peritoneal lesion. Oncology is consulted,  recommended to continue dexamethasone. Oncology also recommended radiation oncology consult for radiation to the brain and possibly chest due to impending SVC syndrome. MRI brain confirms the metastatic lesions in the brain.Invasion and obliteration of the posterior superior sagittal sinus by the above described dural based mass. Patient underwent successful CT-guided biopsy of the abdominal mass, he also underwent Port-A-Cath placement by IR on 12/9. Patient is going to start radiation treatment today. Patient will need outpatient PET/CT imaging.  History of prostate cancer: S/p prostatectomy with normal PSA.  Diabetes mellitus: Continue regular insulin sliding scale.  Goals of care discussion.: Patient does have stage IV lung cancer with metastasis. Patient needs goals of care discussion about prognosis.  DVT prophylaxis:  SCDs Code Status: Full code. Family Communication: Wife in the room. Disposition Plan:  Status is: Inpatient  Remains inpatient appropriate because:  Hyponatremia, metastatic lung cancer requiring complete work-up,  underwent biopsy of the peritoneal mass. Port-A-Cath placement, Patient is going to start radiation therapy to the brain and chest.   Consultants:  Oncology, radiation oncology  Procedures: CT guided biopsy of the peritoneal mass.  Port a cath placement  Antimicrobials:   Anti-infectives (From admission, onward)    None       Subjective: Patient was seen and examined at bedside.  Overnight events noted.   Patient seems frustrated, wants to be discharged.   Explained to him in detail about the rationale for not discharge given low sodium, having high risk for seizures and arrhythmias.  He understood.  Patient underwent CT-guided biopsy of abdominal mass, Port-A-Cath placement yesterday by  IR  Objective: Vitals:   11/29/21 0000 11/29/21 0400 11/29/21 0700  11/29/21 0800  BP: (!) 147/77 (!) 188/91  (!) 195/112  Pulse: 77 82  87  Resp: 11 17  16   Temp:  (!) 97.5 F (36.4 C) 98.2 F (36.8 C)   TempSrc:  Oral Axillary   SpO2: 98% 99%  93%  Weight:      Height:        Intake/Output Summary (Last 24 hours) at 11/29/2021 1026 Last data filed at 11/29/2021 0800 Gross per 24 hour  Intake 240 ml  Output --  Net 240 ml   Filed Weights   11/26/21 0924  Weight: 106.6 kg    Examination:  General exam: Appears anxious, fidgety, wants to be discharged.  Deconditioned. Respiratory system: Clear to auscultation bilaterally. Respiratory effort normal. Cardiovascular system: S1-S2 heard, regular rate and rhythm, no murmur. Gastrointestinal system: Abdomen is soft, nontender, nondistended, BS +. Central nervous system: Alert and oriented x 3. No focal neurological deficits. Extremities: No edema, no cyanosis, no clubbing. Skin: No rashes, lesions or ulcers Psychiatry: Judgement and insight appear normal. Mood & affect appropriate.     Data Reviewed: I have personally reviewed following labs and imaging studies  CBC: Recent Labs  Lab 11/26/21 1130 11/29/21 0325  WBC 8.7 6.7  NEUTROABS 7.2  --   HGB 12.0* 12.2*  HCT 34.3* 37.0*  MCV 90.5 95.1  PLT 247 546   Basic Metabolic Panel: Recent Labs  Lab 11/26/21 1130 11/26/21 1329 11/27/21 0657 11/27/21 1302 11/27/21 1830 11/28/21 0023 11/29/21 0325  NA 118*   < > 121* 123* 124* 125* 124*  K 4.2   < > 4.3 4.0 4.7 4.1 4.2  CL 86*   < > 89* 89* 90* 92* 91*  CO2 23   < > 23 24 25 24  21*  GLUCOSE 119*   < > 180* 187* 151* 126* 140*  BUN 12   < > 12 14 16 17 19   CREATININE 0.43*   < > 0.45* 0.56* 0.61 0.53* 0.53*  CALCIUM 8.8*   < > 8.9 9.3 9.4 9.3 9.0  MG 1.6*  --   --   --   --   --  2.0  PHOS  --   --   --   --   --   --  3.2   < > = values in this interval not displayed.   GFR: Estimated Creatinine Clearance: 116.6 mL/min (A) (by C-G formula based on SCr of 0.53 mg/dL  (L)). Liver Function Tests: Recent Labs  Lab 11/26/21 1130  AST 27  ALT 15  ALKPHOS 69  BILITOT 0.6  PROT 7.1  ALBUMIN 3.7   Recent Labs  Lab 11/26/21 1130  LIPASE 114*   No results for input(s): AMMONIA in the last 168 hours. Coagulation Profile: Recent Labs  Lab 11/28/21 0023  INR 0.9   Cardiac Enzymes: No results for input(s): CKTOTAL, CKMB, CKMBINDEX, TROPONINI in the last 168 hours. BNP (last 3 results) No results for input(s): PROBNP in the last 8760 hours. HbA1C: Recent Labs    11/27/21 0117  HGBA1C 5.9*   CBG: Recent Labs  Lab 11/28/21 0731 11/28/21 1137 11/28/21 1815 11/28/21 2102 11/29/21 0748  GLUCAP 129* 154* 145* 232* 254*   Lipid Profile: No results for input(s): CHOL, HDL, LDLCALC, TRIG, CHOLHDL, LDLDIRECT in the last 72 hours. Thyroid Function Tests: No results for input(s): TSH, T4TOTAL, FREET4, T3FREE, THYROIDAB in the last 72 hours.  Anemia Panel: No results for input(s): VITAMINB12, FOLATE, FERRITIN, TIBC, IRON, RETICCTPCT in the last 72 hours. Sepsis Labs: No results for input(s): PROCALCITON, LATICACIDVEN in the last 168 hours.  Recent Results (from the past 240 hour(s))  Resp Panel by RT-PCR (Flu A&B, Covid) Nasopharyngeal Swab     Status: Abnormal   Collection Time: 11/26/21  4:04 PM   Specimen: Nasopharyngeal Swab; Nasopharyngeal(NP) swabs in vial transport medium  Result Value Ref Range Status   SARS Coronavirus 2 by RT PCR POSITIVE (A) NEGATIVE Final    Comment: RESULT CALLED TO, READ BACK BY AND VERIFIED WITH: JAMIE BAILEY RN @1646  11/26/2021 OLSONM (NOTE) SARS-CoV-2 target nucleic acids are DETECTED.  The SARS-CoV-2 RNA is generally detectable in upper respiratory specimens during the acute phase of infection. Positive results are indicative of the presence of the identified virus, but do not rule out bacterial infection or co-infection with other pathogens not detected by the test. Clinical correlation with patient  history and other diagnostic information is necessary to determine patient infection status. The expected result is Negative.  Fact Sheet for Patients: EntrepreneurPulse.com.au  Fact Sheet for Healthcare Providers: IncredibleEmployment.be  This test is not yet approved or cleared by the [place] FDA and  has been authorized for detection and/or diagnosis of SARS-CoV-2 by FDA under an Emergency Use Authorization (EUA).  This EUA will remain in effect (meaning this test  can be used) for the duration of  the COVID-19 declaration under Section 564(b)(1) of the Act, 21 U.S.C. section 360bbb-3(b)(1), unless the authorization is terminated or revoked sooner.     Influenza A by PCR NEGATIVE NEGATIVE Final   Influenza B by PCR NEGATIVE NEGATIVE Final    Comment: (NOTE) The Xpert Xpress SARS-CoV-2/FLU/RSV plus assay is intended as an aid in the diagnosis of influenza from Nasopharyngeal swab specimens and should not be used as a sole basis for treatment. Nasal washings and aspirates are unacceptable for Xpert Xpress SARS-CoV-2/FLU/RSV testing.  Fact Sheet for Patients: EntrepreneurPulse.com.au  Fact Sheet for Healthcare Providers: IncredibleEmployment.be  This test is not yet approved or cleared by the [place] FDA and has been authorized for detection and/or diagnosis of SARS-CoV-2 by FDA under an Emergency Use Authorization (EUA). This EUA will remain in effect (meaning this test can be used) for the duration of the COVID-19 declaration under Section 564(b)(1) of the Act, 21 U.S.C. section 360bbb-3(b)(1), unless the authorization is terminated or revoked.  Performed at [facility], [address] [place], [place] [address]   MRSA Next Gen by PCR, Nasal     Status: None   Collection Time: 11/26/21 11:44 PM   Specimen: Nasal Mucosa; Nasal Swab  Result Value Ref Range Status   MRSA by  PCR Next Gen NOT DETECTED NOT DETECTED Final    Comment: (NOTE) The GeneXpert MRSA Assay (FDA approved for NASAL specimens only), is one component of a comprehensive MRSA colonization surveillance program. It is not intended to diagnose MRSA infection nor to guide or monitor treatment for MRSA infections. Test performance is not FDA approved in patients less than 62 years old. Performed at [facility], [place] [address], [place], [place] [address]     Radiology Studies: MR Brain W and Wo Contrast  Result Date: 11/28/2021 CLINICAL DATA:  Initial evaluation for brain mass. EXAM: MRI HEAD WITHOUT AND WITH CONTRAST MRV HEAD WITHOUT CONTRAST TECHNIQUE: Multiplanar, multiecho pulse sequences of the brain and surrounding structures were obtained without and with intravenous  contrast. Angiographic images of the intracranial venous structures were obtained using MRV technique without intravenous contrast. CONTRAST:  40mL GADAVIST GADOBUTROL 1 MMOL/ML IV SOLN COMPARISON:  Prior CT from 11/26/2021. FINDINGS: MRI HEAD FINDINGS: Brain: Cerebral volume within normal limits. Multiple scattered intraparenchymal lesions are seen involving both cerebral hemispheres as well as the cerebellum, consistent with widespread metastatic disease. There are at least 20 lesions. For reference purposes, the largest lesion within the right cerebral hemisphere is present at the right temporal lobe in measures 1.5 x 1.2 x 0.7 cm (series 26, image 22). The largest lesion within the left cerebral hemisphere is positioned at the left occipital pole and measures 1.5 x 1.7 x 1.7 cm (series 26, image 21). Largest distinct infratentorial lesion is seen within the central cerebellum and measures 2.1 x 2.2 x 2.5 cm (series 26, image 12). Several of these lesions are somewhat cystic in appearance. Few lesions demonstrate intrinsic T1 hyperintensity and/or SWI signal, consistent with hemorrhage and/or blood products, most  notable at the central cerebellum (series 11, image 11). Mild localized edema about several of these lesions without significant regional mass effect. An additional large enhancing extra-axial mass positioned at the posterior aspect of the superior sagittal sinus measures 3.2 x 6.6 x 4.8 cm (series 28, image 13). Scattered areas of internal susceptibility artifact consistent with blood products. Mild localized vasogenic edema without significant mass effect. This lesion is somewhat indeterminate, and could reflect an additional dural-based metastasis. Concomitant meningioma could also be considered. This lesion invades and obliterates the adjacent superior sagittal sinus, better evaluated on concomitant MRV. Mild diffuse smooth pachymeningeal thickening and enhancement is likely secondary. No definite evidence for acute or subacute ischemia. Note made of a punctate focus of diffusion abnormality at the right basal ganglia, favored to reflect a small metastasis rather than infarct (series 5, image 75). No midline shift or hydrocephalus. No other extra-axial fluid collection. Pituitary gland suprasellar region within normal limits. Vascular: Major intracranial arterial vascular flow voids are maintained. Skull and upper cervical spine: Craniocervical junction within normal limits. Heterogeneous signal intensity seen within the visualized bone marrow without focal marrow replacing lesion. No scalp soft tissue abnormality. Sinuses/Orbits: Globes within normal limits. Increased CSF signal intensity seen along the optic nerve sheaths bilaterally, suggesting elevated intracranial pressures. Orbital soft tissues otherwise unremarkable. Moderate mucosal thickening within the right maxillary sinus. Paranasal sinuses are otherwise largely clear. Small right with trace left mastoid effusions. Other: None. MRV HEAD FINDINGS: The above described dural based mass again noted at the posterior aspect of the superior sagittal sinus.  The mass invades and obliterates the superior sagittal sinus at this level (series 1, image 98). Tubular filling defect is seen within the superior sagittal sinus anterior to the mass, likely a small amount of nonocclusive thrombus (series 13, images 88, 102). Superior sagittal sinus otherwise patent anteriorly. Additional extension of the lesion to involve the posterior aspect of the suture superior sagittal sinus as it courses towards the torcula (series 12, image 82) the torcula itself is patent. Transverse and sigmoid sinuses are patent as are the visualized proximal internal jugular veins. Straight sinus, vein of Galen, inferior sagittal sinus, internal cerebral veins, and basal veins of Rosenthal are patent. No abnormality about the cavernous sinus. Superior orbital veins symmetric and within normal limits. No appreciable cortical venous thrombosis. IMPRESSION: MRI HEAD IMPRESSION: 1. 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 Mild localized edema with hemorrhagic blood products about several of these lesions  without significant regional mass effect or midline shift. 2. 3.2 x 6.6 x 4.8 cm enhancing extra-axial mass at the posterior aspect of the superior sagittal sinus, indeterminate. While this finding could reflect an additional dural-based metastasis, a possible concomitant meningioma could also be considered. MRV HEAD IMPRESSION: 1. Invasion and obliteration of the posterior superior sagittal sinus by the above described dural based mass. Small amount of additional nonocclusive thrombus within the superior sagittal sinus anterior to the mass as above. 2. Remainder of the major dural sinuses are otherwise patent. Electronically Signed   By: Jeannine Boga M.D.   On: 11/28/2021 00:09   MR Venogram Head  Result Date: 11/28/2021 CLINICAL DATA:  Initial evaluation for brain mass. EXAM: MRI HEAD WITHOUT AND WITH CONTRAST MRV HEAD WITHOUT  CONTRAST TECHNIQUE: Multiplanar, multiecho pulse sequences of the brain and surrounding structures were obtained without and with intravenous contrast. Angiographic images of the intracranial venous structures were obtained using MRV technique without intravenous contrast. CONTRAST:  20mL GADAVIST GADOBUTROL 1 MMOL/ML IV SOLN COMPARISON:  Prior CT from 11/26/2021. FINDINGS: MRI HEAD FINDINGS: Brain: Cerebral volume within normal limits. Multiple scattered intraparenchymal lesions are seen involving both cerebral hemispheres as well as the cerebellum, consistent with widespread metastatic disease. There are at least 20 lesions. For reference purposes, the largest lesion within the right cerebral hemisphere is present at the right temporal lobe in measures 1.5 x 1.2 x 0.7 cm (series 26, image 22). The largest lesion within the left cerebral hemisphere is positioned at the left occipital pole and measures 1.5 x 1.7 x 1.7 cm (series 26, image 21). Largest distinct infratentorial lesion is seen within the central cerebellum and measures 2.1 x 2.2 x 2.5 cm (series 26, image 12). Several of these lesions are somewhat cystic in appearance. Few lesions demonstrate intrinsic T1 hyperintensity and/or SWI signal, consistent with hemorrhage and/or blood products, most notable at the central cerebellum (series 11, image 11). Mild localized edema about several of these lesions without significant regional mass effect. An additional large enhancing extra-axial mass positioned at the posterior aspect of the superior sagittal sinus measures 3.2 x 6.6 x 4.8 cm (series 28, image 13). Scattered areas of internal susceptibility artifact consistent with blood products. Mild localized vasogenic edema without significant mass effect. This lesion is somewhat indeterminate, and could reflect an additional dural-based metastasis. Concomitant meningioma could also be considered. This lesion invades and obliterates the adjacent superior sagittal  sinus, better evaluated on concomitant MRV. Mild diffuse smooth pachymeningeal thickening and enhancement is likely secondary. No definite evidence for acute or subacute ischemia. Note made of a punctate focus of diffusion abnormality at the right basal ganglia, favored to reflect a small metastasis rather than infarct (series 5, image 75). No midline shift or hydrocephalus. No other extra-axial fluid collection. Pituitary gland suprasellar region within normal limits. Vascular: Major intracranial arterial vascular flow voids are maintained. Skull and upper cervical spine: Craniocervical junction within normal limits. Heterogeneous signal intensity seen within the visualized bone marrow without focal marrow replacing lesion. No scalp soft tissue abnormality. Sinuses/Orbits: Globes within normal limits. Increased CSF signal intensity seen along the optic nerve sheaths bilaterally, suggesting elevated intracranial pressures. Orbital soft tissues otherwise unremarkable. Moderate mucosal thickening within the right maxillary sinus. Paranasal sinuses are otherwise largely clear. Small right with trace left mastoid effusions. Other: None. MRV HEAD FINDINGS: The above described dural based mass again noted at the posterior aspect of the superior sagittal sinus. The mass invades and obliterates the superior sagittal  sinus at this level (series 1, image 98). Tubular filling defect is seen within the superior sagittal sinus anterior to the mass, likely a small amount of nonocclusive thrombus (series 13, images 88, 102). Superior sagittal sinus otherwise patent anteriorly. Additional extension of the lesion to involve the posterior aspect of the suture superior sagittal sinus as it courses towards the torcula (series 12, image 82) the torcula itself is patent. Transverse and sigmoid sinuses are patent as are the visualized proximal internal jugular veins. Straight sinus, vein of Galen, inferior sagittal sinus, internal  cerebral veins, and basal veins of Rosenthal are patent. No abnormality about the cavernous sinus. Superior orbital veins symmetric and within normal limits. No appreciable cortical venous thrombosis. IMPRESSION: MRI HEAD IMPRESSION: 1. 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 Mild localized edema with hemorrhagic blood products about several of these lesions without significant regional mass effect or midline shift. 2. 3.2 x 6.6 x 4.8 cm enhancing extra-axial mass at the posterior aspect of the superior sagittal sinus, indeterminate. While this finding could reflect an additional dural-based metastasis, a possible concomitant meningioma could also be considered. MRV HEAD IMPRESSION: 1. Invasion and obliteration of the posterior superior sagittal sinus by the above described dural based mass. Small amount of additional nonocclusive thrombus within the superior sagittal sinus anterior to the mass as above. 2. Remainder of the major dural sinuses are otherwise patent. Electronically Signed   By: Jeannine Boga M.D.   On: 11/28/2021 00:09   CT BIOPSY  Result Date: 11/28/2021 INDICATION: 66 year old with evidence of diffuse metastatic disease. Tissue diagnosis is needed. EXAM: CT-GUIDED BIOPSY OF ANTERIOR ABDOMINAL PERITONEAL MASS MEDICATIONS: Moderate sedation ANESTHESIA/SEDATION: Moderate (conscious) sedation was employed during this procedure. A total of Versed 2.0mg  and fentanyl 100 mcg was administered intravenously at the order of the provider performing the procedure. Total intra-service moderate sedation time: 14 minutes. Patient's level of consciousness and vital signs were monitored continuously by radiology nurse throughout the procedure under the supervision of the provider performing the procedure. FLUOROSCOPY TIME:  None COMPLICATIONS: None immediate. PROCEDURE: Informed written consent was obtained from the patient after a thorough  discussion of the procedural risks, benefits and alternatives. All questions were addressed. A timeout was performed prior to the initiation of the procedure. Patient was placed supine on CT scanner. Images of the upper abdomen are obtained. The large mass in the anterior upper abdomen was identified and targeted. The skin was prepped with chlorhexidine and sterile field was created. Skin and soft tissues were anesthetized with 1% lidocaine. A small incision was made. Using CT guidance, 17 gauge coaxial needle was directed into the anterior abdominal mass. Multiple core biopsies were obtained with an 18 gauge core device and specimens were placed in formalin. 17 gauge needle was removed without complication. Bandage placed over the puncture site. FINDINGS: Again noted is large mass in the anterior upper abdomen adjacent to the abdominal wall. This structure measures up to 5.1 cm. Needle position was confirmed within the lesion. Specimens placed in formalin. IMPRESSION: CT-guided core biopsies of the anterior upper abdominal mass. Electronically Signed   By: Markus Daft M.D.   On: 11/28/2021 12:50   IR IMAGING GUIDED PORT INSERTION  Result Date: 11/28/2021 INDICATION: 65 year old with evidence of metastatic disease. Port-A-Cath needed for therapy. EXAM: FLUOROSCOPIC AND ULTRASOUND GUIDED PLACEMENT OF A SUBCUTANEOUS PORT COMPARISON:  None. MEDICATIONS: Moderate sedation ANESTHESIA/SEDATION: Versed 2.0 mg IV; Fentanyl 100 mcg IV; Moderate Sedation Time:  44 minutes  The patient was continuously monitored during the procedure by the interventional radiology nurse under my direct supervision. FLUOROSCOPY TIME:  24 seconds, 1 mGy COMPLICATIONS: None immediate. PROCEDURE: The procedure, risks, benefits, and alternatives were explained to the patient. Questions regarding the procedure were encouraged and answered. The patient understands and consents to the procedure. Patient was placed supine on the interventional table.  Ultrasound confirmed a patent right internal jugular vein. Ultrasound image was saved for documentation. The right chest and neck were cleaned with a skin antiseptic and a sterile drape was placed. Maximal barrier sterile technique was utilized including caps, mask, sterile gowns, sterile gloves, sterile drape, hand hygiene and skin antiseptic. The right neck was anesthetized with 1% lidocaine. Small incision was made in the right neck with a blade. Micropuncture set was placed in the right internal jugular vein with ultrasound guidance. The micropuncture wire was used for measurement purposes. The right chest was anesthetized with 1% lidocaine with epinephrine. #15 blade was used to make an incision and a subcutaneous port pocket was formed. [place] was assembled. Subcutaneous tunnel was formed with a stiff tunneling device. The port catheter was brought through the subcutaneous tunnel. The port was placed in the subcutaneous pocket. The micropuncture set was exchanged for a peel-away sheath. The catheter was placed through the peel-away sheath and the tip was positioned at the superior cavoatrial junction. Catheter placement was confirmed with fluoroscopy. The port was accessed and flushed with heparinized saline. The port pocket was closed using two layers of absorbable sutures and Dermabond. The vein skin site was closed using a single layer of absorbable suture and Dermabond. Sterile dressings were applied. Patient tolerated the procedure well without an immediate complication. Ultrasound and fluoroscopic images were taken and saved for this procedure. IMPRESSION: Placement of a subcutaneous power-injectable port device. Catheter tip at the superior cavoatrial junction. Electronically Signed   By: Markus Daft M.D.   On: 11/28/2021 17:48     Scheduled Meds:  chlorhexidine  15 mL Mouth Rinse BID   Chlorhexidine Gluconate Cloth  6 each Topical Daily   dexamethasone (DECADRON) injection  4 mg  Intravenous Q8H   feeding supplement  237 mL Oral BID BM   insulin aspart  0-15 Units Subcutaneous TID WC   mouth rinse  15 mL Mouth Rinse q12n4p   nicotine  7 mg Transdermal Daily   pantoprazole  40 mg Oral Daily   sodium chloride flush  10-40 mL Intracatheter Q12H   Continuous Infusions:   LOS: 3 days    Time spent: 25 mins    Shawna Clamp, MD [facility]   If 7PM-7AM, please contact night-coverage

## 2021-11-29 NOTE — Plan of Care (Signed)

## 2021-11-29 NOTE — Progress Notes (Signed)
  Radiation Oncology         [phone] ________________________________  Name: Ryan [name] MRN: [id number]  Date: 11/28/2021  DOB: Mar 03, 1956  INPATIENT  SIMULATION AND TREATMENT PLANNING NOTE    ICD-10-CM   1. 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  NARRATIVE:  The patient was brought to the [facility].  Identity was confirmed.  All relevant records and images related to the planned course of therapy were reviewed.  The patient freely provided informed written consent to proceed with treatment after reviewing the details related to the planned course of therapy. The consent form was witnessed and verified by the simulation staff.  Then, the patient was set-up in a stable reproducible  supine position for radiation therapy.  CT images were obtained.  Surface markings were placed.  The CT images were loaded into the planning software.  Then the target and avoidance structures were contoured.  Treatment planning then occurred.  The radiation prescription was entered and confirmed.  Then, I designed and supervised the construction of a total of 6 medically necessary complex treatment devices, including a BodyFix immobilization mold custom fitted to the patient along with 5 multileaf collimators conformally shaped radiation around the treatment target while shielding critical structures such as the heart and spinal cord maximally.  I have requested : 3D Simulation  I have requested a DVH of the following structures: Left lung, right lung, spinal cord, heart, esophagus, and target.    PLAN:  The patient will receive 30 Gy in 10 fractions.  ________________________________  Sheral Apley Tammi Klippel, M.D.

## 2021-11-30 ENCOUNTER — Inpatient Hospital Stay (HOSPITAL_COMMUNITY): Payer: BC Managed Care – PPO

## 2021-11-30 DIAGNOSIS — E871 Hypo-osmolality and hyponatremia: Secondary | ICD-10-CM | POA: Diagnosis not present

## 2021-11-30 LAB — BASIC METABOLIC PANEL
Anion gap: 11 (ref 5–15)
BUN: 16 mg/dL (ref 8–23)
CO2: 23 mmol/L (ref 22–32)
Calcium: 9.2 mg/dL (ref 8.9–10.3)
Chloride: 92 mmol/L — ABNORMAL LOW (ref 98–111)
Creatinine, Ser: 0.48 mg/dL — ABNORMAL LOW (ref 0.61–1.24)
GFR, Estimated: >60 mL/min (ref >60)
Glucose, Bld: 163 mg/dL — ABNORMAL HIGH (ref 70–99)
Potassium: 4 mmol/L (ref 3.5–5.1)
Sodium: 126 mmol/L — ABNORMAL LOW (ref 135–145)

## 2021-11-30 LAB — GLUCOSE, CAPILLARY
Glucose-Capillary: 145 mg/dL — ABNORMAL HIGH (ref 70–99)
Glucose-Capillary: 221 mg/dL — ABNORMAL HIGH (ref 70–99)
Glucose-Capillary: 178 mg/dL — ABNORMAL HIGH (ref 70–99)
Glucose-Capillary: 162 mg/dL — ABNORMAL HIGH (ref 70–99)

## 2021-11-30 IMAGING — DX DG CHEST 1V PORT
1 series · 1 of 1 positions shown · non-contrast
Comparison: None.

CLINICAL DATA: Cough, prostate cancer, COVID

EXAM:
PORTABLE CHEST 1 VIEW

[chest ap]
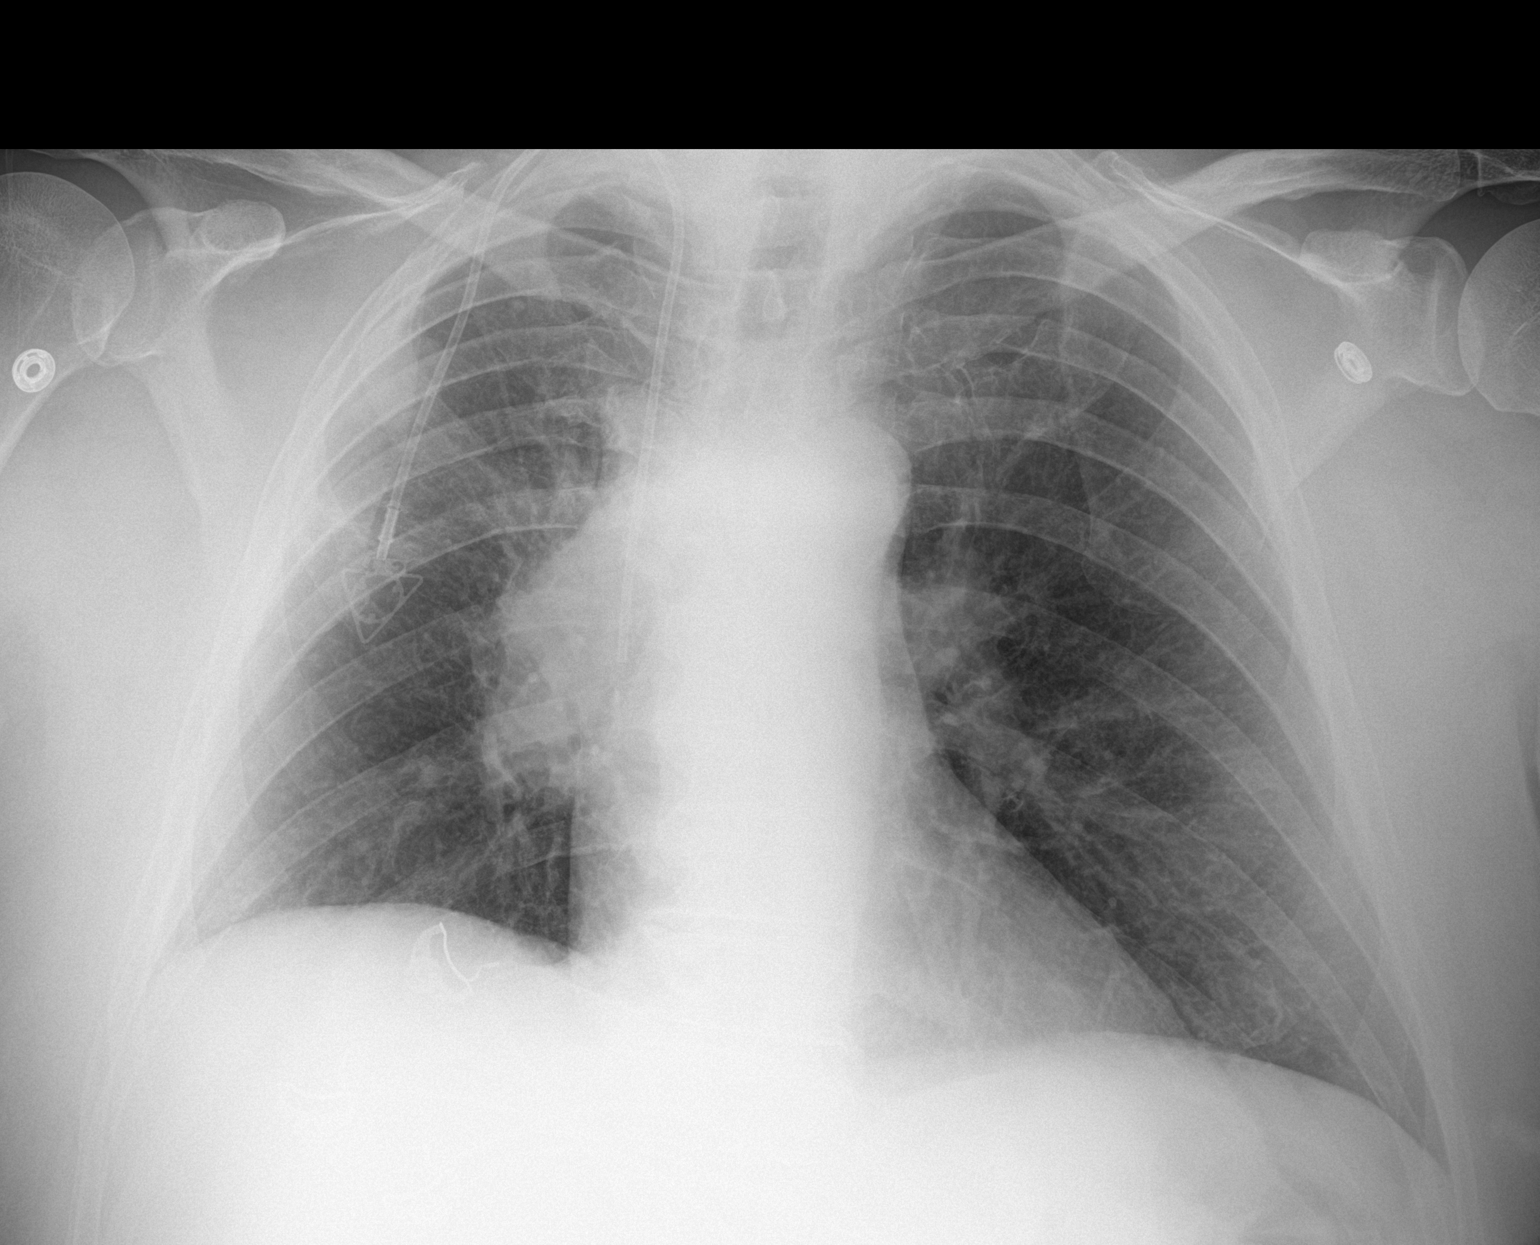

[1 of 1 positions shown; findings below may reference images not displayed]

FINDINGS: Right Port-A-Cath tip at the cavoatrial junction. Heart is normal
size. Left lung clear. Right hilar and mediastinal adenopathy again
noted. Peripheral right upper lobe nodule/mass again noted. No
effusions. No acute bony abnormality.
IMPRESSION: Right upper lobe mass with right hilar and mediastinal adenopathy.

## 2021-11-30 MED ORDER — ENSURE ENLIVE PO LIQD
237.0000 mL | Freq: Two times a day (BID) | ORAL | Status: DC
  Administered 2021-12-01 – 2021-12-03 (×3): 237 mL via ORAL

## 2021-11-30 MED ORDER — ENALAPRILAT 1.25 MG/ML IV SOLN
0.6250 mg | Freq: Four times a day (QID) | INTRAVENOUS | Status: DC | PRN
  Administered 2021-11-30 – 2021-12-02 (×6): 0.625 mg via INTRAVENOUS
  Filled 2021-11-30 (×6): qty 1

## 2021-11-30 MED ORDER — LORAZEPAM 1 MG PO TABS
1.0000 mg | ORAL_TABLET | Freq: Four times a day (QID) | ORAL | Status: DC | PRN
  Administered 2021-11-30: 0.5 mg via ORAL
  Administered 2021-11-30 – 2021-12-04 (×8): 1 mg via ORAL
  Filled 2021-11-30 (×9): qty 1

## 2021-11-30 MED ORDER — DIPHENHYDRAMINE HCL 25 MG PO CAPS
25.0000 mg | ORAL_CAPSULE | Freq: Four times a day (QID) | ORAL | Status: DC | PRN
  Administered 2021-11-30 – 2021-12-01 (×2): 25 mg via ORAL
  Filled 2021-11-30 (×2): qty 1

## 2021-11-30 MED ORDER — PROSOURCE PLUS PO LIQD
30.0000 mL | Freq: Every day | ORAL | Status: DC
  Administered 2021-11-30 – 2021-12-04 (×5): 30 mL via ORAL
  Filled 2021-11-30 (×5): qty 30

## 2021-11-30 MED ORDER — BOOST / RESOURCE BREEZE PO LIQD CUSTOM
1.0000 | ORAL | Status: DC
  Administered 2021-12-01 – 2021-12-03 (×2): 1 via ORAL

## 2021-11-30 MED ORDER — ADULT MULTIVITAMIN W/MINERALS CH
1.0000 | ORAL_TABLET | Freq: Every day | ORAL | Status: DC
  Administered 2021-11-30 – 2021-12-05 (×6): 1 via ORAL
  Filled 2021-11-30 (×6): qty 1

## 2021-11-30 MED ORDER — DIPHENHYDRAMINE HCL 25 MG PO CAPS
25.0000 mg | ORAL_CAPSULE | Freq: Once | ORAL | Status: AC
  Administered 2021-11-30: 25 mg via ORAL

## 2021-11-30 MED ORDER — LORAZEPAM 0.5 MG PO TABS
0.5000 mg | ORAL_TABLET | Freq: Four times a day (QID) | ORAL | Status: DC | PRN
  Administered 2021-11-30: 0.5 mg via ORAL
  Filled 2021-11-30: qty 1

## 2021-11-30 MED ORDER — ORAL CARE MOUTH RINSE
15.0000 mL | Freq: Two times a day (BID) | OROMUCOSAL | Status: DC
  Administered 2021-11-30 – 2021-12-05 (×9): 15 mL via OROMUCOSAL

## 2021-11-30 MED ORDER — DIPHENHYDRAMINE HCL 25 MG PO CAPS
ORAL_CAPSULE | ORAL | Status: AC
  Filled 2021-11-30: qty 1

## 2021-11-30 MED ORDER — AMLODIPINE BESYLATE 5 MG PO TABS
5.0000 mg | ORAL_TABLET | Freq: Every day | ORAL | Status: DC
  Administered 2021-11-30 – 2021-12-01 (×2): 5 mg via ORAL
  Filled 2021-11-30 (×2): qty 1

## 2021-11-30 NOTE — Plan of Care (Signed)

## 2021-11-30 NOTE — Progress Notes (Signed)
PROGRESS NOTE    Ryan [name]  KYH:[id number] DOB: May 20, 1956 DOA: 11/26/2021  PCP: [name], DO   Brief Narrative:  This 65 years old male with PMH significant for prostate cancer, type 2 diabetes presented in the ED with 3 weeks history of progressive weakness, decreased appetite, 20 pound weight loss.  Patient also been experiencing episodes of severe dizziness and disequilibrium. He reports he was diagnosed with COVID 3 weeks ago.  Work-up in the ED shows sodium 118.  CT chest showed 2.9 cm lobulated noncalcified pleural-based nodule in the right upper lobe suggesting possible primary malignant neoplasm.  There is multiple lesions noted in the brain, adrenal gland and nearby lymph nodes suggesting metastasis. 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  Sodium is slowly improving with fluid restriction.  Oncology is consulted recommended dexamethasone, MRI brain confirms mets.  Patient underwent CT-guided biopsy of abdominal peritoneal mass.  Patient also need Port-A-Cath placement for chemotherapy.  Assessment & Plan:   Principal Problem:   Acute hyponatremia Active Problems:   Metastatic cancer to brain ([place])   Lung cancer ([place])   DM2 (diabetes mellitus, type 2) ([facility])  Hyponatremia: Could be due to SIADH in the setting of apparent new metastatic lung CA. Sodium slowly improving with fluid restriction. Continue to monitor 118> 117> 120> 121> 123> 124> 125>124 >126. Patient gets frustrated and wants to be discharged.  We explained the rationale for not discharging until sodium gets within 130 range.   Explained to the patient that he is at risk for seizures or life-threatening arrhythmias.  He understood.  Metastatic lung CA with mets in the brain, adrenal gland: Lung nodule and multiple lesions in brain, adrenal gland and nearby lymph node concerning for metastasis with primary lung carcinoma until  proven otherwise. IR consulted for biopsy of peritoneal lesion. Oncology is consulted,  recommended to continue dexamethasone. Oncology also recommended radiation oncology consult for radiation to the brain and possibly chest due to impending SVC syndrome. MRI brain confirms the metastatic lesions in the brain.Invasion and obliteration of the posterior superior sagittal sinus by the above described dural based mass. Patient underwent successful CT-guided biopsy of the abdominal mass, he also underwent Port-A-Cath placement by IR on 12/9. Patient is going to start radiation treatment soon.  Oncology is following. Patient will need outpatient PET/CT imaging.  History of prostate cancer: S/p prostatectomy with normal PSA.  Diabetes mellitus: Continue regular insulin sliding scale.  Essential hypertension: Hold lisinopril. Start amlodipine 5 mg daily.  Goals of care discussion.: Patient does have stage IV lung cancer with metastasis. Patient needs goals of care discussion about prognosis.  DVT prophylaxis:  SCDs Code Status: Full code. Family Communication: Wife in the room. Disposition Plan:  Status is: Inpatient  Remains inpatient appropriate because:  Hyponatremia, metastatic lung cancer requiring complete work-up,  underwent biopsy of the peritoneal mass. Port-A-Cath placement, Patient is going to start radiation therapy to the brain and chest.  Anticipated discharge home once sodium improves above 130.  Consultants:  Oncology, radiation oncology  Procedures: CT guided biopsy of the peritoneal mass.  Port a cath placement  Antimicrobials:   Anti-infectives (From admission, onward)    None       Subjective: Patient was seen and examined at bedside.  Overnight events noted.   Patient seems comfortable,  states he understood the rationale for staying long in the hospital. Patient underwent CT-guided biopsy of abdominal  mass, Port-A-Cath placement yesterday by IR. He  is just hoping that his sodium gets better sooner so he can leave hospital. Last night he was given Percocet, states he has developed some reaction which got better with Benadryl.  Objective: Vitals:   11/30/21 0410 11/30/21 0450 11/30/21 0510 11/30/21 0800  BP: (!) 190/97 (!) 191/96 (!) 125/105   Pulse: 87 82 60   Resp: 18 18 19    Temp:    (!) 97 F (36.1 C)  TempSrc:    Axillary  SpO2: 100% 97% (!) 78%   Weight:      Height:        Intake/Output Summary (Last 24 hours) at 11/30/2021 1029 Last data filed at 11/30/2021 1006 Gross per 24 hour  Intake 370 ml  Output --  Net 370 ml   Filed Weights   11/26/21 0924  Weight: 106.6 kg    Examination:  General exam: Appears anxious, fidgety, comfortable,  deconditioned, NAD. Respiratory system: Clear to auscultation bilaterally. Respiratory effort normal. RR15 Cardiovascular system: S1-S2 heard, regular rate and rhythm, no murmur. Gastrointestinal system: Abdomen is soft, nontender, nondistended, BS +. Central nervous system: Alert and oriented x 3. No focal neurological deficits. Extremities: No edema, no cyanosis, no clubbing. Skin: No rashes, lesions or ulcers Psychiatry: Judgement and insight appear normal. Mood & affect appropriate.     Data Reviewed: I have personally reviewed following labs and imaging studies  CBC: Recent Labs  Lab 11/26/21 1130 11/29/21 0325  WBC 8.7 6.7  NEUTROABS 7.2  --   HGB 12.0* 12.2*  HCT 34.3* 37.0*  MCV 90.5 95.1  PLT 247 941   Basic Metabolic Panel: Recent Labs  Lab 11/26/21 1130 11/26/21 1329 11/27/21 1302 11/27/21 1830 11/28/21 0023 11/29/21 0325 11/30/21 0330  NA 118*   < > 123* 124* 125* 124* 126*  K 4.2   < > 4.0 4.7 4.1 4.2 4.0  CL 86*   < > 89* 90* 92* 91* 92*  CO2 23   < > 24 25 24  21* 23  GLUCOSE 119*   < > 187* 151* 126* 140* 163*  BUN 12   < > 14 16 17 19 16   CREATININE 0.43*   < > 0.56* 0.61 0.53* 0.53* 0.48*  CALCIUM 8.8*   < > 9.3 9.4 9.3 9.0 9.2  MG  1.6*  --   --   --   --  2.0  --   PHOS  --   --   --   --   --  3.2  --    < > = values in this interval not displayed.   GFR: Estimated Creatinine Clearance: 116.6 mL/min (A) (by C-G formula based on SCr of 0.48 mg/dL (L)). Liver Function Tests: Recent Labs  Lab 11/26/21 1130  AST 27  ALT 15  ALKPHOS 69  BILITOT 0.6  PROT 7.1  ALBUMIN 3.7   Recent Labs  Lab 11/26/21 1130  LIPASE 114*   No results for input(s): AMMONIA in the last 168 hours. Coagulation Profile: Recent Labs  Lab 11/28/21 0023  INR 0.9   Cardiac Enzymes: No results for input(s): CKTOTAL, CKMB, CKMBINDEX, TROPONINI in the last 168 hours. BNP (last 3 results) No results for input(s): PROBNP in the last 8760 hours. HbA1C: No results for input(s): HGBA1C in the last 72 hours.  CBG: Recent Labs  Lab 11/29/21 0748 11/29/21 1142 11/29/21 1626 11/29/21 2116 11/30/21 0809  GLUCAP 254* 227* 203* 218* 145*   Lipid  Profile: No results for input(s): CHOL, HDL, LDLCALC, TRIG, CHOLHDL, LDLDIRECT in the last 72 hours. Thyroid Function Tests: No results for input(s): TSH, T4TOTAL, FREET4, T3FREE, THYROIDAB in the last 72 hours. Anemia Panel: No results for input(s): VITAMINB12, FOLATE, FERRITIN, TIBC, IRON, RETICCTPCT in the last 72 hours. Sepsis Labs: No results for input(s): PROCALCITON, LATICACIDVEN in the last 168 hours.  Recent Results (from the past 240 hour(s))  Resp Panel by RT-PCR (Flu A&B, Covid) Nasopharyngeal Swab     Status: Abnormal   Collection Time: 11/26/21  4:04 PM   Specimen: Nasopharyngeal Swab; Nasopharyngeal(NP) swabs in vial transport medium  Result Value Ref Range Status   SARS Coronavirus 2 by RT PCR POSITIVE (A) NEGATIVE Final    Comment: RESULT CALLED TO, READ BACK BY AND VERIFIED WITH: Raynelle Bring RN @1646  11/26/2021 OLSONM (NOTE) SARS-CoV-2 target nucleic acids are DETECTED.  The SARS-CoV-2 RNA is generally detectable in upper respiratory specimens during the acute phase  of infection. Positive results are indicative of the presence of the identified virus, but do not rule out bacterial infection or co-infection with other pathogens not detected by the test. Clinical correlation with patient history and other diagnostic information is necessary to determine patient infection status. The expected result is Negative.  Fact Sheet for Patients: EntrepreneurPulse.com.au  Fact Sheet for Healthcare Providers: IncredibleEmployment.be  This test is not yet approved or cleared by the [place] FDA and  has been authorized for detection and/or diagnosis of SARS-CoV-2 by FDA under an Emergency Use Authorization (EUA).  This EUA will remain in effect (meaning this test  can be used) for the duration of  the COVID-19 declaration under Section 564(b)(1) of the Act, 21 U.S.C. section 360bbb-3(b)(1), unless the authorization is terminated or revoked sooner.     Influenza A by PCR NEGATIVE NEGATIVE Final   Influenza B by PCR NEGATIVE NEGATIVE Final    Comment: (NOTE) The Xpert Xpress SARS-CoV-2/FLU/RSV plus assay is intended as an aid in the diagnosis of influenza from Nasopharyngeal swab specimens and should not be used as a sole basis for treatment. Nasal washings and aspirates are unacceptable for Xpert Xpress SARS-CoV-2/FLU/RSV testing.  Fact Sheet for Patients: EntrepreneurPulse.com.au  Fact Sheet for Healthcare Providers: IncredibleEmployment.be  This test is not yet approved or cleared by the [place] FDA and has been authorized for detection and/or diagnosis of SARS-CoV-2 by FDA under an Emergency Use Authorization (EUA). This EUA will remain in effect (meaning this test can be used) for the duration of the COVID-19 declaration under Section 564(b)(1) of the Act, 21 U.S.C. section 360bbb-3(b)(1), unless the authorization is terminated or revoked.  Performed at [facility], [address] [place], [place] [address]   MRSA Next Gen by PCR, Nasal     Status: None   Collection Time: 11/26/21 11:44 PM   Specimen: Nasal Mucosa; Nasal Swab  Result Value Ref Range Status   MRSA by PCR Next Gen NOT DETECTED NOT DETECTED Final    Comment: (NOTE) The GeneXpert MRSA Assay (FDA approved for NASAL specimens only), is one component of a comprehensive MRSA colonization surveillance program. It is not intended to diagnose MRSA infection nor to guide or monitor treatment for MRSA infections. Test performance is not FDA approved in patients less than 89 years old. Performed at [facility], [place] [address], [place], [place] [address]     Radiology Studies: IR IMAGING GUIDED PORT INSERTION  Result Date: 11/28/2021 INDICATION: 64 year old with evidence  of metastatic disease. Port-A-Cath needed for therapy. EXAM: FLUOROSCOPIC AND ULTRASOUND GUIDED PLACEMENT OF A SUBCUTANEOUS PORT COMPARISON:  None. MEDICATIONS: Moderate sedation ANESTHESIA/SEDATION: Versed 2.0 mg IV; Fentanyl 100 mcg IV; Moderate Sedation Time:  44 minutes The patient was continuously monitored during the procedure by the interventional radiology nurse under my direct supervision. FLUOROSCOPY TIME:  24 seconds, 1 mGy COMPLICATIONS: None immediate. PROCEDURE: The procedure, risks, benefits, and alternatives were explained to the patient. Questions regarding the procedure were encouraged and answered. The patient understands and consents to the procedure. Patient was placed supine on the interventional table. Ultrasound confirmed a patent right internal jugular vein. Ultrasound image was saved for documentation. The right chest and neck were cleaned with a skin antiseptic and a sterile drape was placed. Maximal barrier sterile technique was utilized including caps, mask, sterile gowns, sterile gloves, sterile drape, hand hygiene and skin antiseptic. The right neck was anesthetized with 1%  lidocaine. Small incision was made in the right neck with a blade. Micropuncture set was placed in the right internal jugular vein with ultrasound guidance. The micropuncture wire was used for measurement purposes. The right chest was anesthetized with 1% lidocaine with epinephrine. #15 blade was used to make an incision and a subcutaneous port pocket was formed. [place] was assembled. Subcutaneous tunnel was formed with a stiff tunneling device. The port catheter was brought through the subcutaneous tunnel. The port was placed in the subcutaneous pocket. The micropuncture set was exchanged for a peel-away sheath. The catheter was placed through the peel-away sheath and the tip was positioned at the superior cavoatrial junction. Catheter placement was confirmed with fluoroscopy. The port was accessed and flushed with heparinized saline. The port pocket was closed using two layers of absorbable sutures and Dermabond. The vein skin site was closed using a single layer of absorbable suture and Dermabond. Sterile dressings were applied. Patient tolerated the procedure well without an immediate complication. Ultrasound and fluoroscopic images were taken and saved for this procedure. IMPRESSION: Placement of a subcutaneous power-injectable port device. Catheter tip at the superior cavoatrial junction. Electronically Signed   By: Markus Daft M.D.   On: 11/28/2021 17:48     Scheduled Meds:  amLODipine  5 mg Oral Daily   chlorhexidine  15 mL Mouth Rinse BID   Chlorhexidine Gluconate Cloth  6 each Topical Daily   dexamethasone (DECADRON) injection  4 mg Intravenous Q8H   feeding supplement  237 mL Oral BID BM   insulin aspart  0-15 Units Subcutaneous TID WC   mouth rinse  15 mL Mouth Rinse BID   nicotine  7 mg Transdermal Daily   pantoprazole  40 mg Oral Daily   sodium chloride flush  10-40 mL Intracatheter Q12H   Continuous Infusions:   LOS: 4 days    Time spent: 25 mins    Shawna Clamp,  MD [facility]   If 7PM-7AM, please contact night-coverage

## 2021-11-30 NOTE — Progress Notes (Signed)
Initial Nutrition Assessment RD working remotely.  DOCUMENTATION CODES:   Not applicable  INTERVENTION:  - continue Ensure Enlive BID, each supplement provides 350 kcal and 20 grams of protein. - will order Boost Breeze once/day, each supplement provides 250 kcal and 9 grams of protein. - will order 30 ml Prosource Plus once/day, each supplement provides 100 kcal and 15 grams protein.  - will order 1 tablet multivitamin with minerals/day. - complete NFPE when feasible.    NUTRITION DIAGNOSIS:   Increased nutrient needs related to acute illness, cancer and cancer related treatments as evidenced by estimated needs.  GOAL:   Patient will meet greater than or equal to 90% of their needs  MONITOR:   PO intake, Supplement acceptance, Labs, Weight trends  REASON FOR ASSESSMENT:   Malnutrition Screening Tool  ASSESSMENT:   65 year old male with medical history of prostate cancer and type 2 DM. He presented to the ED with 3 week hx of progressive weakness, decreased appetite, and reported 20 lb weight loss in that time frame. He was experiencing episodes of severe dizziness and disequilibrium PTA. He reported being dx with COVID three weeks ago. In the ED, CT chest showed a nodule in RUL suggestive of possible primary malignant neoplasm; multiple lesions noted in the brain, adrenal gland, and near by lymph nodes suggesting metastasis. 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 Pending Port-a-cath placement for chemo.  Patient noted to be out of the room to IR. Port-a-cath was placed by IR on 12/9.   Meal completion documentation since admission was 75% of breakfast, 100% of lunch, and 100% of dinner yesterday and 100% of breakfast today. Ensure Enlive ordered BID starting yesterday and he has accepted all bottles offered so far.   Information in H&P outlines that patient reported losing 20 lb in the past 3 weeks. Weight on 12/7 was  documented as 235 lb which appears to have been a stated weight. PTA the most recently documented weight was 271 lb on 01/01/19.   Non-pitting edema to BUE documented in the edema section of flow sheet.  Per notes: - metastatic lung cancer with mets to brain and adrenal gland--IR consulted for biopsy of peritoneal lesion - MRI brain confirmed metastatic lesions in the brain - plan to start XRT in the near future - need for outpatient PET/CT imaging   Labs reviewed; CBGs: 145 and 221 mg/dl, Na: 126 mmol/l, Cl: 92 mmol/l, creatinine: 0.48 mg/dl.  Medications reviewed; sliding scale novolog, 40 mg oral protonix/day.     NUTRITION - FOCUSED PHYSICAL EXAM:  RD working remotely.  Diet Order:   Diet Order             Diet regular Room service appropriate? Yes; Fluid consistency: Thin  Diet effective now                   EDUCATION NEEDS:   No education needs have been identified at this time  Skin:  Skin Assessment: Skin Integrity Issues: Skin Integrity Issues:: Other (Comment) Other: MASD bilateral groin and testicles  Last BM:  PTA/unknown  Height:   Ht Readings from Last 1 Encounters:  11/26/21 6\' 2"  (1.88 m)    Weight:   Wt Readings from Last 1 Encounters:  11/26/21 106.6 kg     Estimated Nutritional Needs:  Kcal:  2200-2500 kcal Protein:  110-125 grams Fluid:  >/= 2.5 L/day      Jarome Matin, MS, RD,  LDN, CNSC Inpatient Clinical Dietitian RD pager # available in [place]  After hours/weekend pager # available in [facility]

## 2021-11-30 NOTE — Progress Notes (Signed)
Pt complaining of pain in the back of neck radiating to right shoulder. Rates pain at level 8. Patient asked for Percocet for pain. Percocet given at 1117. Within the hour patient becomes very anxious and felt like throat was closing up. Seems to be the same kind of reaction he had last night. No facial swelling or rash. Dr. Dwyane Dee contacted. One time dose of benadryl given. Patient placed on [facility] for comfort. Voice sounds very raspy. Symptoms have improved with benadryl. Percocet has been dc'd and added to allergy list.

## 2021-12-01 ENCOUNTER — Ambulatory Visit
Admit: 2021-12-01 | Discharge: 2021-12-01 | Disposition: A | Discharge status: Home / Self Care | Payer: BC Managed Care – PPO | Attending: Radiation Oncology | Admitting: Radiation Oncology

## 2021-12-01 ENCOUNTER — Other Ambulatory Visit: Payer: Self-pay | Admitting: Radiation Oncology

## 2021-12-01 DIAGNOSIS — C7931 Secondary malignant neoplasm of brain: Secondary | ICD-10-CM

## 2021-12-01 DIAGNOSIS — E871 Hypo-osmolality and hyponatremia: Secondary | ICD-10-CM | POA: Diagnosis not present

## 2021-12-01 LAB — BASIC METABOLIC PANEL
Anion gap: 12 (ref 5–15)
Anion gap: 9 (ref 5–15)
BUN: 16 mg/dL (ref 8–23)
BUN: 16 mg/dL (ref 8–23)
CO2: 19 mmol/L — ABNORMAL LOW (ref 22–32)
CO2: 22 mmol/L (ref 22–32)
Calcium: 9.2 mg/dL (ref 8.9–10.3)
Calcium: 8.8 mg/dL — ABNORMAL LOW (ref 8.9–10.3)
Chloride: 90 mmol/L — ABNORMAL LOW (ref 98–111)
Chloride: 90 mmol/L — ABNORMAL LOW (ref 98–111)
Creatinine, Ser: 0.45 mg/dL — ABNORMAL LOW (ref 0.61–1.24)
Creatinine, Ser: 0.53 mg/dL — ABNORMAL LOW (ref 0.61–1.24)
GFR, Estimated: >60 mL/min (ref >60)
GFR, Estimated: >60 mL/min (ref >60)
Glucose, Bld: 155 mg/dL — ABNORMAL HIGH (ref 70–99)
Glucose, Bld: 196 mg/dL — ABNORMAL HIGH (ref 70–99)
Potassium: 4.2 mmol/L (ref 3.5–5.1)
Potassium: 4 mmol/L (ref 3.5–5.1)
Sodium: 121 mmol/L — ABNORMAL LOW (ref 135–145)
Sodium: 121 mmol/L — ABNORMAL LOW (ref 135–145)

## 2021-12-01 LAB — GLUCOSE, CAPILLARY
Glucose-Capillary: 143 mg/dL — ABNORMAL HIGH (ref 70–99)
Glucose-Capillary: 178 mg/dL — ABNORMAL HIGH (ref 70–99)
Glucose-Capillary: 217 mg/dL — ABNORMAL HIGH (ref 70–99)
Glucose-Capillary: 195 mg/dL — ABNORMAL HIGH (ref 70–99)

## 2021-12-01 LAB — MAGNESIUM: Magnesium: 1.9 mg/dL (ref 1.7–2.4)

## 2021-12-01 LAB — PHOSPHORUS: Phosphorus: 3.4 mg/dL (ref 2.5–4.6)

## 2021-12-01 MED ORDER — DOCUSATE SODIUM 100 MG PO CAPS
100.0000 mg | ORAL_CAPSULE | Freq: Two times a day (BID) | ORAL | Status: DC
  Administered 2021-12-01 – 2021-12-05 (×9): 100 mg via ORAL
  Filled 2021-12-01 (×9): qty 1

## 2021-12-01 MED ORDER — LEVALBUTEROL HCL 0.63 MG/3ML IN NEBU
0.6300 mg | INHALATION_SOLUTION | Freq: Three times a day (TID) | RESPIRATORY_TRACT | Status: DC | PRN
  Administered 2021-12-01: 0.63 mg via RESPIRATORY_TRACT
  Filled 2021-12-01: qty 3

## 2021-12-01 MED ORDER — MEMANTINE HCL 10 MG PO TABS: 10.0000 mg | ORAL_TABLET | Freq: Two times a day (BID) | ORAL | 4 refills | Status: DC

## 2021-12-01 MED ORDER — MEMANTINE HCL 5 MG PO TABS: ORAL_TABLET | ORAL | 0 refills | Status: DC

## 2021-12-01 MED ORDER — GUAIFENESIN-DM 100-10 MG/5ML PO SYRP
5.0000 mL | ORAL_SOLUTION | ORAL | Status: DC | PRN
  Administered 2021-12-01 – 2021-12-03 (×5): 5 mL via ORAL
  Filled 2021-12-01 (×5): qty 10

## 2021-12-01 MED ORDER — LEVALBUTEROL HCL 0.63 MG/3ML IN NEBU
INHALATION_SOLUTION | RESPIRATORY_TRACT | Status: AC
  Administered 2021-12-01: 0.63 mg via RESPIRATORY_TRACT
  Filled 2021-12-01: qty 3

## 2021-12-01 MED ORDER — AMLODIPINE BESYLATE 10 MG PO TABS
10.0000 mg | ORAL_TABLET | Freq: Every day | ORAL | Status: DC
  Administered 2021-12-02 – 2021-12-05 (×4): 10 mg via ORAL
  Filled 2021-12-01 (×4): qty 1

## 2021-12-01 MED ORDER — MEMANTINE HCL 10 MG PO TABS
5.0000 mg | ORAL_TABLET | Freq: Every morning | ORAL | Status: DC
  Administered 2021-12-01 – 2021-12-05 (×5): 5 mg via ORAL
  Filled 2021-12-01 (×5): qty 1

## 2021-12-01 MED ORDER — POLYETHYLENE GLYCOL 3350 17 G PO PACK
17.0000 g | PACK | Freq: Every day | ORAL | Status: DC
  Administered 2021-12-02 – 2021-12-05 (×4): 17 g via ORAL
  Filled 2021-12-01 (×5): qty 1

## 2021-12-01 MED ORDER — CLONIDINE HCL 0.1 MG PO TABS
0.1000 mg | ORAL_TABLET | Freq: Once | ORAL | Status: AC
  Administered 2021-12-01: 0.1 mg via ORAL
  Filled 2021-12-01: qty 1

## 2021-12-01 NOTE — Progress Notes (Signed)
I called and spoke with the patient's spouse [name]. We reviewed the imaging and the rationale to proceed with radiotherapy.  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  We reviewed the rationale for proceeding with radiotherapy to his chest especially given that he has had episodes of needing oxygen in the last 24 hours.  This will hopefully alleviate the pressure against his vessels in the mediastinum and improve some of his shortness of breath.  We also discussed the rationale for whole brain radiation, we reviewed the risks, benefits, short and long-term effects of radiotherapy including changes of cognition and the rationale for utilizing Namenda per NCCN guidelines to try and reduce long-term risks of cognitive disruption.  She is in agreement and will share this with the patient.  We will start his chest radiation today, simulate for his whole brain radiation today and begin the radiation for his brain tomorrow.  She is aware that we would try and complete treatment in 10 fractions to both sites and that Dr. Alvy Bimler would likely recommend initiating chemotherapy immediately concluding radiation.  A new prescription is also sent into his pharmacy in the outpatient setting for [place].     Carola Rhine, PAC

## 2021-12-01 NOTE — Progress Notes (Signed)
  Radiation Oncology         [phone] ________________________________  Name: Teigan Sahli MRN: [id number]  Date: 12/01/2021  DOB: [birth year]  SIMULATION AND TREATMENT PLANNING NOTE    ICD-10-CM   1. 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  Identity was confirmed.  All relevant records and images related to the planned course of therapy were reviewed.  The patient freely provided informed written consent to proceed with treatment after reviewing the details related to the planned course of therapy. The consent form was witnessed and verified by the simulation staff.  Then, the patient was set-up in a stable reproducible  supine position for radiation therapy.  CT images were obtained.  Surface markings were placed.  The CT images were loaded into the planning software.  Then the target and avoidance structures were contoured.  Treatment planning then occurred.  The radiation prescription was entered and confirmed.  Then, I designed and supervised the construction of a total of 3 medically necessary complex treatment device including a custom made thermoplastic mask used for immobilization, and two MLC collimator apertures for radiotherapy from the right and left side, with independent collimation for each to account for beam divergence.  I have requested : Isodose Plan.    PLAN:  The whole brain will be treated to 30 Gy in 10 fractions.  ________________________________  Sheral Apley Tammi Klippel, M.D.

## 2021-12-01 NOTE — Progress Notes (Signed)
   12/01/21 1600  Clinical Encounter Type  Visited With Health care provider  Visit Type Initial;Psychological support;Spiritual support;Social support  Referral From Nurse  Consult/Referral To Chaplain  Spiritual Encounters  Spiritual Needs Prayer;Emotional  Stress Factors  Patient Stress Factors None identified  Family Stress Factors None identified   Chaplain met with Staff RN Neoma Laming and provided Advanced Directives paperwork to be filled out by family in the morning.  Will follow up.

## 2021-12-01 NOTE — Plan of Care (Signed)
Pt reminded to utilize call bell to get assist to ambulate.  Wife at bedside.  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  Pt c/o headache - tylenol given.  Benedryl given for residual allergic reaction. Problem: Education: Goal: Knowledge of General Education information will improve Description: Including pain rating scale, medication(s)/side effects and non-pharmacologic comfort measures Outcome: Progressing   Problem: Health Behavior/Discharge Planning: Goal: Ability to manage health-related needs will improve Outcome: Progressing   Problem: Clinical Measurements: Goal: Ability to maintain clinical measurements within normal limits will improve Outcome: Progressing Goal: Will remain free from infection Outcome: Progressing Goal: Diagnostic test results will improve Outcome: Progressing Goal: Respiratory complications will improve Outcome: Progressing Goal: Cardiovascular complication will be avoided Outcome: Progressing   Problem: Activity: Goal: Risk for activity intolerance will decrease Outcome: Progressing   Problem: Nutrition: Goal: Adequate nutrition will be maintained Outcome: Progressing   Problem: Coping: Goal: Level of anxiety will decrease Outcome: Progressing   Problem: Elimination: Goal: Will not experience complications related to bowel motility Outcome: Progressing Goal: Will not experience complications related to urinary retention Outcome: Progressing   Problem: Pain Managment: Goal: General experience of comfort will improve Outcome: Progressing   Problem: Safety: Goal: Ability to remain free from injury will improve Outcome: Progressing   Problem: Skin Integrity: Goal: Risk for impaired skin integrity will decrease Outcome: Progressing

## 2021-12-01 NOTE — Progress Notes (Addendum)
HEMATOLOGY-ONCOLOGY PROGRESS NOTE  I saw him and edited the documentation as follows ASSESSMENT & PLAN Lung mass, brain mass, adrenal lesions concerning for metastatic malignancy, likely lung cancer until proven otherwise Imaging findings discussed with the patient and his significant other We discussed findings are concerning for metastatic/stage IV lung cancer We discussed need for biopsy to confirm the diagnosis and to determine the specific type of lung cancer We had initial discussions regarding treatment which would consist of radiation to the brain and possibly the chest due to impending SVC syndrome and chemotherapy to treat systemic disease, however, awaiting biopsy to guide further discussion regarding chemo treatment Status post CT-guided biopsy of his abdominal wall mass and Port-A-Cath placement on 11/28/2021 -biopsy results pending Has been seen by radiation oncology and underwent CT simulation 11/28/2021 Continue dexamethasone He can benefit from outpatient PET/CT imaging We will follow-up on biopsy results once available. 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 Sodium has dropped again this morning, hospitalist plans to recheck to confirm results Monitor I explained to the patient the rationale of not discharging him until his sodium is consistently stable over 130 due to high risk of arrhythmia and seizures   History of prostate cancer Status post prostatectomy with normal PSA in October 2022 He is followed as an outpatient by his primary care provider/urology   Diabetes mellitus On sliding scale insulin Management per hospitalist  Constipation Will change Colace from as needed to 100 mg twice daily and add MiraLAX daily   Goals of care discussion Discussed with the patient and his significant other that he likely has stage  IV lung cancer which is not curable Goal of care is to extend life   Discharge plan Patient has asked to go home multiple times but advised patient that he should stay in the hospital to expedite work-up and for his safety due to high risk of seizures with significant hyponatremia Patient was told that he needed to stay in the hospital until sodium improved, biopsy obtained, Port-A-Cath placed, and for him to be seen by radiation oncology I will return to see him in the next few days once result is available If he is discharged then, I will arrange for outpatient follow-up to discuss biopsy report  [name], MD  SUBJECTIVE: Sitting up in the recliner this a.m. Significant other at the bedside Voice is more hoarse this morning States he had an allergic reaction over the weekend to oxycodone Reports rash to his arms Has constipation  REVIEW OF SYSTEMS:   Constitutional: Denies fevers, chills  Eyes: Denies blurriness of vision Ears, nose, mouth, throat, and face: Denies mucositis or sore throat Respiratory: Positive for cough Cardiovascular: Denies palpitation, chest discomfort Gastrointestinal:  Denies nausea, heartburn or change in bowel habits Skin: Denies abnormal skin rashes Lymphatics: Denies new lymphadenopathy or easy bruising Neurological:Denies numbness, tingling or new weaknesses Behavioral/Psych: Mood is stable, no new changes  Extremities: No lower extremity edema All other systems were reviewed with the patient and are negative.  I have reviewed the past medical history, past surgical history, social history and family history with the patient and they are unchanged from previous note.   PHYSICAL EXAMINATION: ECOG PERFORMANCE STATUS: 2 - Symptomatic, <50% confined to bed  Vitals:   12/01/21 0600 12/01/21 0800  BP: (!) 161/67   Pulse: 89   Resp: (!) 23  Temp:  97.7 F (36.5 C)  SpO2: 95%    Filed Weights   11/26/21 0924  Weight: 106.6 kg     Intake/Output from previous day: 12/11 0701 - 12/12 0700 In: 130 [P.O.:100; I.V.:30] Out: -   GENERAL:alert, no distress and comfortable SKIN: Faint rash to his arms EYES: normal, Conjunctiva are pink and non-injected, sclera clear OROPHARYNX:no exudate, no erythema and lips, buccal mucosa, and tongue normal  LUNGS: clear to auscultation and percussion with normal breathing effort HEART: regular rate & rhythm and no murmurs and no lower extremity edema ABDOMEN:abdomen soft, non-tender and normal bowel sounds  NEURO: alert & oriented x 3 with fluent speech, no focal motor/sensory deficits  LABORATORY DATA:  I have reviewed the data as listed CMP Latest Ref Rng & Units 12/01/2021 11/30/2021 11/29/2021  Glucose 70 - 99 mg/dL 155(H) 163(H) 140(H)  BUN 8 - 23 mg/dL 16 16 19   Creatinine 0.61 - 1.24 mg/dL 0.45(L) 0.48(L) 0.53(L)  Sodium 135 - 145 mmol/L 121(L) 126(L) 124(L)  Potassium 3.5 - 5.1 mmol/L 4.2 4.0 4.2  Chloride 98 - 111 mmol/L 90(L) 92(L) 91(L)  CO2 22 - 32 mmol/L 19(L) 23 21(L)  Calcium 8.9 - 10.3 mg/dL 9.2 9.2 9.0  Total Protein 6.5 - 8.1 g/dL - - -  Total Bilirubin 0.3 - 1.2 mg/dL - - -  Alkaline Phos 38 - 126 U/L - - -  AST 15 - 41 U/L - - -  ALT 0 - 44 U/L - - -    Lab Results  Component Value Date   WBC 6.7 11/29/2021   HGB 12.2 (L) 11/29/2021   HCT 37.0 (L) 11/29/2021   MCV 95.1 11/29/2021   PLT 231 11/29/2021   NEUTROABS 7.2 11/26/2021    CT Head Wo Contrast  Result Date: 11/26/2021 CLINICAL DATA:  Dizziness and disequilibrium. Symptoms for 1 month. COVID 3 weeks ago. Prostate cancer with new lung mass. EXAM: CT HEAD WITHOUT CONTRAST TECHNIQUE: Contiguous axial images were obtained from the base of the skull through the vertex without intravenous contrast. COMPARISON:  None. FINDINGS: Brain: A midline extra-axial hyperdense mass lesion surrounds the superior sagittal sinus. The lesion measures 5.9 x 3.5 x 4.4 cm. An area of hypoattenuation is present in  the posterior right frontal lobe adjacent to the lesion. There is mass effect on the posterior frontal and parietal lobes bilaterally. Separate hyperdense focus is present in the cortical or subcortical left parietal lobe on axial image 24 of series 5. Similar subcortical hyperdensity is present in the left occipital lobe on axial image 17 of series 5. Areas of hypoattenuation are present in the cerebellum. The largest is in midline with some surrounding hyperdensity, also concerning for a metastatic lesion. It measures 2.2 x 2.1 cm on saddle sagittal image 29 of series 4. Smaller hypodense left cystic lesions the cerebellum are also concerning metastases. Infarct is considered less likely. A hypodense lesion in the right temporal lobe measures 11 mm. The ventricles are of normal size.  No significant fluid is present Vascular: No hyperdense vessel or unexpected calcification. Skull: Calvarium is intact. No focal lytic or blastic lesions are present. No significant extracranial soft tissue lesion is present. Sinuses/Orbits: Right maxillary sinus opacification is likely chronic. There is some wall thickening. Minimal air is present in the sinus. The paranasal sinuses and mastoid air cells are otherwise clear. The globes and orbits are within normal limits. IMPRESSION: 1. 5.9 x 3.5 x 4.4 cm midline extra-axial hyperdense mass lesion surrounds  the superior sagittal sinus. This most likely represents a metastasis. Meningioma is considered less likely. 2. Other hyperdense lesions are present in the cortical or subcortical left parietal lobe, left occipital lobe, and right temporal lobe concerning for metastatic disease. 3. There is mass effect on the posterior frontal and parietal lobes bilaterally. 4. Recommend MRI the brain without and contrast for further evaluation of these lesions. 5. Chronic right maxillary sinus disease. These results were called by telephone at the time of interpretation on 11/26/2021 at 1:30 pm  to provider Dr. Melina Copa, who verbally acknowledged these results. Electronically Signed   By: San Morelle M.D.   On: 11/26/2021 13:46   MR Brain W and Wo Contrast  Result Date: 11/28/2021 CLINICAL DATA:  Initial evaluation for brain mass. EXAM: MRI HEAD WITHOUT AND WITH CONTRAST MRV HEAD WITHOUT CONTRAST TECHNIQUE: Multiplanar, multiecho pulse sequences of the brain and surrounding structures were obtained without and with intravenous contrast. Angiographic images of the intracranial venous structures were obtained using MRV technique without intravenous contrast. CONTRAST:  34mL GADAVIST GADOBUTROL 1 MMOL/ML IV SOLN COMPARISON:  Prior CT from 11/26/2021. FINDINGS: MRI HEAD FINDINGS: Brain: Cerebral volume within normal limits. Multiple scattered intraparenchymal lesions are seen involving both cerebral hemispheres as well as the cerebellum, consistent with widespread metastatic disease. There are at least 20 lesions. For reference purposes, the largest lesion within the right cerebral hemisphere is present at the right temporal lobe in measures 1.5 x 1.2 x 0.7 cm (series 26, image 22). The largest lesion within the left cerebral hemisphere is positioned at the left occipital pole and measures 1.5 x 1.7 x 1.7 cm (series 26, image 21). Largest distinct infratentorial lesion is seen within the central cerebellum and measures 2.1 x 2.2 x 2.5 cm (series 26, image 12). Several of these lesions are somewhat cystic in appearance. Few lesions demonstrate intrinsic T1 hyperintensity and/or SWI signal, consistent with hemorrhage and/or blood products, most notable at the central cerebellum (series 11, image 11). Mild localized edema about several of these lesions without significant regional mass effect. An additional large enhancing extra-axial mass positioned at the posterior aspect of the superior sagittal sinus measures 3.2 x 6.6 x 4.8 cm (series 28, image 13). Scattered areas of internal susceptibility  artifact consistent with blood products. Mild localized vasogenic edema without significant mass effect. This lesion is somewhat indeterminate, and could reflect an additional dural-based metastasis. Concomitant meningioma could also be considered. This lesion invades and obliterates the adjacent superior sagittal sinus, better evaluated on concomitant MRV. Mild diffuse smooth pachymeningeal thickening and enhancement is likely secondary. No definite evidence for acute or subacute ischemia. Note made of a punctate focus of diffusion abnormality at the right basal ganglia, favored to reflect a small metastasis rather than infarct (series 5, image 75). No midline shift or hydrocephalus. No other extra-axial fluid collection. Pituitary gland suprasellar region within normal limits. Vascular: Major intracranial arterial vascular flow voids are maintained. Skull and upper cervical spine: Craniocervical junction within normal limits. Heterogeneous signal intensity seen within the visualized bone marrow without focal marrow replacing lesion. No scalp soft tissue abnormality. Sinuses/Orbits: Globes within normal limits. Increased CSF signal intensity seen along the optic nerve sheaths bilaterally, suggesting elevated intracranial pressures. Orbital soft tissues otherwise unremarkable. Moderate mucosal thickening within the right maxillary sinus. Paranasal sinuses are otherwise largely clear. Small right with trace left mastoid effusions. Other: None. MRV HEAD FINDINGS: The above described dural based mass again noted at the posterior aspect of the superior sagittal  sinus. The mass invades and obliterates the superior sagittal sinus at this level (series 1, image 98). Tubular filling defect is seen within the superior sagittal sinus anterior to the mass, likely a small amount of nonocclusive thrombus (series 13, images 88, 102). Superior sagittal sinus otherwise patent anteriorly. Additional extension of the lesion to  involve the posterior aspect of the suture superior sagittal sinus as it courses towards the torcula (series 12, image 82) the torcula itself is patent. Transverse and sigmoid sinuses are patent as are the visualized proximal internal jugular veins. Straight sinus, vein of Galen, inferior sagittal sinus, internal cerebral veins, and basal veins of Rosenthal are patent. No abnormality about the cavernous sinus. Superior orbital veins symmetric and within normal limits. No appreciable cortical venous thrombosis. IMPRESSION: MRI HEAD IMPRESSION: 1. 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 Mild localized edema with hemorrhagic blood products about several of these lesions without significant regional mass effect or midline shift. 2. 3.2 x 6.6 x 4.8 cm enhancing extra-axial mass at the posterior aspect of the superior sagittal sinus, indeterminate. While this finding could reflect an additional dural-based metastasis, a possible concomitant meningioma could also be considered. MRV HEAD IMPRESSION: 1. Invasion and obliteration of the posterior superior sagittal sinus by the above described dural based mass. Small amount of additional nonocclusive thrombus within the superior sagittal sinus anterior to the mass as above. 2. Remainder of the major dural sinuses are otherwise patent. Electronically Signed   By: Jeannine Boga M.D.   On: 11/28/2021 00:09   MR Venogram Head  Result Date: 11/28/2021 CLINICAL DATA:  Initial evaluation for brain mass. EXAM: MRI HEAD WITHOUT AND WITH CONTRAST MRV HEAD WITHOUT CONTRAST TECHNIQUE: Multiplanar, multiecho pulse sequences of the brain and surrounding structures were obtained without and with intravenous contrast. Angiographic images of the intracranial venous structures were obtained using MRV technique without intravenous contrast. CONTRAST:  63mL GADAVIST GADOBUTROL 1 MMOL/ML IV SOLN COMPARISON:  Prior CT from  11/26/2021. FINDINGS: MRI HEAD FINDINGS: Brain: Cerebral volume within normal limits. Multiple scattered intraparenchymal lesions are seen involving both cerebral hemispheres as well as the cerebellum, consistent with widespread metastatic disease. There are at least 20 lesions. For reference purposes, the largest lesion within the right cerebral hemisphere is present at the right temporal lobe in measures 1.5 x 1.2 x 0.7 cm (series 26, image 22). The largest lesion within the left cerebral hemisphere is positioned at the left occipital pole and measures 1.5 x 1.7 x 1.7 cm (series 26, image 21). Largest distinct infratentorial lesion is seen within the central cerebellum and measures 2.1 x 2.2 x 2.5 cm (series 26, image 12). Several of these lesions are somewhat cystic in appearance. Few lesions demonstrate intrinsic T1 hyperintensity and/or SWI signal, consistent with hemorrhage and/or blood products, most notable at the central cerebellum (series 11, image 11). Mild localized edema about several of these lesions without significant regional mass effect. An additional large enhancing extra-axial mass positioned at the posterior aspect of the superior sagittal sinus measures 3.2 x 6.6 x 4.8 cm (series 28, image 13). Scattered areas of internal susceptibility artifact consistent with blood products. Mild localized vasogenic edema without significant mass effect. This lesion is somewhat indeterminate, and could reflect an additional dural-based metastasis. Concomitant meningioma could also be considered. This lesion invades and obliterates the adjacent superior sagittal sinus, better evaluated on concomitant MRV. Mild diffuse smooth pachymeningeal thickening and enhancement is likely secondary. No definite evidence for acute or  subacute ischemia. Note made of a punctate focus of diffusion abnormality at the right basal ganglia, favored to reflect a small metastasis rather than infarct (series 5, image 75). No midline  shift or hydrocephalus. No other extra-axial fluid collection. Pituitary gland suprasellar region within normal limits. Vascular: Major intracranial arterial vascular flow voids are maintained. Skull and upper cervical spine: Craniocervical junction within normal limits. Heterogeneous signal intensity seen within the visualized bone marrow without focal marrow replacing lesion. No scalp soft tissue abnormality. Sinuses/Orbits: Globes within normal limits. Increased CSF signal intensity seen along the optic nerve sheaths bilaterally, suggesting elevated intracranial pressures. Orbital soft tissues otherwise unremarkable. Moderate mucosal thickening within the right maxillary sinus. Paranasal sinuses are otherwise largely clear. Small right with trace left mastoid effusions. Other: None. MRV HEAD FINDINGS: The above described dural based mass again noted at the posterior aspect of the superior sagittal sinus. The mass invades and obliterates the superior sagittal sinus at this level (series 1, image 98). Tubular filling defect is seen within the superior sagittal sinus anterior to the mass, likely a small amount of nonocclusive thrombus (series 13, images 88, 102). Superior sagittal sinus otherwise patent anteriorly. Additional extension of the lesion to involve the posterior aspect of the suture superior sagittal sinus as it courses towards the torcula (series 12, image 82) the torcula itself is patent. Transverse and sigmoid sinuses are patent as are the visualized proximal internal jugular veins. Straight sinus, vein of Galen, inferior sagittal sinus, internal cerebral veins, and basal veins of Rosenthal are patent. No abnormality about the cavernous sinus. Superior orbital veins symmetric and within normal limits. No appreciable cortical venous thrombosis. IMPRESSION: MRI HEAD IMPRESSION: 1. 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 Mild localized edema  with hemorrhagic blood products about several of these lesions without significant regional mass effect or midline shift. 2. 3.2 x 6.6 x 4.8 cm enhancing extra-axial mass at the posterior aspect of the superior sagittal sinus, indeterminate. While this finding could reflect an additional dural-based metastasis, a possible concomitant meningioma could also be considered. MRV HEAD IMPRESSION: 1. Invasion and obliteration of the posterior superior sagittal sinus by the above described dural based mass. Small amount of additional nonocclusive thrombus within the superior sagittal sinus anterior to the mass as above. 2. Remainder of the major dural sinuses are otherwise patent. Electronically Signed   By: Jeannine Boga M.D.   On: 11/28/2021 00:09   CT CHEST ABDOMEN PELVIS W CONTRAST  Result Date: 11/26/2021 CLINICAL DATA:  Persistent cough, shortness of breath EXAM: CT CHEST, ABDOMEN, AND PELVIS WITH CONTRAST TECHNIQUE: Multidetector CT imaging of the chest, abdomen and pelvis was performed following the standard protocol during bolus administration of intravenous contrast. CONTRAST:  167mL OMNIPAQUE IOHEXOL 300 MG/ML  SOLN COMPARISON:  Previous studies including chest radiographs done on 11/24/2021 FINDINGS: CT CHEST FINDINGS Cardiovascular: Coronary artery calcifications are seen. There is homogeneous enhancement in thoracic aorta. There are no intraluminal filling defects in central pulmonary artery branches. There is extrinsic compression of right main pulmonary artery caused by pathologically enlarged lymph nodes in mediastinum and right hilum. There is extrinsic pressure over the superior vena cava caused by lymphadenopathy. Mediastinum/Nodes: There is bulky lymphadenopathy in the mediastinum extending to the right hilum measuring approximately 8.6 x 8.6 x 9.5 cm. There is 2.6 x 1.9 cm node in the left hilum. Lungs/Pleura: Centrilobular emphysema is seen. In image 54 of series 4, there is 2.9 x 2.4 cm  pleural-based noncalcified  nodule in the right upper lobe. There are increased interstitial markings in the right upper lobe. In the image 71, there is 12 mm nodular density in the right upper lobe. In the image 73, there is 6 mm nodule in the right upper lobe. There are 3 small nodular densities each measuring less than 9 mm in size in the right lower lobe. Musculoskeletal: Unremarkable. CT ABDOMEN PELVIS FINDINGS Hepatobiliary: Liver measures 17.5 cm in length. In the image 58 of series 2, there is 1.6 cm fluid density lesion in the left lobe, possibly cyst. There are other scattered low-density foci in the right lobe some of which may be cysts. Possibility of space-occupying neoplastic process is not excluded. There are slightly enlarged lymph nodes anterior to the liver and adjacent to the pericardium largest measuring 2.7 x 1.7 cm. There is no dilation of bile ducts. Gallbladder is not distended. Pancreas: Head of the pancreas is prominent in size. There is no dilation of pancreatic duct. No focal abnormality is seen in the body and tail of pancreas. Spleen: Spleen is not enlarged. Adrenals/Urinary Tract: There is 3 cm nodule in the left adrenal. There is 2 cm nodule in the right adrenal. There is no hydronephrosis. There are no renal or ureteral stones. There are smooth marginated nodular densities adjacent to the kidneys largest measuring 3.6 cm posterior to the upper pole of right kidney. Density measurements in this lesions are higher than usual for simple cysts. Ureters not dilated. Urinary bladder is not distended. Stomach/Bowel: Stomach is unremarkable. Small bowel loops are not dilated. Appendix is not dilated. There is no significant wall thickening in colon. Vascular/Lymphatic: There are scattered atherosclerotic plaques and calcifications in the aorta and its major branches. There are pathologically enlarged lymph nodes in the mesentery and retroperitoneum. Largest of these nodes is noted anterior to  the transverse colon measuring 5.9 x 3 cm. There are multiple other smaller nodules of varying sizes in the abdomen in the mesentery and retroperitoneum. Reproductive: Prostate is not enlarged. There is fluid density in the the region of prostatic urethra. This may suggest previous prostate surgery. Other: In image 76 of series 2, there is a 12 mm subcutaneous nodule in the right anterior abdominal wall. Small bilateral inguinal hernias containing fat are seen. Musculoskeletal: Unremarkable. IMPRESSION: There is 2.9 cm lobulated noncalcified pleural-based nodule in the right upper lobe suggesting possible primary malignant neoplasm. There are pathologically enlarged bulky lymph nodes in the mediastinum and both hilar regions, more so on the right side suggesting metastatic lymphadenopathy. There is extrinsic pressure over the right main pulmonary artery and superior vena cava by the enlarged lymph nodes in mediastinum. Increased interstitial markings and small scattered nodules are seen in the right lung which may be part of pneumonia or pulmonary metastatic disease. There are nodular densities in both adrenals largest measuring 3 cm in the left adrenal suggesting possible metastatic disease. There are enlarged lymph nodes in the retroperitoneum and mesentery largest measuring 5.9 cm in maximum diameter suggesting metastatic lymphadenopathy. There are multiple cysts in the liver. There are other indeterminate low-density lesions of varying sizes in the liver. Possibility of hepatic metastatic disease is not excluded. Head of the pancreas is enlarged in size with lobulated margins. Possibility of neoplastic process in the head of the pancreas is not excluded. Follow-up PET-CT and biopsy as warranted should be considered. Coronary artery calcifications are seen. Other findings as described in the body of the report. Electronically Signed   By: Royston Cowper  Rathinasamy M.D.   On: 11/26/2021 13:49   CT BIOPSY  Result  Date: 11/28/2021 INDICATION: 65 year old with evidence of diffuse metastatic disease. Tissue diagnosis is needed. EXAM: CT-GUIDED BIOPSY OF ANTERIOR ABDOMINAL PERITONEAL MASS MEDICATIONS: Moderate sedation ANESTHESIA/SEDATION: Moderate (conscious) sedation was employed during this procedure. A total of Versed 2.0mg  and fentanyl 100 mcg was administered intravenously at the order of the provider performing the procedure. Total intra-service moderate sedation time: 14 minutes. Patient's level of consciousness and vital signs were monitored continuously by radiology nurse throughout the procedure under the supervision of the provider performing the procedure. FLUOROSCOPY TIME:  None COMPLICATIONS: None immediate. PROCEDURE: Informed written consent was obtained from the patient after a thorough discussion of the procedural risks, benefits and alternatives. All questions were addressed. A timeout was performed prior to the initiation of the procedure. Patient was placed supine on CT scanner. Images of the upper abdomen are obtained. The large mass in the anterior upper abdomen was identified and targeted. The skin was prepped with chlorhexidine and sterile field was created. Skin and soft tissues were anesthetized with 1% lidocaine. A small incision was made. Using CT guidance, 17 gauge coaxial needle was directed into the anterior abdominal mass. Multiple core biopsies were obtained with an 18 gauge core device and specimens were placed in formalin. 17 gauge needle was removed without complication. Bandage placed over the puncture site. FINDINGS: Again noted is large mass in the anterior upper abdomen adjacent to the abdominal wall. This structure measures up to 5.1 cm. Needle position was confirmed within the lesion. Specimens placed in formalin. IMPRESSION: CT-guided core biopsies of the anterior upper abdominal mass. Electronically Signed   By: Markus Daft M.D.   On: 11/28/2021 12:50   DG CHEST PORT 1  VIEW  Result Date: 11/30/2021 CLINICAL DATA:  Cough, prostate cancer, COVID EXAM: PORTABLE CHEST 1 VIEW COMPARISON:  None. FINDINGS: Right Port-A-Cath tip at the cavoatrial junction. Heart is normal size. Left lung clear. Right hilar and mediastinal adenopathy again noted. Peripheral right upper lobe nodule/mass again noted. No effusions. No acute bony abnormality. IMPRESSION: Right upper lobe mass with right hilar and mediastinal adenopathy. Electronically Signed   By: Rolm Baptise M.D.   On: 11/30/2021 17:32   IR IMAGING GUIDED PORT INSERTION  Result Date: 11/28/2021 INDICATION: 65 year old with evidence of metastatic disease. Port-A-Cath needed for therapy. EXAM: FLUOROSCOPIC AND ULTRASOUND GUIDED PLACEMENT OF A SUBCUTANEOUS PORT COMPARISON:  None. MEDICATIONS: Moderate sedation ANESTHESIA/SEDATION: Versed 2.0 mg IV; Fentanyl 100 mcg IV; Moderate Sedation Time:  44 minutes The patient was continuously monitored during the procedure by the interventional radiology nurse under my direct supervision. FLUOROSCOPY TIME:  24 seconds, 1 mGy COMPLICATIONS: None immediate. PROCEDURE: The procedure, risks, benefits, and alternatives were explained to the patient. Questions regarding the procedure were encouraged and answered. The patient understands and consents to the procedure. Patient was placed supine on the interventional table. Ultrasound confirmed a patent right internal jugular vein. Ultrasound image was saved for documentation. The right chest and neck were cleaned with a skin antiseptic and a sterile drape was placed. Maximal barrier sterile technique was utilized including caps, mask, sterile gowns, sterile gloves, sterile drape, hand hygiene and skin antiseptic. The right neck was anesthetized with 1% lidocaine. Small incision was made in the right neck with a blade. Micropuncture set was placed in the right internal jugular vein with ultrasound guidance. The micropuncture wire was used for measurement  purposes. The right chest was anesthetized with 1% lidocaine with  epinephrine. #15 blade was used to make an incision and a subcutaneous port pocket was formed. [place] was assembled. Subcutaneous tunnel was formed with a stiff tunneling device. The port catheter was brought through the subcutaneous tunnel. The port was placed in the subcutaneous pocket. The micropuncture set was exchanged for a peel-away sheath. The catheter was placed through the peel-away sheath and the tip was positioned at the superior cavoatrial junction. Catheter placement was confirmed with fluoroscopy. The port was accessed and flushed with heparinized saline. The port pocket was closed using two layers of absorbable sutures and Dermabond. The vein skin site was closed using a single layer of absorbable suture and Dermabond. Sterile dressings were applied. Patient tolerated the procedure well without an immediate complication. Ultrasound and fluoroscopic images were taken and saved for this procedure. IMPRESSION: Placement of a subcutaneous power-injectable port device. Catheter tip at the superior cavoatrial junction. Electronically Signed   By: Markus Daft M.D.   On: 11/28/2021 17:48     Future Appointments  Date Time Provider [place]  12/08/2021 12:00 PM [facility] 4 [facility] None  12/09/2021 12:45 PM [facility] 3 [facility] None  12/10/2021  2:15 PM [facility] LIN[facility] 3 [facility] None  12/11/2021  2:00 PM [facility] LIN[facility] 3 [facility] None  12/12/2021  2:00 PM [facility] LIN[facility] 3 [facility] None  12/16/2021  1:45 PM [facility] LIN[facility] 3 [facility] None  12/17/2021  1:45 PM [facility] LIN[facility] 3 [facility] None  12/18/2021  1:45 PM [facility] LIN[facility] 3 [facility] None  12/19/2021  1:45 PM [facility] LINAC 3 [facility] None  12/23/2021  1:45 PM [facility] LIN[facility] 3 [facility] None      LOS: 5 days

## 2021-12-01 NOTE — Progress Notes (Signed)
PROGRESS NOTE    Ryan [name]  ZOX:[id number] DOB: 16-Oct-1956 DOA: 11/26/2021  PCP: [name], DO   Brief Narrative:  This 65 years old male with PMH significant for prostate cancer, type 2 diabetes presented in the ED with 3 weeks history of progressive weakness, decreased appetite, 20 pound weight loss.  Patient also been experiencing episodes of severe dizziness and disequilibrium. He reports he was diagnosed with COVID 3 weeks ago.  Work-up in the ED shows sodium 118.  CT chest showed 2.9 cm lobulated noncalcified pleural-based nodule in the right upper lobe suggesting possible primary malignant neoplasm.  There is multiple lesions noted in the brain, adrenal gland and nearby lymph nodes suggesting metastasis. 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  Sodium is slowly improving with fluid restriction.  Oncology is consulted recommended dexamethasone, MRI brain confirms mets.  Patient underwent CT-guided biopsy of abdominal peritoneal mass.  Patient also need Port-A-Cath placement for chemotherapy. Patient is started radiation treatment.  Plan is to start chemotherapy once completes radiation treatment.  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 Sodium slowly improving with fluid restriction. Continue to monitor 118> 117> 120> 121> 123> 124> 125>124 >126 >121. Patient gets frustrated and wants to be discharged.  We explained the rationale for not discharging until sodium gets within 130 range.   Explained to the patient that he is at risk for seizures or life-threatening arrhythmias.  He understood.  Metastatic lung CA with mets in the brain, adrenal gland: Lung nodule and multiple  lesions in brain, adrenal gland and nearby lymph node concerning for metastasis with primary lung carcinoma until proven otherwise. IR consulted for biopsy of peritoneal lesion. Oncology is consulted,  recommended to continue dexamethasone. Oncology also recommended radiation oncology consult for radiation to the brain and possibly chest due to impending SVC syndrome. MRI brain confirms the metastatic lesions in the brain.Invasion and obliteration of the posterior superior sagittal sinus by the above described dural based mass. Patient underwent successful CT-guided biopsy of the abdominal mass, he also underwent Port-A-Cath placement by IR on 12/9. Patient started radiation treatment on 12/12.  Oncology is following.  Plan is to start chemotherapy once he completes radiation. Patient will need outpatient PET/CT imaging.  History of prostate cancer: S/p prostatectomy with normal PSA.  Diabetes mellitus: Continue regular insulin sliding scale.  Essential hypertension: Hold lisinopril. Increase amlodipine 10 mg daily.  Constipation: Continue senna and Colace. MiraLAX daily.  Goals of care discussion.: Patient does have stage IV lung cancer with metastasis. Patient needs goals of care discussion about prognosis.  DVT prophylaxis:  SCDs Code Status: Full code. Family Communication: Wife in the room. Disposition Plan:  Status is: Inpatient  Remains inpatient appropriate because:  Hyponatremia, metastatic lung cancer requiring complete work-up,  underwent biopsy of the peritoneal mass. Port-A-Cath placement, Patient started radiation therapy to the brain and chest on 12/12.  Anticipated discharge home once sodium improves above 130.  Consultants:  Oncology, radiation oncology  Procedures: CT guided biopsy of the peritoneal mass.  Port a cath placement  Antimicrobials:   Anti-infectives (From admission, onward)    None       Subjective: Patient was seen  and examined at  bedside.  Overnight events noted.   Patient seems comfortable,  states he understood the rationale for staying long in the hospital. Patient started radiation treatment to the brain and chest today.  Tolerated well. He has developed an allergic reaction to Percocet given couple of times which got better with Benadryl.  Objective: Vitals:   12/01/21 0500 12/01/21 0600 12/01/21 0800 12/01/21 0900  BP: (!) 156/82 (!) 161/67 (!) 170/77 (!) 158/109  Pulse: 80 89 90   Resp: 14 (!) 23 17 19   Temp:   97.7 F (36.5 C)   TempSrc:   Oral   SpO2: 98% 95% 97%   Weight:      Height:        Intake/Output Summary (Last 24 hours) at 12/01/2021 1016 Last data filed at 11/30/2021 2118 Gross per 24 hour  Intake 120 ml  Output --  Net 120 ml   Filed Weights   11/26/21 0924  Weight: 106.6 kg    Examination:  General exam: Appears anxious, restless, deconditioned, not in any distress.  Respiratory system: Clear to auscultation bilaterally. Respiratory effort normal. RR15 Cardiovascular system: S1-S2 heard, regular rate and rhythm, no murmur. Gastrointestinal system: Abdomen is soft, nontender, nondistended, BS +. Central nervous system: Alert and oriented x 3. No focal neurological deficits. Extremities: No edema, no cyanosis, no clubbing. Skin: No rashes, lesions or ulcers Psychiatry: Judgement and insight appear normal. Mood & affect appropriate.     Data Reviewed: I have personally reviewed following labs and imaging studies  CBC: Recent Labs  Lab 11/26/21 1130 11/29/21 0325  WBC 8.7 6.7  NEUTROABS 7.2  --   HGB 12.0* 12.2*  HCT 34.3* 37.0*  MCV 90.5 95.1  PLT 247 785   Basic Metabolic Panel: Recent Labs  Lab 11/26/21 1130 11/26/21 1329 11/27/21 1830 11/28/21 0023 11/29/21 0325 11/30/21 0330 12/01/21 0314  NA 118*   < > 124* 125* 124* 126* 121*  K 4.2   < > 4.7 4.1 4.2 4.0 4.2  CL 86*   < > 90* 92* 91* 92* 90*  CO2 23   < > 25 24 21* 23 19*  GLUCOSE 119*   < > 151*  126* 140* 163* 155*  BUN 12   < > 16 17 19 16 16   CREATININE 0.43*   < > 0.61 0.53* 0.53* 0.48* 0.45*  CALCIUM 8.8*   < > 9.4 9.3 9.0 9.2 9.2  MG 1.6*  --   --   --  2.0  --  1.9  PHOS  --   --   --   --  3.2  --  3.4   < > = values in this interval not displayed.   GFR: Estimated Creatinine Clearance: 116.6 mL/min (A) (by C-G formula based on SCr of 0.45 mg/dL (L)). Liver Function Tests: Recent Labs  Lab 11/26/21 1130  AST 27  ALT 15  ALKPHOS 69  BILITOT 0.6  PROT 7.1  ALBUMIN 3.7   Recent Labs  Lab 11/26/21 1130  LIPASE 114*   No results for input(s): AMMONIA in the last 168 hours. Coagulation Profile: Recent Labs  Lab 11/28/21 0023  INR 0.9   Cardiac Enzymes: No results for input(s): CKTOTAL, CKMB, CKMBINDEX, TROPONINI in the last 168 hours. BNP (last 3 results) No results for input(s): PROBNP in the last 8760 hours. HbA1C: No results for input(s): HGBA1C in the last 72 hours.  CBG: Recent Labs  Lab 11/30/21 0809 11/30/21 1134 11/30/21  1634 11/30/21 2123 12/01/21 0748  GLUCAP 145* 221* 178* 162* 143*   Lipid Profile: No results for input(s): CHOL, HDL, LDLCALC, TRIG, CHOLHDL, LDLDIRECT in the last 72 hours. Thyroid Function Tests: No results for input(s): TSH, T4TOTAL, FREET4, T3FREE, THYROIDAB in the last 72 hours. Anemia Panel: No results for input(s): VITAMINB12, FOLATE, FERRITIN, TIBC, IRON, RETICCTPCT in the last 72 hours. Sepsis Labs: No results for input(s): PROCALCITON, LATICACIDVEN in the last 168 hours.  Recent Results (from the past 240 hour(s))  Resp Panel by RT-PCR (Flu A&B, Covid) Nasopharyngeal Swab     Status: Abnormal   Collection Time: 11/26/21  4:04 PM   Specimen: Nasopharyngeal Swab; Nasopharyngeal(NP) swabs in vial transport medium  Result Value Ref Range Status   SARS Coronavirus 2 by RT PCR POSITIVE (A) NEGATIVE Final    Comment: RESULT CALLED TO, READ BACK BY AND VERIFIED WITH: JAMIE BAILEY RN @1646  11/26/2021  OLSONM (NOTE) SARS-CoV-2 target nucleic acids are DETECTED.  The SARS-CoV-2 RNA is generally detectable in upper respiratory specimens during the acute phase of infection. Positive results are indicative of the presence of the identified virus, but do not rule out bacterial infection or co-infection with other pathogens not detected by the test. Clinical correlation with patient history and other diagnostic information is necessary to determine patient infection status. The expected result is Negative.  Fact Sheet for Patients: EntrepreneurPulse.com.au  Fact Sheet for Healthcare Providers: IncredibleEmployment.be  This test is not yet approved or cleared by the [place] FDA and  has been authorized for detection and/or diagnosis of SARS-CoV-2 by FDA under an Emergency Use Authorization (EUA).  This EUA will remain in effect (meaning this test  can be used) for the duration of  the COVID-19 declaration under Section 564(b)(1) of the Act, 21 U.S.C. section 360bbb-3(b)(1), unless the authorization is terminated or revoked sooner.     Influenza A by PCR NEGATIVE NEGATIVE Final   Influenza B by PCR NEGATIVE NEGATIVE Final    Comment: (NOTE) The Xpert Xpress SARS-CoV-2/FLU/RSV plus assay is intended as an aid in the diagnosis of influenza from Nasopharyngeal swab specimens and should not be used as a sole basis for treatment. Nasal washings and aspirates are unacceptable for Xpert Xpress SARS-CoV-2/FLU/RSV testing.  Fact Sheet for Patients: EntrepreneurPulse.com.au  Fact Sheet for Healthcare Providers: IncredibleEmployment.be  This test is not yet approved or cleared by the [place] FDA and has been authorized for detection and/or diagnosis of SARS-CoV-2 by FDA under an Emergency Use Authorization (EUA). This EUA will remain in effect (meaning this test can be used) for the duration of the COVID-19  declaration under Section 564(b)(1) of the Act, 21 U.S.C. section 360bbb-3(b)(1), unless the authorization is terminated or revoked.  Performed at [facility], [address] [place], [place] [address]   MRSA Next Gen by PCR, Nasal     Status: None   Collection Time: 11/26/21 11:44 PM   Specimen: Nasal Mucosa; Nasal Swab  Result Value Ref Range Status   MRSA by PCR Next Gen NOT DETECTED NOT DETECTED Final    Comment: (NOTE) The GeneXpert MRSA Assay (FDA approved for NASAL specimens only), is one component of a comprehensive MRSA colonization surveillance program. It is not intended to diagnose MRSA infection nor to guide or monitor treatment for MRSA infections. Test performance is not FDA approved in patients less than 54 years old. Performed at [facility], [place] [address], [place], [place] [address]  Radiology Studies: DG CHEST PORT 1 VIEW  Result Date: 11/30/2021 CLINICAL DATA:  Cough, prostate cancer, COVID EXAM: PORTABLE CHEST 1 VIEW COMPARISON:  None. FINDINGS: Right Port-A-Cath tip at the cavoatrial junction. Heart is normal size. Left lung clear. Right hilar and mediastinal adenopathy again noted. Peripheral right upper lobe nodule/mass again noted. No effusions. No acute bony abnormality. IMPRESSION: Right upper lobe mass with right hilar and mediastinal adenopathy. Electronically Signed   By: Rolm Baptise M.D.   On: 11/30/2021 17:32     Scheduled Meds:  (feeding supplement) PROSource Plus  30 mL Oral Daily   [START ON 12/02/2021] amLODipine  10 mg Oral Daily   Chlorhexidine Gluconate Cloth  6 each Topical Daily   dexamethasone (DECADRON) injection  4 mg Intravenous Q8H   docusate sodium  100 mg Oral BID   feeding supplement  1 Container Oral Q24H   feeding supplement  237 mL Oral BID BM   insulin aspart  0-15 Units Subcutaneous TID WC   mouth rinse  15 mL Mouth Rinse BID   memantine  5 mg Oral q AM   multivitamin with minerals  1  tablet Oral Daily   nicotine  7 mg Transdermal Daily   pantoprazole  40 mg Oral Daily   polyethylene glycol  17 g Oral Daily   sodium chloride flush  10-40 mL Intracatheter Q12H   Continuous Infusions:   LOS: 5 days    Time spent: 25 mins    Shawna Clamp, MD [facility]   If 7PM-7AM, please contact night-coverage

## 2021-12-02 ENCOUNTER — Ambulatory Visit
Admit: 2021-12-02 | Discharge: 2021-12-02 | Disposition: A | Discharge status: Home / Self Care | Payer: BC Managed Care – PPO | Attending: Radiation Oncology | Admitting: Radiation Oncology

## 2021-12-02 LAB — SODIUM: Sodium: 126 mmol/L — ABNORMAL LOW (ref 135–145)

## 2021-12-02 LAB — SURGICAL PATHOLOGY

## 2021-12-02 LAB — HEPATIC FUNCTION PANEL
ALT: 31 U/L (ref 0–44)
AST: 28 U/L (ref 15–41)
Albumin: 3.6 g/dL (ref 3.5–5.0)
Alkaline Phosphatase: 65 U/L (ref 38–126)
Bilirubin, Direct: 0.2 mg/dL (ref 0.0–0.2)
Indirect Bilirubin: 0.7 mg/dL (ref 0.3–0.9)
Total Bilirubin: 0.9 mg/dL (ref 0.3–1.2)
Total Protein: 7.2 g/dL (ref 6.5–8.1)

## 2021-12-02 LAB — GLUCOSE, CAPILLARY
Glucose-Capillary: 168 mg/dL — ABNORMAL HIGH (ref 70–99)
Glucose-Capillary: 172 mg/dL — ABNORMAL HIGH (ref 70–99)
Glucose-Capillary: 175 mg/dL — ABNORMAL HIGH (ref 70–99)
Glucose-Capillary: 168 mg/dL — ABNORMAL HIGH (ref 70–99)

## 2021-12-02 LAB — BASIC METABOLIC PANEL
Anion gap: 11 (ref 5–15)
BUN: 16 mg/dL (ref 8–23)
CO2: 21 mmol/L — ABNORMAL LOW (ref 22–32)
Calcium: 8.9 mg/dL (ref 8.9–10.3)
Chloride: 89 mmol/L — ABNORMAL LOW (ref 98–111)
Creatinine, Ser: 0.47 mg/dL — ABNORMAL LOW (ref 0.61–1.24)
GFR, Estimated: >60 mL/min (ref >60)
Glucose, Bld: 167 mg/dL — ABNORMAL HIGH (ref 70–99)
Potassium: 3.9 mmol/L (ref 3.5–5.1)
Sodium: 121 mmol/L — ABNORMAL LOW (ref 135–145)

## 2021-12-02 LAB — CBC
HCT: 38 % — ABNORMAL LOW (ref 39.0–52.0)
Hemoglobin: 12.9 g/dL — ABNORMAL LOW (ref 13.0–17.0)
MCH: 31.1 pg (ref 26.0–34.0)
MCHC: 33.9 g/dL (ref 30.0–36.0)
MCV: 91.6 fL (ref 80.0–100.0)
Platelets: 186 10*3/uL (ref 150–400)
RBC: 4.15 MIL/uL — ABNORMAL LOW (ref 4.22–5.81)
RDW: 13.2 % (ref 11.5–15.5)
WBC: 15.5 10*3/uL — ABNORMAL HIGH (ref 4.0–10.5)
nRBC: 0 % (ref 0.0–0.2)

## 2021-12-02 MED ORDER — TOLVAPTAN 15 MG PO TABS
15.0000 mg | ORAL_TABLET | Freq: Once | ORAL | Status: AC
  Administered 2021-12-02: 15 mg via ORAL
  Filled 2021-12-02: qty 1

## 2021-12-02 MED ORDER — ENOXAPARIN SODIUM 40 MG/0.4ML IJ SOSY
40.0000 mg | PREFILLED_SYRINGE | Freq: Every day | INTRAMUSCULAR | Status: DC
  Administered 2021-12-02 – 2021-12-05 (×4): 40 mg via SUBCUTANEOUS
  Filled 2021-12-02 (×4): qty 0.4

## 2021-12-02 MED ORDER — MUSCLE RUB 10-15 % EX CREA
TOPICAL_CREAM | CUTANEOUS | Status: DC | PRN
  Administered 2021-12-02: 1 via TOPICAL
  Filled 2021-12-02: qty 85

## 2021-12-02 MED ORDER — PHENOL 1.4 % MT LIQD
1.0000 | OROMUCOSAL | Status: DC | PRN
  Administered 2021-12-02: 1 via OROMUCOSAL
  Filled 2021-12-02 (×2): qty 177

## 2021-12-02 NOTE — Consult Note (Signed)
Renal Service Consult Note [name]  Ryan [name] 12/02/2021 Ryan Blazing, MD Requesting Physician: Dr. Dwyane [name]  Reason for Consult: Hyponatremia HPI: The patient is a 65 y.o. year-old w/ hx of DM2, prostate cancer rx'd in the past (XRT, surgery). Presented to ED on 12/07 w/ wt loss 20 lbs and no appetite, gen'd weakness. Dx'd w/ COVID 19 about 3 wks prior. Had chronic cough prior to that. Difficulty walking, dizzy and out of balance. Some N/V as well for the last 2 weeks off and on. W/u showed low Na 118. CT head showed 5.9cm mass, CT body showed lung mass 2.9cm w/ bulky LAN in the mediastinum/ hilar regions w/ pressure on the SVC/ R main PA. Also bilat adrenal nodules/ masses, enlarged LN's in retroperitoneum mesentery, multiple LDL's in the liver, possible head of pancreas mass as well. Oncology concerned for stage IV lung cancer. Pt underwent biopsy of mesentery abdominal mass by IR on 12/09. XRT started here. Plan per ONC is for chemoRx when biopsy results are back. Asked to see for hyponatremia.   Pt seen in SDU room. Pt is pleasant, up in chair, family in the room. Pt c/o general tiredness, o/w no c/o's.    Na on admission 12/08 was 121, up to 126 peak on 12/11 and down to 121 today 12/13.  Pt has been on fluid restriction here.   ROS - denies CP, no joint pain, no HA, no blurry vision, no rash, no diarrhea, no nausea/ vomiting, no dysuria, no difficulty voiding   Past Medical History  Past Medical History:  Diagnosis Date   Ascites    Diabetes mellitus without complication ([name])    Hernia of abdominal cavity    Ileus ([facility])    Prostate cancer (Ryan [name])    Past Surgical History  Past Surgical History:  Procedure Laterality Date   IR IMAGING GUIDED PORT INSERTION  11/28/2021   PROSTATE SURGERY     Family History  Family History  Problem Relation Age of Onset   Cancer Neg Hx    Social History  reports that he has been smoking cigarettes. He has been smoking  an average of .5 packs per day. He has never used smokeless tobacco. He reports current alcohol use. He reports that he does not use drugs. Allergies  Allergies  Allergen Reactions   Hydralazine Shortness Of Breath, Swelling and Rash    Given hydralazine x1 hour prior to allergic reaction.   Percocet [Oxycodone-Acetaminophen] Anaphylaxis    Given within 1 hour of symptom onset   Home medications Prior to Admission medications   Medication Sig Start Date End Date Taking? Authorizing Provider  atorvastatin (LIPITOR) 20 MG tablet Take 20 mg by mouth daily. 10/01/21  Yes [provider]  FEROSUL 325 (65 Fe) MG tablet Take 325 mg by mouth every other day. 10/03/21  Yes [provider]  metFORMIN (GLUCOPHAGE) 1000 MG tablet Take 500-1,000 mg by mouth 2 (two) times daily with a meal.   Yes [provider]  zolpidem (AMBIEN) 10 MG tablet Take 10 mg by mouth at bedtime as needed for sleep. 11/21/21  Yes [provider]  Elastic Bandages & Supports (ABDOMINAL BINDER/ELASTIC 2XL) MISC 1 Units by Does not apply route daily. 05/25/16   Ryan [name], April, MD  lisinopril (ZESTRIL) 20 MG tablet Take 20 mg by mouth daily.    [provider]  memantine (NAMENDA) 10 MG tablet Take 1 tablet (10 mg total) by mouth 2 (two) times daily. 12/01/21  Ryan Pedro, PA-C  memantine [facility]) 5 MG tablet Begin this prescription the first day of brain radiation. Week 1: take one tablet po qam. Week 2: take one tablet qam and qpm. Week 3: take two tablets qam, and one tablet po q pm. Week 4: take two tablets qam and qpm. Fill subsequent prescription q month. 12/01/21   Ryan Pedro, PA-C  NOVOLOG MIX 70/30 FLEXPEN (70-30) 100 UNIT/ML FlexPen Inject into the skin. Patient not taking: Reported on 11/27/2021 08/12/21   [provider]     Vitals:   12/02/21 0305 12/02/21 0400 12/02/21 0500 12/02/21 0730  BP: (!) 180/76 (!) 183/87 (!) 124/104   Pulse: 85 85  90   Resp:  16    Temp: (!) 97.5 F (36.4 C)   97.9 F (36.6 C)  TempSrc: Axillary   Oral  SpO2: 97% 95% 95%   Weight:      Height:       Exam Gen alert, no distress, heavy set man up in chair No rash, cyanosis or gangrene Sclera anicteric, throat clear  No jvd or bruits Chest clear bilat to bases, no rales/ wheezing RRR no MRG Abd soft ntnd no mass or ascites +bs GU normal MS no joint effusions or deformity Ext no LE edema, 1+ RUE edema, no wounds or ulcers Neuro is alert, Ox 3 , nonfocal    Home meds include - liptor, metformin, amvien, zestril, namenda, novolog 70-30 not clear dosing, prns/ supps/ vits     UNa 38, UOsm 343    UA 12/7 - wnl    BP's are high 160- 197/ 70- 90, hR 80-90,  RR 15-18, afeb     Na 121  CO2 21  BUN 16  cr 0.47   WBC 15K  Hb 12.9    Assessment/ Plan: Hyponatremia - pt is euvolemic, no signs or hx of renal / liver/ heart failure.  TSH and am cortisol ordered for tomorrow am. 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  No meds implicated. Recommend tolvaptan x 1, f/u labs in the morning. Plan further rx w/ salt tabs/ po lasix and fluid restriction once Na+ is > 125. Will follow.  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 Per ONC treatment will be palliative due to incurable nature of his disease.  Hx prostate cancer -sp prostatectomy DM2       Ryan Lecil Tapp  MD 12/02/2021, 8:42 AM  Recent Labs  Lab 11/29/21 0325 12/02/21 0556  WBC 6.7 15.5*  HGB 12.2* 12.9*   Recent Labs  Lab 11/29/21 0325 11/30/21 0330 12/01/21 0314 12/01/21 0945 12/02/21 0556  K 4.2   < > 4.2 4.0 3.9  BUN 19   < > 16 16 16   CREATININE 0.53*   < > 0.45* 0.53* 0.47*  CALCIUM 9.0   < > 9.2 8.8* 8.9  PHOS 3.2  --  3.4  --   --    < > = values in this interval not displayed.

## 2021-12-02 NOTE — Progress Notes (Signed)
PROGRESS NOTE    Ryan [name]  PYP:[id number] DOB: [birth year] DOA: 11/26/2021  PCP: [name], DO   Brief Narrative:  This 65 years old male with PMH significant for prostate cancer, type 2 diabetes presented in the ED with 3 weeks history of progressive weakness, decreased appetite, 20 pound weight loss.  Patient also been experiencing episodes of severe dizziness and disequilibrium. He reports he was diagnosed with COVID 3 weeks ago.  Work-up in the ED shows sodium 118.  CT chest showed 2.9 cm lobulated noncalcified pleural-based nodule in the right upper lobe suggesting possible primary malignant neoplasm.  There is multiple lesions noted in the brain, adrenal gland and nearby lymph nodes suggesting metastasis. 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  Sodium is slowly improving with fluid restriction.  Oncology is consulted recommended dexamethasone, MRI brain confirms mets.  Patient underwent CT-guided biopsy of abdominal peritoneal mass.  Patient also underwent Port-A-Cath placement for chemotherapy. Patient is started radiation treatment.  Plan is to start chemotherapy once completes radiation treatment.  Sodium continues to remain low,  Nephrology consulted.  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 Sodium was slowly improving with fluid restriction. Continue to monitor 118> 117> 120> 121> 123> 124> 125>124 >126 >121. Patient gets frustrated and wants to be discharged. We explained the rationale for not discharging until sodium gets within 130 range.   Explained to the patient that he is at risk for seizures or life-threatening arrhythmias.  He understood. Nephrology consulted,  awaiting recommendation.  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 IR consulted for biopsy of peritoneal lesion. Oncology is consulted,  recommended to continue dexamethasone. Oncology also recommended radiation oncology consult for radiation to the brain and possibly chest due to impending SVC syndrome. MRI brain confirms the metastatic lesions in the brain.Invasion and obliteration of the posterior superior sagittal sinus by the above described dural based mass. Patient underwent successful CT-guided biopsy of the abdominal mass, He also underwent Port-A-Cath placement by IR on 12/9. Patient started radiation treatment on 12/12.  Oncology is following.  Plan is to start chemotherapy once he completes radiation.  Chemotherapy would be palliative in nature. Patient will need outpatient PET/CT imaging.  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 Which will get better with radiation.  History of prostate cancer: S/p prostatectomy with normal PSA.  Diabetes mellitus: Continue regular insulin sliding scale.  Essential hypertension: Hold Lisinopril. Continue amlodipine 10 mg daily.  Constipation: Continue senna and Colace. MiraLAX daily.  Goals of care discussion.: Patient does have stage IV lung cancer with metastasis.  Chemotherapy would be palliative in nature. Patient needs goals of care discussion about prognosis.  DVT prophylaxis:  SCDs Code Status: Full code. Family Communication: Wife in the room. Disposition Plan:  Status is: Inpatient  Remains inpatient appropriate because:  Hyponatremia, metastatic lung cancer requiring complete work-up,  underwent biopsy of the peritoneal mass. Port-A-Cath placement, Patient started radiation therapy to the brain and chest on  12/12.  Anticipated discharge home once sodium  improves above 130.  Consultants:  Oncology, radiation oncology  Procedures: CT guided biopsy of the peritoneal mass.  Port a cath placement  Antimicrobials:   Anti-infectives (From admission, onward)    None       Subjective: Patient was seen and examined at bedside.  Overnight events noted.   Patient seems comfortable but remains anxious.  He has been getting radiation treatment to the brain and chest. He has developed an allergic reaction to Percocet given couple of times which got better with Benadryl.  Objective: Vitals:   12/02/21 0400 12/02/21 0500 12/02/21 0730 12/02/21 1300  BP: (!) 183/87 (!) 124/104    Pulse: 85 90    Resp: 16     Temp:   97.9 F (36.6 C) (!) 96.9 F (36.1 C)  TempSrc:   Oral Axillary  SpO2: 95% 95%    Weight:      Height:        Intake/Output Summary (Last 24 hours) at 12/02/2021 1340 Last data filed at 12/02/2021 0830 Gross per 24 hour  Intake 120 ml  Output --  Net 120 ml   Filed Weights   11/26/21 0924  Weight: 106.6 kg    Examination:  General exam: Appears anxious, worried, deconditioned, not in any distress.  Respiratory system: Clear to auscultation bilaterally. Respiratory effort normal. RR15 Cardiovascular system: S1-S2 heard, regular rate and rhythm, no murmur. Gastrointestinal system: Abdomen is soft, nontender, nondistended, BS +. Central nervous system: Alert and oriented x 3. No focal neurological deficits. Extremities: No edema, no cyanosis, no clubbing. Skin: No rashes, lesions or ulcers Psychiatry:  Mood: depressed,  & affect flat.    Data Reviewed: I have personally reviewed following labs and imaging studies  CBC: Recent Labs  Lab 11/26/21 1130 11/29/21 0325 12/02/21 0556  WBC 8.7 6.7 15.5*  NEUTROABS 7.2  --   --   HGB 12.0* 12.2* 12.9*  HCT 34.3* 37.0* 38.0*  MCV 90.5 95.1 91.6  PLT 247 231 629   Basic Metabolic Panel: Recent Labs  Lab  11/26/21 1130 11/26/21 1329 11/29/21 0325 11/30/21 0330 12/01/21 0314 12/01/21 0945 12/02/21 0556  NA 118*   < > 124* 126* 121* 121* 121*  K 4.2   < > 4.2 4.0 4.2 4.0 3.9  CL 86*   < > 91* 92* 90* 90* 89*  CO2 23   < > 21* 23 19* 22 21*  GLUCOSE 119*   < > 140* 163* 155* 196* 167*  BUN 12   < > 19 16 16 16 16   CREATININE 0.43*   < > 0.53* 0.48* 0.45* 0.53* 0.47*  CALCIUM 8.8*   < > 9.0 9.2 9.2 8.8* 8.9  MG 1.6*  --  2.0  --  1.9  --   --   PHOS  --   --  3.2  --  3.4  --   --    < > = values in this interval not displayed.   GFR: Estimated Creatinine Clearance: 116.6 mL/min (A) (by C-G formula based on SCr of 0.47 mg/dL (L)). Liver Function Tests: Recent Labs  Lab 11/26/21 1130 12/02/21 0556  AST 27 28  ALT 15 31  ALKPHOS 69 65  BILITOT 0.6 0.9  PROT 7.1 7.2  ALBUMIN 3.7 3.6   Recent Labs  Lab 11/26/21 1130  LIPASE 114*   No results for input(s): AMMONIA in the last 168 hours. Coagulation Profile: Recent Labs  Lab 11/28/21 0023  INR 0.9   Cardiac  Enzymes: No results for input(s): CKTOTAL, CKMB, CKMBINDEX, TROPONINI in the last 168 hours. BNP (last 3 results) No results for input(s): PROBNP in the last 8760 hours. HbA1C: No results for input(s): HGBA1C in the last 72 hours.  CBG: Recent Labs  Lab 12/01/21 1131 12/01/21 1745 12/01/21 2204 12/02/21 0759 12/02/21 1311  GLUCAP 178* 217* 195* 168* 172*   Lipid Profile: No results for input(s): CHOL, HDL, LDLCALC, TRIG, CHOLHDL, LDLDIRECT in the last 72 hours. Thyroid Function Tests: No results for input(s): TSH, T4TOTAL, FREET4, T3FREE, THYROIDAB in the last 72 hours. Anemia Panel: No results for input(s): VITAMINB12, FOLATE, FERRITIN, TIBC, IRON, RETICCTPCT in the last 72 hours. Sepsis Labs: No results for input(s): PROCALCITON, LATICACIDVEN in the last 168 hours.  Recent Results (from the past 240 hour(s))  Resp Panel by RT-PCR (Flu A&B, Covid) Nasopharyngeal Swab     Status: Abnormal   Collection  Time: 11/26/21  4:04 PM   Specimen: Nasopharyngeal Swab; Nasopharyngeal(NP) swabs in vial transport medium  Result Value Ref Range Status   SARS Coronavirus 2 by RT PCR POSITIVE (A) NEGATIVE Final    Comment: RESULT CALLED TO, READ BACK BY AND VERIFIED WITH: JAMIE BAILEY RN @1646  11/26/2021 OLSONM (NOTE) SARS-CoV-2 target nucleic acids are DETECTED.  The SARS-CoV-2 RNA is generally detectable in upper respiratory specimens during the acute phase of infection. Positive results are indicative of the presence of the identified virus, but do not rule out bacterial infection or co-infection with other pathogens not detected by the test. Clinical correlation with patient history and other diagnostic information is necessary to determine patient infection status. The expected result is Negative.  Fact Sheet for Patients: EntrepreneurPulse.com.au  Fact Sheet for Healthcare Providers: IncredibleEmployment.be  This test is not yet approved or cleared by the [place] FDA and  has been authorized for detection and/or diagnosis of SARS-CoV-2 by FDA under an Emergency Use Authorization (EUA).  This EUA will remain in effect (meaning this test  can be used) for the duration of  the COVID-19 declaration under Section 564(b)(1) of the Act, 21 U.S.C. section 360bbb-3(b)(1), unless the authorization is terminated or revoked sooner.     Influenza A by PCR NEGATIVE NEGATIVE Final   Influenza B by PCR NEGATIVE NEGATIVE Final    Comment: (NOTE) The Xpert Xpress SARS-CoV-2/FLU/RSV plus assay is intended as an aid in the diagnosis of influenza from Nasopharyngeal swab specimens and should not be used as a sole basis for treatment. Nasal washings and aspirates are unacceptable for Xpert Xpress SARS-CoV-2/FLU/RSV testing.  Fact Sheet for Patients: EntrepreneurPulse.com.au  Fact Sheet for Healthcare  Providers: IncredibleEmployment.be  This test is not yet approved or cleared by the [place] FDA and has been authorized for detection and/or diagnosis of SARS-CoV-2 by FDA under an Emergency Use Authorization (EUA). This EUA will remain in effect (meaning this test can be used) for the duration of the COVID-19 declaration under Section 564(b)(1) of the Act, 21 U.S.C. section 360bbb-3(b)(1), unless the authorization is terminated or revoked.  Performed at [facility], [address] [place], [place] [address]   MRSA Next Gen by PCR, Nasal     Status: None   Collection Time: 11/26/21 11:44 PM   Specimen: Nasal Mucosa; Nasal Swab  Result Value Ref Range Status   MRSA by PCR Next Gen NOT DETECTED NOT DETECTED Final    Comment: (NOTE) The GeneXpert MRSA Assay (FDA approved for NASAL specimens only), is one component of a comprehensive MRSA  colonization surveillance program. It is not intended to diagnose MRSA infection nor to guide or monitor treatment for MRSA infections. Test performance is not FDA approved in patients less than 17 years old. Performed at [facility], [place] [address] [place], [place] [address]     Radiology Studies: DG CHEST PORT 1 VIEW  Result Date: 11/30/2021 CLINICAL DATA:  Cough, prostate cancer, COVID EXAM: PORTABLE CHEST 1 VIEW COMPARISON:  None. FINDINGS: Right Port-A-Cath tip at the cavoatrial junction. Heart is normal size. Left lung clear. Right hilar and mediastinal adenopathy again noted. Peripheral right upper lobe nodule/mass again noted. No effusions. No acute bony abnormality. IMPRESSION: Right upper lobe mass with right hilar and mediastinal adenopathy. Electronically Signed   By: Rolm Baptise M.D.   On: 11/30/2021 17:32     Scheduled Meds:  (feeding supplement) PROSource Plus  30 mL Oral Daily   amLODipine  10 mg Oral Daily   Chlorhexidine Gluconate Cloth  6 each Topical Daily    dexamethasone (DECADRON) injection  4 mg Intravenous Q8H   docusate sodium  100 mg Oral BID   enoxaparin (LOVENOX) injection  40 mg Subcutaneous Daily   feeding supplement  1 Container Oral Q24H   feeding supplement  237 mL Oral BID BM   insulin aspart  0-15 Units Subcutaneous TID WC   mouth rinse  15 mL Mouth Rinse BID   memantine  5 mg Oral q AM   multivitamin with minerals  1 tablet Oral Daily   nicotine  7 mg Transdermal Daily   pantoprazole  40 mg Oral Daily   polyethylene glycol  17 g Oral Daily   sodium chloride flush  10-40 mL Intracatheter Q12H   Continuous Infusions:   LOS: 6 days    Time spent: 25 mins    Shawna Clamp, MD [facility]   If 7PM-7AM, please contact night-coverage

## 2021-12-02 NOTE — Progress Notes (Addendum)
HEMATOLOGY-ONCOLOGY PROGRESS NOTE  I seen him and edited the notes as follows ASSESSMENT & PLAN Extensive stage high-grade neuroendocrine carcinoma, small cell type Imaging findings discussed with the patient and his significant other Imaging findings have previously been discussed with the patient and his significant other I discussed the biopsy results with the patient and significant other this morning Recommend for him to proceed with radiation to the chest for impending SVC syndrome and radiation to the brain for brain metastasis. 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 Sodium remains low at 121 and nephrology has been consulted Monitor I explained to the patient the rationale of not discharging him until his sodium is consistently stable over 130 due to high risk of arrhythmia and seizures Unfortunately, this will not likely to improve until he gets started on systemic treatment   History of prostate cancer Status post prostatectomy with normal PSA in October 2022 He is followed as an outpatient by his primary care provider/urology   Diabetes mellitus On sliding scale insulin Management per hospitalist  Constipation Continue Colace 100 mg twice daily and add MiraLAX daily   Goals of care discussion We discussed biopsy results today and the incurable nature of his disease We discussed treatment will palliative in nature His significant other is currently working on advanced directives but would like the patient to be more coherent before completing these  documents   Discharge plan The patient needs to stay in the hospital due to safety and high risk of seizures with significant hyponatremia I am hopeful he can be moved to [address]. to start chemotherapy as soon as possible  SUBJECTIVE: Sitting up in the recliner this a.m. Significant other at the bedside His voice remains hoarse Reports ongoing constipation Significant other reports that his mental status waxes and wanes  REVIEW OF SYSTEMS:   Constitutional: Denies fevers, chills  Eyes: Denies blurriness of vision Ears, nose, mouth, throat, and face: Denies mucositis or sore throat Respiratory: Positive for cough Cardiovascular: Denies palpitation, chest discomfort Gastrointestinal:  Denies nausea, heartburn or change in bowel habits Skin: Denies abnormal skin rashes Lymphatics: Denies new lymphadenopathy or easy bruising Neurological:Denies numbness, tingling or new weaknesses Behavioral/Psych: Mood is stable, no new changes  Extremities: No lower extremity edema All other systems were reviewed with the patient and are negative.  I have reviewed the past medical history, past surgical history, social history and family history with the patient and they are unchanged from previous note.   PHYSICAL EXAMINATION: ECOG PERFORMANCE STATUS: 2 - Symptomatic, <50% confined to bed  Vitals:   12/02/21 0500 12/02/21 0730  BP: (!) 124/104   Pulse: 90   Resp:    Temp:  97.9 F (36.6 C)  SpO2: 95%    Filed Weights   11/26/21 0924  Weight: 106.6 kg    Intake/Output from previous day: No intake/output data recorded.  GENERAL:alert, no distress and comfortable SKIN: Faint rash to his arms EYES: normal, Conjunctiva are pink and non-injected, sclera clear OROPHARYNX:no exudate, no erythema and lips, buccal mucosa, and tongue normal  LUNGS: clear to auscultation and percussion with normal breathing effort HEART: regular rate &  rhythm and no murmurs and no lower extremity  edema ABDOMEN:abdomen soft, non-tender and normal bowel sounds  NEURO: alert & oriented x 3 with fluent speech, no focal motor/sensory deficits  LABORATORY DATA:  I have reviewed the data as listed CMP Latest Ref Rng & Units 12/02/2021 12/01/2021 12/01/2021  Glucose 70 - 99 mg/dL 167(H) 196(H) 155(H)  BUN 8 - 23 mg/dL 16 16 16   Creatinine 0.61 - 1.24 mg/dL 0.47(L) 0.53(L) 0.45(L)  Sodium 135 - 145 mmol/L 121(L) 121(L) 121(L)  Potassium 3.5 - 5.1 mmol/L 3.9 4.0 4.2  Chloride 98 - 111 mmol/L 89(L) 90(L) 90(L)  CO2 22 - 32 mmol/L 21(L) 22 19(L)  Calcium 8.9 - 10.3 mg/dL 8.9 8.8(L) 9.2  Total Protein 6.5 - 8.1 g/dL - - -  Total Bilirubin 0.3 - 1.2 mg/dL - - -  Alkaline Phos 38 - 126 U/L - - -  AST 15 - 41 U/L - - -  ALT 0 - 44 U/L - - -    Lab Results  Component Value Date   WBC 15.5 (H) 12/02/2021   HGB 12.9 (L) 12/02/2021   HCT 38.0 (L) 12/02/2021   MCV 91.6 12/02/2021   PLT 186 12/02/2021   NEUTROABS 7.2 11/26/2021    CT Head Wo Contrast  Result Date: 11/26/2021 CLINICAL DATA:  Dizziness and disequilibrium. Symptoms for 1 month. COVID 3 weeks ago. Prostate cancer with new lung mass. EXAM: CT HEAD WITHOUT CONTRAST TECHNIQUE: Contiguous axial images were obtained from the base of the skull through the vertex without intravenous contrast. COMPARISON:  None. FINDINGS: Brain: A midline extra-axial hyperdense mass lesion surrounds the superior sagittal sinus. The lesion measures 5.9 x 3.5 x 4.4 cm. An area of hypoattenuation is present in the posterior right frontal lobe adjacent to the lesion. There is mass effect on the posterior frontal and parietal lobes bilaterally. Separate hyperdense focus is present in the cortical or subcortical left parietal lobe on axial image 24 of series 5. Similar subcortical hyperdensity is present in the left occipital lobe on axial image 17 of series 5. Areas of hypoattenuation are present in the cerebellum. The largest is in midline with some surrounding  hyperdensity, also concerning for a metastatic lesion. It measures 2.2 x 2.1 cm on saddle sagittal image 29 of series 4. Smaller hypodense left cystic lesions the cerebellum are also concerning metastases. Infarct is considered less likely. A hypodense lesion in the right temporal lobe measures 11 mm. The ventricles are of normal size.  No significant fluid is present Vascular: No hyperdense vessel or unexpected calcification. Skull: Calvarium is intact. No focal lytic or blastic lesions are present. No significant extracranial soft tissue lesion is present. Sinuses/Orbits: Right maxillary sinus opacification is likely chronic. There is some wall thickening. Minimal air is present in the sinus. The paranasal sinuses and mastoid air cells are otherwise clear. The globes and orbits are within normal limits. IMPRESSION: 1. 5.9 x 3.5 x 4.4 cm midline extra-axial hyperdense mass lesion surrounds the superior sagittal sinus. This most likely represents a metastasis. Meningioma is considered less likely. 2. Other hyperdense lesions are present in the cortical or subcortical left parietal lobe, left occipital lobe, and right temporal lobe concerning for metastatic disease. 3. There is mass effect on the posterior frontal and parietal lobes bilaterally. 4. Recommend MRI the brain without and contrast for further evaluation of these lesions. 5. Chronic right maxillary sinus disease. These results were called by telephone at the time of interpretation on 11/26/2021 at  1:30 pm to provider Dr. Melina Copa, who verbally acknowledged these results. Electronically Signed   By: San Morelle M.D.   On: 11/26/2021 13:46   MR Brain W and Wo Contrast  Result Date: 11/28/2021 CLINICAL DATA:  Initial evaluation for brain mass. EXAM: MRI HEAD WITHOUT AND WITH CONTRAST MRV HEAD WITHOUT CONTRAST TECHNIQUE: Multiplanar, multiecho pulse sequences of the brain and surrounding structures were obtained without and with intravenous contrast.  Angiographic images of the intracranial venous structures were obtained using MRV technique without intravenous contrast. CONTRAST:  56mL GADAVIST GADOBUTROL 1 MMOL/ML IV SOLN COMPARISON:  Prior CT from 11/26/2021. FINDINGS: MRI HEAD FINDINGS: Brain: Cerebral volume within normal limits. Multiple scattered intraparenchymal lesions are seen involving both cerebral hemispheres as well as the cerebellum, consistent with widespread metastatic disease. There are at least 20 lesions. For reference purposes, the largest lesion within the right cerebral hemisphere is present at the right temporal lobe in measures 1.5 x 1.2 x 0.7 cm (series 26, image 22). The largest lesion within the left cerebral hemisphere is positioned at the left occipital pole and measures 1.5 x 1.7 x 1.7 cm (series 26, image 21). Largest distinct infratentorial lesion is seen within the central cerebellum and measures 2.1 x 2.2 x 2.5 cm (series 26, image 12). Several of these lesions are somewhat cystic in appearance. Few lesions demonstrate intrinsic T1 hyperintensity and/or SWI signal, consistent with hemorrhage and/or blood products, most notable at the central cerebellum (series 11, image 11). Mild localized edema about several of these lesions without significant regional mass effect. An additional large enhancing extra-axial mass positioned at the posterior aspect of the superior sagittal sinus measures 3.2 x 6.6 x 4.8 cm (series 28, image 13). Scattered areas of internal susceptibility artifact consistent with blood products. Mild localized vasogenic edema without significant mass effect. This lesion is somewhat indeterminate, and could reflect an additional dural-based metastasis. Concomitant meningioma could also be considered. This lesion invades and obliterates the adjacent superior sagittal sinus, better evaluated on concomitant MRV. Mild diffuse smooth pachymeningeal thickening and enhancement is likely secondary. No definite evidence for  acute or subacute ischemia. Note made of a punctate focus of diffusion abnormality at the right basal ganglia, favored to reflect a small metastasis rather than infarct (series 5, image 75). No midline shift or hydrocephalus. No other extra-axial fluid collection. Pituitary gland suprasellar region within normal limits. Vascular: Major intracranial arterial vascular flow voids are maintained. Skull and upper cervical spine: Craniocervical junction within normal limits. Heterogeneous signal intensity seen within the visualized bone marrow without focal marrow replacing lesion. No scalp soft tissue abnormality. Sinuses/Orbits: Globes within normal limits. Increased CSF signal intensity seen along the optic nerve sheaths bilaterally, suggesting elevated intracranial pressures. Orbital soft tissues otherwise unremarkable. Moderate mucosal thickening within the right maxillary sinus. Paranasal sinuses are otherwise largely clear. Small right with trace left mastoid effusions. Other: None. MRV HEAD FINDINGS: The above described dural based mass again noted at the posterior aspect of the superior sagittal sinus. The mass invades and obliterates the superior sagittal sinus at this level (series 1, image 98). Tubular filling defect is seen within the superior sagittal sinus anterior to the mass, likely a small amount of nonocclusive thrombus (series 13, images 88, 102). Superior sagittal sinus otherwise patent anteriorly. Additional extension of the lesion to involve the posterior aspect of the suture superior sagittal sinus as it courses towards the torcula (series 12, image 82) the torcula itself is patent. Transverse and sigmoid sinuses are patent as  are the visualized proximal internal jugular veins. Straight sinus, vein of Galen, inferior sagittal sinus, internal cerebral veins, and basal veins of Rosenthal are patent. No abnormality about the cavernous sinus. Superior orbital veins symmetric and within normal limits.  No appreciable cortical venous thrombosis. IMPRESSION: MRI HEAD IMPRESSION: 1. 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 Mild localized edema with hemorrhagic blood products about several of these lesions without significant regional mass effect or midline shift. 2. 3.2 x 6.6 x 4.8 cm enhancing extra-axial mass at the posterior aspect of the superior sagittal sinus, indeterminate. While this finding could reflect an additional dural-based metastasis, a possible concomitant meningioma could also be considered. MRV HEAD IMPRESSION: 1. Invasion and obliteration of the posterior superior sagittal sinus by the above described dural based mass. Small amount of additional nonocclusive thrombus within the superior sagittal sinus anterior to the mass as above. 2. Remainder of the major dural sinuses are otherwise patent. Electronically Signed   By: Jeannine Boga M.D.   On: 11/28/2021 00:09   MR Venogram Head  Result Date: 11/28/2021 CLINICAL DATA:  Initial evaluation for brain mass. EXAM: MRI HEAD WITHOUT AND WITH CONTRAST MRV HEAD WITHOUT CONTRAST TECHNIQUE: Multiplanar, multiecho pulse sequences of the brain and surrounding structures were obtained without and with intravenous contrast. Angiographic images of the intracranial venous structures were obtained using MRV technique without intravenous contrast. CONTRAST:  19mL GADAVIST GADOBUTROL 1 MMOL/ML IV SOLN COMPARISON:  Prior CT from 11/26/2021. FINDINGS: MRI HEAD FINDINGS: Brain: Cerebral volume within normal limits. Multiple scattered intraparenchymal lesions are seen involving both cerebral hemispheres as well as the cerebellum, consistent with widespread metastatic disease. There are at least 20 lesions. For reference purposes, the largest lesion within the right cerebral hemisphere is present at the right temporal lobe in measures 1.5 x 1.2 x 0.7 cm (series 26, image 22). The largest lesion  within the left cerebral hemisphere is positioned at the left occipital pole and measures 1.5 x 1.7 x 1.7 cm (series 26, image 21). Largest distinct infratentorial lesion is seen within the central cerebellum and measures 2.1 x 2.2 x 2.5 cm (series 26, image 12). Several of these lesions are somewhat cystic in appearance. Few lesions demonstrate intrinsic T1 hyperintensity and/or SWI signal, consistent with hemorrhage and/or blood products, most notable at the central cerebellum (series 11, image 11). Mild localized edema about several of these lesions without significant regional mass effect. An additional large enhancing extra-axial mass positioned at the posterior aspect of the superior sagittal sinus measures 3.2 x 6.6 x 4.8 cm (series 28, image 13). Scattered areas of internal susceptibility artifact consistent with blood products. Mild localized vasogenic edema without significant mass effect. This lesion is somewhat indeterminate, and could reflect an additional dural-based metastasis. Concomitant meningioma could also be considered. This lesion invades and obliterates the adjacent superior sagittal sinus, better evaluated on concomitant MRV. Mild diffuse smooth pachymeningeal thickening and enhancement is likely secondary. No definite evidence for acute or subacute ischemia. Note made of a punctate focus of diffusion abnormality at the right basal ganglia, favored to reflect a small metastasis rather than infarct (series 5, image 75). No midline shift or hydrocephalus. No other extra-axial fluid collection. Pituitary gland suprasellar region within normal limits. Vascular: Major intracranial arterial vascular flow voids are maintained. Skull and upper cervical spine: Craniocervical junction within normal limits. Heterogeneous signal intensity seen within the visualized bone marrow without focal marrow replacing lesion. No scalp soft tissue abnormality. Sinuses/Orbits: Globes within  normal limits. Increased  CSF signal intensity seen along the optic nerve sheaths bilaterally, suggesting elevated intracranial pressures. Orbital soft tissues otherwise unremarkable. Moderate mucosal thickening within the right maxillary sinus. Paranasal sinuses are otherwise largely clear. Small right with trace left mastoid effusions. Other: None. MRV HEAD FINDINGS: The above described dural based mass again noted at the posterior aspect of the superior sagittal sinus. The mass invades and obliterates the superior sagittal sinus at this level (series 1, image 98). Tubular filling defect is seen within the superior sagittal sinus anterior to the mass, likely a small amount of nonocclusive thrombus (series 13, images 88, 102). Superior sagittal sinus otherwise patent anteriorly. Additional extension of the lesion to involve the posterior aspect of the suture superior sagittal sinus as it courses towards the torcula (series 12, image 82) the torcula itself is patent. Transverse and sigmoid sinuses are patent as are the visualized proximal internal jugular veins. Straight sinus, vein of Galen, inferior sagittal sinus, internal cerebral veins, and basal veins of Rosenthal are patent. No abnormality about the cavernous sinus. Superior orbital veins symmetric and within normal limits. No appreciable cortical venous thrombosis. IMPRESSION: MRI HEAD IMPRESSION: 1. 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 Mild localized edema with hemorrhagic blood products about several of these lesions without significant regional mass effect or midline shift. 2. 3.2 x 6.6 x 4.8 cm enhancing extra-axial mass at the posterior aspect of the superior sagittal sinus, indeterminate. While this finding could reflect an additional dural-based metastasis, a possible concomitant meningioma could also be considered. MRV HEAD IMPRESSION: 1. Invasion and obliteration of the posterior superior sagittal sinus by  the above described dural based mass. Small amount of additional nonocclusive thrombus within the superior sagittal sinus anterior to the mass as above. 2. Remainder of the major dural sinuses are otherwise patent. Electronically Signed   By: Jeannine Boga M.D.   On: 11/28/2021 00:09   CT CHEST ABDOMEN PELVIS W CONTRAST  Result Date: 11/26/2021 CLINICAL DATA:  Persistent cough, shortness of breath EXAM: CT CHEST, ABDOMEN, AND PELVIS WITH CONTRAST TECHNIQUE: Multidetector CT imaging of the chest, abdomen and pelvis was performed following the standard protocol during bolus administration of intravenous contrast. CONTRAST:  120mL OMNIPAQUE IOHEXOL 300 MG/ML  SOLN COMPARISON:  Previous studies including chest radiographs done on 11/24/2021 FINDINGS: CT CHEST FINDINGS Cardiovascular: Coronary artery calcifications are seen. There is homogeneous enhancement in thoracic aorta. There are no intraluminal filling defects in central pulmonary artery branches. There is extrinsic compression of right main pulmonary artery caused by pathologically enlarged lymph nodes in mediastinum and right hilum. There is extrinsic pressure over the superior vena cava caused by lymphadenopathy. Mediastinum/Nodes: There is bulky lymphadenopathy in the mediastinum extending to the right hilum measuring approximately 8.6 x 8.6 x 9.5 cm. There is 2.6 x 1.9 cm node in the left hilum. Lungs/Pleura: Centrilobular emphysema is seen. In image 54 of series 4, there is 2.9 x 2.4 cm pleural-based noncalcified nodule in the right upper lobe. There are increased interstitial markings in the right upper lobe. In the image 71, there is 12 mm nodular density in the right upper lobe. In the image 73, there is 6 mm nodule in the right upper lobe. There are 3 small nodular densities each measuring less than 9 mm in size in the right lower lobe. Musculoskeletal: Unremarkable. CT ABDOMEN PELVIS FINDINGS Hepatobiliary: Liver measures 17.5 cm in length.  In the image 58 of series 2, there is 1.6  cm fluid density lesion in the left lobe, possibly cyst. There are other scattered low-density foci in the right lobe some of which may be cysts. Possibility of space-occupying neoplastic process is not excluded. There are slightly enlarged lymph nodes anterior to the liver and adjacent to the pericardium largest measuring 2.7 x 1.7 cm. There is no dilation of bile ducts. Gallbladder is not distended. Pancreas: Head of the pancreas is prominent in size. There is no dilation of pancreatic duct. No focal abnormality is seen in the body and tail of pancreas. Spleen: Spleen is not enlarged. Adrenals/Urinary Tract: There is 3 cm nodule in the left adrenal. There is 2 cm nodule in the right adrenal. There is no hydronephrosis. There are no renal or ureteral stones. There are smooth marginated nodular densities adjacent to the kidneys largest measuring 3.6 cm posterior to the upper pole of right kidney. Density measurements in this lesions are higher than usual for simple cysts. Ureters not dilated. Urinary bladder is not distended. Stomach/Bowel: Stomach is unremarkable. Small bowel loops are not dilated. Appendix is not dilated. There is no significant wall thickening in colon. Vascular/Lymphatic: There are scattered atherosclerotic plaques and calcifications in the aorta and its major branches. There are pathologically enlarged lymph nodes in the mesentery and retroperitoneum. Largest of these nodes is noted anterior to the transverse colon measuring 5.9 x 3 cm. There are multiple other smaller nodules of varying sizes in the abdomen in the mesentery and retroperitoneum. Reproductive: Prostate is not enlarged. There is fluid density in the the region of prostatic urethra. This may suggest previous prostate surgery. Other: In image 76 of series 2, there is a 12 mm subcutaneous nodule in the right anterior abdominal wall. Small bilateral inguinal hernias containing fat are seen.  Musculoskeletal: Unremarkable. IMPRESSION: There is 2.9 cm lobulated noncalcified pleural-based nodule in the right upper lobe suggesting possible primary malignant neoplasm. There are pathologically enlarged bulky lymph nodes in the mediastinum and both hilar regions, more so on the right side suggesting metastatic lymphadenopathy. There is extrinsic pressure over the right main pulmonary artery and superior vena cava by the enlarged lymph nodes in mediastinum. Increased interstitial markings and small scattered nodules are seen in the right lung which may be part of pneumonia or pulmonary metastatic disease. There are nodular densities in both adrenals largest measuring 3 cm in the left adrenal suggesting possible metastatic disease. There are enlarged lymph nodes in the retroperitoneum and mesentery largest measuring 5.9 cm in maximum diameter suggesting metastatic lymphadenopathy. There are multiple cysts in the liver. There are other indeterminate low-density lesions of varying sizes in the liver. Possibility of hepatic metastatic disease is not excluded. Head of the pancreas is enlarged in size with lobulated margins. Possibility of neoplastic process in the head of the pancreas is not excluded. Follow-up PET-CT and biopsy as warranted should be considered. Coronary artery calcifications are seen. Other findings as described in the body of the report. Electronically Signed   By: Elmer Picker M.D.   On: 11/26/2021 13:49   CT BIOPSY  Result Date: 11/28/2021 INDICATION: 65 year old with evidence of diffuse metastatic disease. Tissue diagnosis is needed. EXAM: CT-GUIDED BIOPSY OF ANTERIOR ABDOMINAL PERITONEAL MASS MEDICATIONS: Moderate sedation ANESTHESIA/SEDATION: Moderate (conscious) sedation was employed during this procedure. A total of Versed 2.0mg  and fentanyl 100 mcg was administered intravenously at the order of the provider performing the procedure. Total intra-service moderate sedation time:  14 minutes. Patient's level of consciousness and vital signs were monitored continuously by  radiology nurse throughout the procedure under the supervision of the provider performing the procedure. FLUOROSCOPY TIME:  None COMPLICATIONS: None immediate. PROCEDURE: Informed written consent was obtained from the patient after a thorough discussion of the procedural risks, benefits and alternatives. All questions were addressed. A timeout was performed prior to the initiation of the procedure. Patient was placed supine on CT scanner. Images of the upper abdomen are obtained. The large mass in the anterior upper abdomen was identified and targeted. The skin was prepped with chlorhexidine and sterile field was created. Skin and soft tissues were anesthetized with 1% lidocaine. A small incision was made. Using CT guidance, 17 gauge coaxial needle was directed into the anterior abdominal mass. Multiple core biopsies were obtained with an 18 gauge core device and specimens were placed in formalin. 17 gauge needle was removed without complication. Bandage placed over the puncture site. FINDINGS: Again noted is large mass in the anterior upper abdomen adjacent to the abdominal wall. This structure measures up to 5.1 cm. Needle position was confirmed within the lesion. Specimens placed in formalin. IMPRESSION: CT-guided core biopsies of the anterior upper abdominal mass. Electronically Signed   By: Markus Daft M.D.   On: 11/28/2021 12:50   DG CHEST PORT 1 VIEW  Result Date: 11/30/2021 CLINICAL DATA:  Cough, prostate cancer, COVID EXAM: PORTABLE CHEST 1 VIEW COMPARISON:  None. FINDINGS: Right Port-A-Cath tip at the cavoatrial junction. Heart is normal size. Left lung clear. Right hilar and mediastinal adenopathy again noted. Peripheral right upper lobe nodule/mass again noted. No effusions. No acute bony abnormality. IMPRESSION: Right upper lobe mass with right hilar and mediastinal adenopathy. Electronically Signed   By:  Rolm Baptise M.D.   On: 11/30/2021 17:32   IR IMAGING GUIDED PORT INSERTION  Result Date: 11/28/2021 INDICATION: 65 year old with evidence of metastatic disease. Port-A-Cath needed for therapy. EXAM: FLUOROSCOPIC AND ULTRASOUND GUIDED PLACEMENT OF A SUBCUTANEOUS PORT COMPARISON:  None. MEDICATIONS: Moderate sedation ANESTHESIA/SEDATION: Versed 2.0 mg IV; Fentanyl 100 mcg IV; Moderate Sedation Time:  44 minutes The patient was continuously monitored during the procedure by the interventional radiology nurse under my direct supervision. FLUOROSCOPY TIME:  24 seconds, 1 mGy COMPLICATIONS: None immediate. PROCEDURE: The procedure, risks, benefits, and alternatives were explained to the patient. Questions regarding the procedure were encouraged and answered. The patient understands and consents to the procedure. Patient was placed supine on the interventional table. Ultrasound confirmed a patent right internal jugular vein. Ultrasound image was saved for documentation. The right chest and neck were cleaned with a skin antiseptic and a sterile drape was placed. Maximal barrier sterile technique was utilized including caps, mask, sterile gowns, sterile gloves, sterile drape, hand hygiene and skin antiseptic. The right neck was anesthetized with 1% lidocaine. Small incision was made in the right neck with a blade. Micropuncture set was placed in the right internal jugular vein with ultrasound guidance. The micropuncture wire was used for measurement purposes. The right chest was anesthetized with 1% lidocaine with epinephrine. #15 blade was used to make an incision and a subcutaneous port pocket was formed. Walker was assembled. Subcutaneous tunnel was formed with a stiff tunneling device. The port catheter was brought through the subcutaneous tunnel. The port was placed in the subcutaneous pocket. The micropuncture set was exchanged for a peel-away sheath. The catheter was placed through the peel-away  sheath and the tip was positioned at the superior cavoatrial junction. Catheter placement was confirmed with fluoroscopy. The port was accessed and flushed  with heparinized saline. The port pocket was closed using two layers of absorbable sutures and Dermabond. The vein skin site was closed using a single layer of absorbable suture and Dermabond. Sterile dressings were applied. Patient tolerated the procedure well without an immediate complication. Ultrasound and fluoroscopic images were taken and saved for this procedure. IMPRESSION: Placement of a subcutaneous power-injectable port device. Catheter tip at the superior cavoatrial junction. Electronically Signed   By: Markus Daft M.D.   On: 11/28/2021 17:48     Future Appointments  Date Time Provider [place]  12/02/2021 12:30 PM [facility] LINAC 3 [facility] None  12/03/2021  4:50 PM [facility] LINAC 3 [facility] None  12/04/2021  4:30 PM [facility] LINAC 4 [facility] None  12/05/2021 12:00 PM [facility] LINAC 4 [facility] None  12/08/2021 12:35 PM [facility] LINAC 4 [facility] None  12/09/2021 12:30 PM [facility] LINAC 3 [facility] None  12/10/2021  3:00 PM [facility] LINAC 3 [facility] None  12/11/2021 12:45 PM [facility] [id number] [facility] None  12/12/2021  1:45 PM [facility] LINAC 3 [facility] None  12/16/2021  2:30 PM [facility] LINAC 3 [facility] None      LOS: 6 days

## 2021-12-02 NOTE — Evaluation (Signed)
Occupational Therapy Evaluation Patient Details Name: Ryan [name] MRN: [id number] DOB: Jun 13, 1956 Today's Date: 12/02/2021   History of Present Illness Patient is a 65 year old male who presented to the hosptial with severe dizziness and disequilibrium. patient was found to have acute hyponatremia, metastatic lung cancer with mets to brain and adrenal glands. PMH: prostate cancer, DM II   Clinical Impression   Patient is a 65 year old male who was living at home with wife with independence in ADLs PLOF. Currently, patient is min A for ADLs with increased time to complete tasks. Patient was noted to have decreased activity tolerance, decreased endurance, decreased standing balance, and decreased strength impacting participation in ADLs. Patient would continue to benefit from skilled OT services at this time while admitted and after d/c to address noted deficits in order to improve overall safety and independence in ADLs.       Recommendations for follow up therapy are one component of a multi-disciplinary discharge planning process, led by the attending physician.  Recommendations may be updated based on patient status, additional functional criteria and insurance authorization.   Follow Up Recommendations  Home health OT    Assistance Recommended at Discharge Frequent or constant Supervision/Assistance  Functional Status Assessment  Patient has had a recent decline in their functional status and demonstrates the ability to make significant improvements in function in a reasonable and predictable amount of time.  Equipment Recommendations  Other (comment) (TBD)    Recommendations for Other Services       Precautions / Restrictions        Mobility Bed Mobility               General bed mobility comments: up in recliner    Transfers Overall transfer level: Needs assistance Equipment used: Rolling walker (2 wheels) Transfers: Sit to/from Stand Sit to Stand: Min guard                   Balance Overall balance assessment: Needs assistance Sitting-balance support: No upper extremity supported;Feet supported Sitting balance-Leahy Scale: Good     Standing balance support: Single extremity supported;During functional activity Standing balance-Leahy Scale: Fair                             ADL either performed or assessed with clinical judgement   ADL Overall ADL's : Needs assistance/impaired                                             Vision Patient Visual Report: No change from baseline       Perception     Praxis      Pertinent Vitals/Pain Pain Assessment: No/denies pain     Hand Dominance (P) Right   Extremity/Trunk Assessment Upper Extremity Assessment Upper Extremity Assessment: Overall WFL for tasks assessed   Lower Extremity Assessment Lower Extremity Assessment: Defer to PT evaluation   Cervical / Trunk Assessment Cervical / Trunk Assessment: Normal   Communication     Cognition Arousal/Alertness: Awake/alert Behavior During Therapy: WFL for tasks assessed/performed Overall Cognitive Status: Within Functional Limits for tasks assessed                                 General Comments: patients wife was present  during session as well. patient was appropriate with tasks during session     General Comments       Exercises     Shoulder Instructions      Home Living Family/patient expects to be discharged to:: (P) Private residence Living Arrangements: (P) Spouse/significant other Available Help at Discharge: (P) Family Type of Home: (P) House Home Access: (P) Stairs to enter Entrance Stairs-Number of Steps: (P) 2 Entrance Stairs-Rails: (P) Right;Left Home Layout: (P) One level     Bathroom Shower/Tub: (P) Tub/shower unit;Curtain   Bathroom Toilet: (P) Standard     Home Equipment: (P) Rolling Walker (2 wheels);Cane - single point          Prior  Functioning/Environment Prior Level of Function : (P) Independent/Modified Independent                        OT Problem List: Decreased strength;Decreased activity tolerance;Impaired balance (sitting and/or standing);Decreased safety awareness;Decreased knowledge of precautions;Decreased knowledge of use of DME or AE      OT Treatment/Interventions: Self-care/ADL training;Therapeutic exercise;Neuromuscular education;Energy conservation;DME and/or AE instruction;Therapeutic activities;Balance training;Patient/family education    OT Goals(Current goals can be found in the care plan section) Acute Rehab OT Goals Patient Stated Goal: to get stronger OT Goal Formulation: With patient/family Time For Goal Achievement: 12/16/21 Potential to Achieve Goals: Good  OT Frequency: Min 2X/week   Barriers to D/C:    wife still works and is not home during the day. family is working on [employer]       Co-evaluation PT/OT/SLP Co-Evaluation/Treatment: Yes Reason for Co-Treatment: For Doctor, hospital PT goals addressed during session: Mobility/safety with mobility OT goals addressed during session: ADL's and self-care      AM-PAC OT "6 Clicks" Daily Activity     Outcome Measure Help from another person eating meals?: A Little Help from another person taking care of personal grooming?: A Little Help from another person toileting, which includes using toliet, bedpan, or urinal?: A Little Help from another person bathing (including washing, rinsing, drying)?: A Little Help from another person to put on and taking off regular upper body clothing?: A Little Help from another person to put on and taking off regular lower body clothing?: A Little 6 Click Score: 18   End of Session Equipment Utilized During Treatment: Gait belt;Rolling walker (2 wheels) Nurse Communication: Other (comment) (cleared patient to participate)  Activity Tolerance: Patient tolerated treatment  well;Patient limited by lethargy Patient left: in chair;with call bell/phone within reach;with family/visitor present  OT Visit Diagnosis: Unsteadiness on feet (R26.81);History of falling (Z91.81)                Time: 4562-5638 OT Time Calculation (min): 18 min Charges:  OT General Charges $OT Visit: 1 Visit OT Evaluation $OT Eval Low Complexity: 1 Low  Leota Sauers, MS Acute Rehabilitation Department Office# [phone] Pager# [phone]   Ryan [name] 12/02/2021, 12:42 PM

## 2021-12-02 NOTE — Evaluation (Addendum)
Physical Therapy Evaluation Patient Details Name: Ryan [name] MRN: [id number] DOB: 02-Oct-1956 Today's Date: 12/02/2021  History of Present Illness  Patient is a 65 year old male who presented to the hosptial with severe dizziness and disequilibrium. patient was found to have acute hyponatremia, metastatic lung cancer with mets to brain and adrenal glands. PMH: prostate cancer, DM II  Clinical Impression  The patient  is resting in  recliner, family present . Patient does present with decreased balance/stability, noted sodium 121 today, which my be affecting.  Patient has been independent and working until recently, requiring support for ADL's and mobility /safety.  Per family, return home with  support. Patient has access to Rw if needed. Pt admitted with above diagnosis.  Pt currently with functional limitations due to the deficits listed below (see PT Problem List). Pt will benefit from skilled PT to increase their independence and safety with mobility to allow discharge to the venue listed below.     HR 100, Possible SPO2 dropped to 88% with quick recovery yo 95%     Recommendations for follow up therapy are one component of a multi-disciplinary discharge planning process, led by the attending physician.  Recommendations may be updated based on patient status, additional functional criteria and insurance authorization.  Follow Up Recommendations Home health PT, recommend 24/7 supervision/assistance.    Assistance Recommended at Discharge Frequent or constant Supervision/Assistance  Functional Status Assessment Patient has had a recent decline in their functional status and demonstrates the ability to make significant improvements in function in a reasonable and predictable amount of time.  Equipment Recommendations  None recommended by PT    Recommendations for Other Services       Precautions / Restrictions Precautions Precautions: Fall Precaution Comments: critical low Na+       Mobility  Bed Mobility               General bed mobility comments: up in recliner    Transfers Overall transfer level: Needs assistance Equipment used: Rolling walker (2 wheels) Transfers: Sit to/from Stand Sit to Stand: Min guard           General transfer comment: close guard.    Ambulation/Gait Ambulation/Gait assistance: Min guard Gait Distance (Feet): 20 Feet (x2 then 50') Assistive device: Rolling walker (2 wheels) Gait Pattern/deviations: Step-to pattern;Step-through pattern;Decreased stride length Gait velocity: decr     General Gait Details: noted legs are weak and  slightly buckling at [employer].  Stairs            Wheelchair Mobility    Modified Rankin (Stroke Patients Only)       Balance Overall balance assessment: Needs assistance;History of Falls Sitting-balance support: No upper extremity supported;Feet supported Sitting balance-Leahy Scale: Good     Standing balance support: Single extremity supported;During functional activity Standing balance-Leahy Scale: Fair                               Pertinent Vitals/Pain Pain Assessment: 0-10    Home Living Family/patient expects to be discharged to:: Private residence Living Arrangements: Spouse/significant other;Other relatives Available Help at Discharge: Family;Available 24 hours/day Type of Home: House Home Access: Stairs to enter Entrance Stairs-Rails: Psychiatric nurse of Steps: 2   Home Layout: One level Home Equipment: Conservation officer, nature (2 wheels);Cane - single point      Prior Function Prior Level of Function : Independent/Modified Independent  Mobility Comments: until recently required supervision, had a fall ADLs Comments: family assisted with ADL's for safety recently     Hand Dominance   Dominant Hand: Right    Extremity/Trunk Assessment   Upper Extremity Assessment Upper Extremity Assessment: Generalized weakness     Lower Extremity Assessment Lower Extremity Assessment: Generalized weakness    Cervical / Trunk Assessment Cervical / Trunk Assessment: Normal  Communication   Communication: Expressive difficulties (voice hoarse from previous  procedure)  Cognition Arousal/Alertness: Awake/alert Behavior During Therapy: WFL for tasks assessed/performed Overall Cognitive Status: Within Functional Limits for tasks assessed                                 General Comments: patients wife was present during session as well. patient was appropriate with tasks during session        General Comments      Exercises     Assessment/Plan    PT Assessment Patient needs continued PT services  PT Problem List Decreased strength;Decreased mobility;Decreased safety awareness;Decreased knowledge of precautions;Decreased activity tolerance;Decreased balance       PT Treatment Interventions DME instruction;Therapeutic activities;Gait training;Therapeutic exercise;Patient/family education;Functional mobility training;Balance training    PT Goals (Current goals can be found in the Care Plan section)  Acute Rehab PT Goals Patient Stated Goal: to go home PT Goal Formulation: With patient/family Time For Goal Achievement: 12/16/21 Potential to Achieve Goals: Good    Frequency Min 3X/week   Barriers to discharge        Co-evaluation PT/OT/SLP Co-Evaluation/Treatment: Yes Reason for Co-Treatment: For patient/therapist safety PT goals addressed during session: Mobility/safety with mobility OT goals addressed during session: ADL's and self-care       AM-PAC PT "6 Clicks" Mobility  Outcome Measure Help needed turning from your back to your side while in a flat bed without using bedrails?: A Little Help needed moving from lying on your back to sitting on the side of a flat bed without using bedrails?: A Little Help needed moving to and from a bed to a chair (including a wheelchair)?: A  Little Help needed standing up from a chair using your arms (e.g., wheelchair or bedside chair)?: A Little Help needed to walk in hospital room?: A Little Help needed climbing 3-5 steps with a railing? : A Lot 6 Click Score: 17    End of Session Equipment Utilized During Treatment: Gait belt Activity Tolerance: Patient limited by fatigue;Patient tolerated treatment well Patient left: in chair;with call bell/phone within reach;with family/visitor present Nurse Communication: Mobility status PT Visit Diagnosis: Unsteadiness on feet (R26.81);Difficulty in walking, not elsewhere classified (R26.2)    Time: 4944-9675 PT Time Calculation (min) (ACUTE ONLY): 14 min   Charges:   PT Evaluation $PT Eval Low Complexity: [place] [facility] Acute Rehabilitation Services Pager [phone] Office [phone]   Claretha Cooper 12/02/2021, 1:43 PM

## 2021-12-02 NOTE — Progress Notes (Signed)
START ON PATHWAY REGIMEN - Small Cell Lung     A cycle is every 21 days:     Carboplatin      Etoposide   **Always confirm dose/schedule in your pharmacy ordering system**  Patient Characteristics: Newly Diagnosed, Preoperative or Nonsurgical Candidate (Clinical Staging), First Line, Extensive Stage Therapeutic Status: Newly Diagnosed, Preoperative or Nonsurgical Candidate (Clinical Staging) AJCC T Category: cT4 AJCC N Category: cN3 AJCC M Category: pM1 AJCC 8 Stage Grouping: IV Stage Classification: Extensive  Intent of Therapy: Non-Curative / Palliative Intent, Discussed with Patient

## 2021-12-03 ENCOUNTER — Telehealth: Payer: Self-pay | Admitting: Hematology and Oncology

## 2021-12-03 ENCOUNTER — Ambulatory Visit
Admit: 2021-12-03 | Discharge: 2021-12-03 | Disposition: A | Discharge status: Home / Self Care | Payer: BC Managed Care – PPO | Attending: Radiation Oncology | Admitting: Radiation Oncology

## 2021-12-03 ENCOUNTER — Other Ambulatory Visit: Payer: Self-pay | Admitting: Hematology and Oncology

## 2021-12-03 LAB — BASIC METABOLIC PANEL
Anion gap: 11 (ref 5–15)
BUN: 15 mg/dL (ref 8–23)
CO2: 22 mmol/L (ref 22–32)
Calcium: 9.3 mg/dL (ref 8.9–10.3)
Chloride: 96 mmol/L — ABNORMAL LOW (ref 98–111)
Creatinine, Ser: 0.53 mg/dL — ABNORMAL LOW (ref 0.61–1.24)
GFR, Estimated: >60 mL/min (ref >60)
Glucose, Bld: 163 mg/dL — ABNORMAL HIGH (ref 70–99)
Potassium: 4.1 mmol/L (ref 3.5–5.1)
Sodium: 129 mmol/L — ABNORMAL LOW (ref 135–145)

## 2021-12-03 LAB — HEPATIC FUNCTION PANEL
ALT: 39 U/L (ref 0–44)
AST: 29 U/L (ref 15–41)
Albumin: 3.5 g/dL (ref 3.5–5.0)
Alkaline Phosphatase: 68 U/L (ref 38–126)
Bilirubin, Direct: 0.2 mg/dL (ref 0.0–0.2)
Indirect Bilirubin: 0.6 mg/dL (ref 0.3–0.9)
Total Bilirubin: 0.8 mg/dL (ref 0.3–1.2)
Total Protein: 6.9 g/dL (ref 6.5–8.1)

## 2021-12-03 LAB — CBC
HCT: 38.5 % — ABNORMAL LOW (ref 39.0–52.0)
Hemoglobin: 13 g/dL (ref 13.0–17.0)
MCH: 31.3 pg (ref 26.0–34.0)
MCHC: 33.8 g/dL (ref 30.0–36.0)
MCV: 92.5 fL (ref 80.0–100.0)
Platelets: 205 10*3/uL (ref 150–400)
RBC: 4.16 MIL/uL — ABNORMAL LOW (ref 4.22–5.81)
RDW: 13.4 % (ref 11.5–15.5)
WBC: 12.6 10*3/uL — ABNORMAL HIGH (ref 4.0–10.5)
nRBC: 0 % (ref 0.0–0.2)

## 2021-12-03 LAB — URIC ACID: Uric Acid, Serum: 2.7 mg/dL — ABNORMAL LOW (ref 3.7–8.6)

## 2021-12-03 LAB — LACTATE DEHYDROGENASE: LDH: 275 U/L — ABNORMAL HIGH (ref 98–192)

## 2021-12-03 LAB — GLUCOSE, CAPILLARY
Glucose-Capillary: 180 mg/dL — ABNORMAL HIGH (ref 70–99)
Glucose-Capillary: 244 mg/dL — ABNORMAL HIGH (ref 70–99)
Glucose-Capillary: 219 mg/dL — ABNORMAL HIGH (ref 70–99)
Glucose-Capillary: 279 mg/dL — ABNORMAL HIGH (ref 70–99)

## 2021-12-03 LAB — CORTISOL: Cortisol, Plasma: 6.2 ug/dL

## 2021-12-03 LAB — TSH: TSH: 2.112 u[IU]/mL (ref 0.350–4.500)

## 2021-12-03 LAB — SODIUM: Sodium: 130 mmol/L — ABNORMAL LOW (ref 135–145)

## 2021-12-03 MED ORDER — PROCHLORPERAZINE MALEATE 10 MG PO TABS
10.0000 mg | ORAL_TABLET | Freq: Four times a day (QID) | ORAL | Status: DC | PRN
  Filled 2021-12-03: qty 1

## 2021-12-03 MED ORDER — METHOCARBAMOL 500 MG PO TABS
500.0000 mg | ORAL_TABLET | Freq: Three times a day (TID) | ORAL | Status: DC | PRN
  Administered 2021-12-04: 500 mg via ORAL
  Filled 2021-12-03: qty 1

## 2021-12-03 MED ORDER — HEPARIN SOD (PORK) LOCK FLUSH 100 UNIT/ML IV SOLN
500.0000 [IU] | Freq: Once | INTRAVENOUS | Status: AC | PRN
  Administered 2021-12-05: 500 [IU]
  Filled 2021-12-03 (×2): qty 5

## 2021-12-03 MED ORDER — SODIUM CHLORIDE 1 G PO TABS
1.0000 g | ORAL_TABLET | Freq: Every day | ORAL | Status: DC
Start: 2021-12-03 — End: 2021-12-04
  Administered 2021-12-03 – 2021-12-04 (×2): 1 g via ORAL
  Filled 2021-12-03 (×2): qty 1

## 2021-12-03 MED ORDER — SODIUM CHLORIDE 0.9 % IV SOLN
150.0000 mg | Freq: Once | INTRAVENOUS | Status: AC
  Administered 2021-12-03: 13:00:00 150 mg via INTRAVENOUS
  Filled 2021-12-03: qty 5

## 2021-12-03 MED ORDER — SODIUM CHLORIDE 0.9 % IV SOLN
10.0000 mg | Freq: Once | INTRAVENOUS | Status: AC
  Administered 2021-12-03: 12:00:00 10 mg via INTRAVENOUS
  Filled 2021-12-03: qty 1

## 2021-12-03 MED ORDER — PALONOSETRON HCL INJECTION 0.25 MG/5ML
0.2500 mg | Freq: Once | INTRAVENOUS | Status: AC
  Administered 2021-12-03: 12:00:00 0.25 mg via INTRAVENOUS
  Filled 2021-12-03: qty 5

## 2021-12-03 MED ORDER — SODIUM CHLORIDE 0.9 % IV SOLN
665.5000 mg | Freq: Once | INTRAVENOUS | Status: AC
  Administered 2021-12-03: 13:00:00 670 mg via INTRAVENOUS
  Filled 2021-12-03: qty 67

## 2021-12-03 MED ORDER — SODIUM CHLORIDE 0.9% FLUSH: 10.0000 mL | INTRAVENOUS | Status: DC | PRN

## 2021-12-03 MED ORDER — SODIUM CHLORIDE 0.9 % IV SOLN
100.0000 mg/m2 | Freq: Once | INTRAVENOUS | Status: AC
  Administered 2021-12-03: 14:00:00 240 mg via INTRAVENOUS
  Filled 2021-12-03: qty 12

## 2021-12-03 MED ORDER — FUROSEMIDE 20 MG PO TABS
10.0000 mg | ORAL_TABLET | Freq: Two times a day (BID) | ORAL | Status: DC
  Administered 2021-12-03 – 2021-12-04 (×3): 10 mg via ORAL
  Filled 2021-12-03 (×3): qty 1

## 2021-12-03 MED ORDER — SODIUM CHLORIDE 0.9% FLUSH
10.0000 mL | Freq: Two times a day (BID) | INTRAVENOUS | Status: DC
  Administered 2021-12-03 – 2021-12-05 (×5): 10 mL

## 2021-12-03 MED ORDER — SODIUM CHLORIDE 0.9 % IV SOLN: Freq: Once | INTRAVENOUS | Status: AC

## 2021-12-03 MED ORDER — ALLOPURINOL 300 MG PO TABS
300.0000 mg | ORAL_TABLET | Freq: Every day | ORAL | Status: DC
  Administered 2021-12-03 – 2021-12-05 (×3): 300 mg via ORAL
  Filled 2021-12-03: qty 3
  Filled 2021-12-03 (×2): qty 1

## 2021-12-03 MED ORDER — SODIUM CHLORIDE 1 G PO TABS
2.0000 g | ORAL_TABLET | Freq: Two times a day (BID) | ORAL | Status: DC
  Administered 2021-12-03 – 2021-12-04 (×3): 2 g via ORAL
  Filled 2021-12-03 (×5): qty 2

## 2021-12-03 NOTE — Plan of Care (Signed)

## 2021-12-03 NOTE — Progress Notes (Signed)
PROGRESS NOTE    Ryan [name]  STM:[id number] DOB: [birth year] DOA: 11/26/2021  PCP: [name], DO   Brief Narrative:  65 years old male with PMH significant for prostate cancer, type 2 diabetes presented in the ED with 3 weeks history of progressive weakness, decreased appetite, 20 pound weight loss.  Patient also been experiencing episodes of severe dizziness and disequilibrium. He reports he was diagnosed with COVID 3 weeks ago.  Work-up in the ED shows sodium 118.  CT chest showed 2.9 cm lobulated noncalcified pleural-based nodule in the right upper lobe suggesting possible primary malignant neoplasm.  There is multiple lesions noted in the brain, adrenal gland and nearby lymph nodes suggesting metastasis. Also found to have acute hyponatremia that could be secondary to SIADH in the setting of new onset metastatic lung cancer.  Sodium is slowly improving with fluid restriction.  Oncology is consulted recommended dexamethasone, MRI brain confirms mets.  Patient underwent CT-guided biopsy of abdominal peritoneal mass.  Patient also underwent Port-A-Cath placement for chemotherapy. Patient started radiation treatment and chemotherapy.  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 -slowly improving with fluid restriction. -Nephrology consulted  Metastatic lung CA with mets in the brain, adrenal gland: Lung nodule and multiple lesions in brain, adrenal gland and nearby lymph node concerning for metastasis with primary lung carcinoma until proven otherwise. IR consulted for biopsy of peritoneal lesion. MRI brain confirms the metastatic lesions in the brain.Invasion and obliteration of the posterior superior sagittal sinus by the above described dural based mass. Patient  underwent successful CT-guided biopsy of the abdominal mass, He also underwent Port-A-Cath placement by IR on 12/9. Patient started radiation treatment on 12/12.   -chemo started 12/14 -Patient will need outpatient PET/CT imaging.  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 -should get better with radiation.  History of prostate cancer: S/p prostatectomy with normal PSA.  Diabetes mellitus: -SSI -suspect will be high while on steroids  Essential hypertension: Hold Lisinopril. Continue amlodipine 10 mg daily.  Constipation: Continue senna and Colace. MiraLAX daily.  Goals of care discussion.: Patient does have stage IV lung cancer with metastasis.  Chemotherapy would be palliative in nature. -defer to oncology  DVT prophylaxis:  SCDs Code Status: Full code. Family Communication: Wife in the room. Disposition Plan:  Status is: Inpatient  Remains inpatient appropriate because:    Anticipated discharge home later Friday or early Saturday  Consultants:  Oncology, radiation oncology  Procedures: CT guided biopsy of the peritoneal mass.  Port a cath placement  Antimicrobials:   Anti-infectives (From admission, onward)    None       Subjective: Anxious about radiation treatment   Objective: Vitals:   12/03/21 0800 12/03/21 0909 12/03/21 1041 12/03/21 1100  BP:   (!) 163/83   Pulse:      Resp:      Temp: 97.7 F (36.5 C)   97.7 F (36.5 C)  TempSrc: Oral   Oral  SpO2:      Weight:  102.2 kg    Height:        Intake/Output Summary (Last 24 hours) at 12/03/2021 1337 Last data filed at 12/03/2021 1044 Gross per 24 hour  Intake 10 ml  Output --  Net 10 ml  Filed Weights   11/26/21 0924 12/03/21 0909  Weight: 106.6 kg 102.2 kg    Examination:   General: Appearance:     Overweight male in no acute distress     Lungs:     respirations unlabored  Heart:    Normal heart rate.   MS:   All extremities are intact.     Neurologic:   Awake, alert, pleasant and cooperative       Data Reviewed: I have personally reviewed following labs and imaging studies  CBC: Recent Labs  Lab 11/29/21 0325 12/02/21 0556 12/03/21 0036  WBC 6.7 15.5* 12.6*  HGB 12.2* 12.9* 13.0  HCT 37.0* 38.0* 38.5*  MCV 95.1 91.6 92.5  PLT 231 186 545   Basic Metabolic Panel: Recent Labs  Lab 11/29/21 0325 11/30/21 0330 12/01/21 0314 12/01/21 0945 12/02/21 0556 12/02/21 1815 12/03/21 0036 12/03/21 0805  NA 124* 126* 121* 121* 121* 126* 129* 130*  K 4.2 4.0 4.2 4.0 3.9  --  4.1  --   CL 91* 92* 90* 90* 89*  --  96*  --   CO2 21* 23 19* 22 21*  --  22  --   GLUCOSE 140* 163* 155* 196* 167*  --  163*  --   BUN 19 16 16 16 16   --  15  --   CREATININE 0.53* 0.48* 0.45* 0.53* 0.47*  --  0.53*  --   CALCIUM 9.0 9.2 9.2 8.8* 8.9  --  9.3  --   MG 2.0  --  1.9  --   --   --   --   --   PHOS 3.2  --  3.4  --   --   --   --   --    GFR: Estimated Creatinine Clearance: 114.3 mL/min (A) (by C-G formula based on SCr of 0.53 mg/dL (L)). Liver Function Tests: Recent Labs  Lab 12/02/21 0556 12/03/21 0036  AST 28 29  ALT 31 39  ALKPHOS 65 68  BILITOT 0.9 0.8  PROT 7.2 6.9  ALBUMIN 3.6 3.5   No results for input(s): LIPASE, AMYLASE in the last 168 hours.  No results for input(s): AMMONIA in the last 168 hours. Coagulation Profile: Recent Labs  Lab 11/28/21 0023  INR 0.9   Cardiac Enzymes: No results for input(s): CKTOTAL, CKMB, CKMBINDEX, TROPONINI in the last 168 hours. BNP (last 3 results) No results for input(s): PROBNP in the last 8760 hours. HbA1C: No results for input(s): HGBA1C in the last 72 hours.  CBG: Recent Labs  Lab 12/02/21 1311 12/02/21 1626 12/02/21 2157 12/03/21 0743 12/03/21 1146  GLUCAP 172* 175* 168* 180* 244*   Lipid Profile: No results for input(s): CHOL, HDL, LDLCALC, TRIG, CHOLHDL, LDLDIRECT in the last 72 hours. Thyroid Function Tests: Recent Labs    12/02/21 0547  TSH  2.112   Anemia Panel: No results for input(s): VITAMINB12, FOLATE, FERRITIN, TIBC, IRON, RETICCTPCT in the last 72 hours. Sepsis Labs: No results for input(s): PROCALCITON, LATICACIDVEN in the last 168 hours.  Recent Results (from the past 240 hour(s))  Resp Panel by RT-PCR (Flu A&B, Covid) Nasopharyngeal Swab     Status: Abnormal   Collection Time: 11/26/21  4:04 PM   Specimen: Nasopharyngeal Swab; Nasopharyngeal(NP) swabs in vial transport medium  Result Value Ref Range Status   SARS Coronavirus 2 by RT PCR POSITIVE (A) NEGATIVE Final    Comment: RESULT CALLED TO, READ BACK BY AND VERIFIED WITH: Raynelle Bring RN @1646   11/26/2021 OLSONM (NOTE) SARS-CoV-2 target nucleic acids are DETECTED.  The SARS-CoV-2 RNA is generally detectable in upper respiratory specimens during the acute phase of infection. Positive results are indicative of the presence of the identified virus, but do not rule out bacterial infection or co-infection with other pathogens not detected by the test. Clinical correlation with patient history and other diagnostic information is necessary to determine patient infection status. The expected result is Negative.  Fact Sheet for Patients: EntrepreneurPulse.com.au  Fact Sheet for Healthcare Providers: IncredibleEmployment.be  This test is not yet approved or cleared by the [place] FDA and  has been authorized for detection and/or diagnosis of SARS-CoV-2 by FDA under an Emergency Use Authorization (EUA).  This EUA will remain in effect (meaning this test  can be used) for the duration of  the COVID-19 declaration under Section 564(b)(1) of the Act, 21 U.S.C. section 360bbb-3(b)(1), unless the authorization is terminated or revoked sooner.     Influenza A by PCR NEGATIVE NEGATIVE Final   Influenza B by PCR NEGATIVE NEGATIVE Final    Comment: (NOTE) The Xpert Xpress SARS-CoV-2/FLU/RSV plus assay is intended as an aid in  the diagnosis of influenza from Nasopharyngeal swab specimens and should not be used as a sole basis for treatment. Nasal washings and aspirates are unacceptable for Xpert Xpress SARS-CoV-2/FLU/RSV testing.  Fact Sheet for Patients: EntrepreneurPulse.com.au  Fact Sheet for Healthcare Providers: IncredibleEmployment.be  This test is not yet approved or cleared by the [place] FDA and has been authorized for detection and/or diagnosis of SARS-CoV-2 by FDA under an Emergency Use Authorization (EUA). This EUA will remain in effect (meaning this test can be used) for the duration of the COVID-19 declaration under Section 564(b)(1) of the Act, 21 U.S.C. section 360bbb-3(b)(1), unless the authorization is terminated or revoked.  Performed at [facility], [address] [place], [place] [address]   MRSA Next Gen by PCR, Nasal     Status: None   Collection Time: 11/26/21 11:44 PM   Specimen: Nasal Mucosa; Nasal Swab  Result Value Ref Range Status   MRSA by PCR Next Gen NOT DETECTED NOT DETECTED Final    Comment: (NOTE) The GeneXpert MRSA Assay (FDA approved for NASAL specimens only), is one component of a comprehensive MRSA colonization surveillance program. It is not intended to diagnose MRSA infection nor to guide or monitor treatment for MRSA infections. Test performance is not FDA approved in patients less than 80 years old. Performed at [facility], [place] [address], [place], [place] [address]     Radiology Studies: No results found.   Scheduled Meds:  (feeding supplement) PROSource Plus  30 mL Oral Daily   allopurinol  300 mg Oral Daily   amLODipine  10 mg Oral Daily   Chlorhexidine Gluconate Cloth  6 each Topical Daily   dexamethasone (DECADRON) injection  4 mg Intravenous Q8H   docusate sodium  100 mg Oral BID   enoxaparin (LOVENOX) injection  40 mg Subcutaneous Daily   etoposide  100 mg/m2  (Treatment Plan Recorded) Intravenous Once   feeding supplement  1 Container Oral Q24H   feeding supplement  237 mL Oral BID BM   furosemide  10 mg Oral BID   insulin aspart  0-15 Units Subcutaneous TID WC   mouth rinse  15 mL Mouth Rinse BID   memantine  5 mg Oral q AM   multivitamin with minerals  1 tablet Oral Daily   nicotine  7  mg Transdermal Daily   pantoprazole  40 mg Oral Daily   polyethylene glycol  17 g Oral Daily   sodium chloride flush  10-40 mL Intracatheter Q12H   sodium chloride flush  10-40 mL Intracatheter Q12H   sodium chloride  1 g Oral Q lunch   sodium chloride  2 g Oral BID WC   Continuous Infusions:   LOS: 7 days    Time spent: 25 mins    Geradine Girt, DO [facility]   If 7PM-7AM, please contact night-coverage

## 2021-12-03 NOTE — Plan of Care (Signed)
Pain managed this shift with PRNs including Tylenol, Ephraim Hamburger and chloraseptic spray.  Pt now off tele monitoring and awaiting a med surg bed.  Pt given ambien and ativan with good effect - pt states he feels more rested than he has in days. Problem: Education: Goal: Knowledge of General Education information will improve Description: Including pain rating scale, medication(s)/side effects and non-pharmacologic comfort measures Outcome: Progressing   Problem: Health Behavior/Discharge Planning: Goal: Ability to manage health-related needs will improve Outcome: Progressing   Problem: Clinical Measurements: Goal: Ability to maintain clinical measurements within normal limits will improve Outcome: Progressing Goal: Will remain free from infection Outcome: Progressing Goal: Diagnostic test results will improve Outcome: Progressing Goal: Respiratory complications will improve Outcome: Progressing Goal: Cardiovascular complication will be avoided Outcome: Progressing   Problem: Activity: Goal: Risk for activity intolerance will decrease Outcome: Progressing   Problem: Nutrition: Goal: Adequate nutrition will be maintained Outcome: Progressing   Problem: Coping: Goal: Level of anxiety will decrease Outcome: Progressing   Problem: Elimination: Goal: Will not experience complications related to bowel motility Outcome: Progressing Goal: Will not experience complications related to urinary retention Outcome: Progressing   Problem: Pain Managment: Goal: General experience of comfort will improve Outcome: Progressing   Problem: Safety: Goal: Ability to remain free from injury will improve Outcome: Progressing   Problem: Skin Integrity: Goal: Risk for impaired skin integrity will decrease Outcome: Progressing

## 2021-12-03 NOTE — Progress Notes (Signed)
PT Cancellation Note  Patient Details Name: Ryan [name] MRN: [id number] DOB: Feb 02, 1956   Cancelled Treatment:    Reason Eval/Treat Not Completed: Patient at procedure or test/unavailable, receiving chemo. Will check back another time. [place] Pager [phone] Office [phone]    Claretha Cooper 12/03/2021, 3:14 PM

## 2021-12-03 NOTE — Progress Notes (Signed)
[facility] Progress Note  Subjective: no UOP recorded overnight. Na+ up to 126 last night and 130 this morning.   Vitals:   12/02/21 2000 12/02/21 2015 12/03/21 0000 12/03/21 0800  BP: (!) 174/94 138/88    Pulse: 98     Resp: 20     Temp: 97.9 F (36.6 C)  98.7 F (37.1 C) 97.7 F (36.5 C)  TempSrc: Axillary  Oral Oral  SpO2: 96%     Weight:      Height:        Exam: Gen alert, no distress, large man No jvd or bruits Chest clear bilat to bases RRR no MRG Abd soft ntnd no mass or ascites +bs Ext 1+ RUE edema, no LE edema Neuro is alert, Ox 3 , nonfocal      Home meds include - liptor, metformin, amvien, zestril, namenda, novolog 70-30 not clear dosing, prns/ supps/ vits      UNa 38, UOsm 343    UA 12/7 - wnl    BP's are high 160- 197/ 70- 90, hR 80-90,  RR 15-18, afeb     Na 121  CO2 21  BUN 16  cr 0.47   WBC 15K  Hb 12.9       Assessment/ Plan: Hyponatremia - pt is euvolemic, no signs or hx of renal / liver/ heart failure.  TSH and am cortisol pending. 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  No meds implicated. SP tolvaptan x 1 12/13 yesterday. Na+ better today at 130. Will start low dose po lasix, salt tabs and cont fluid restrict to 1000 cc / day. F/u labs in am tomorrow.  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 Per ONC plan is for inpatient chemoRx. Will follow.  Hx prostate cancer -sp prostatectomy DM2          Rob Nyleah Mcginnis 12/03/2021, 9:06 AM   Recent Labs  Lab 11/29/21 0325 11/30/21 0330 12/01/21 0314 12/01/21 0945 12/02/21 0556 12/03/21 0036  K 4.2   < > 4.2   < > 3.9 4.1  BUN 19   < > 16   < > 16 15  CREATININE 0.53*   < > 0.45*   < > 0.47* 0.53*  CALCIUM 9.0   < > 9.2   < > 8.9 9.3  PHOS 3.2  --  3.4  --   --   --   HGB 12.2*  --   --   --  12.9* 13.0   < > = values in this interval not displayed.   Inpatient medications:  (feeding supplement)  PROSource Plus  30 mL Oral Daily   amLODipine  10 mg Oral Daily   Chlorhexidine Gluconate Cloth  6 each Topical Daily   dexamethasone (DECADRON) injection  4 mg Intravenous Q8H   docusate sodium  100 mg Oral BID   enoxaparin (LOVENOX) injection  40 mg Subcutaneous Daily   feeding supplement  1 Container Oral Q24H   feeding supplement  237 mL Oral BID BM   insulin aspart  0-15 Units Subcutaneous TID WC   mouth rinse  15 mL Mouth Rinse BID   memantine  5 mg Oral q AM   multivitamin with minerals  1 tablet Oral Daily   nicotine  7 mg Transdermal Daily   pantoprazole  40 mg Oral Daily   polyethylene glycol  17 g Oral Daily   sodium chloride flush  10-40 mL Intracatheter Q12H    acetaminophen **OR** acetaminophen, alum & mag hydroxide-simeth, calcium carbonate, diphenhydrAMINE, guaiFENesin-dextromethorphan, levalbuterol, LORazepam, Muscle Rub, ondansetron **OR** ondansetron (ZOFRAN) IV, phenol, sodium chloride flush, zolpidem

## 2021-12-03 NOTE — Progress Notes (Signed)
Ryan [name]   DOB:02-10-1956   VW#:[id number]    ASSESSMENT & PLAN:   Extensive stage high-grade neuroendocrine carcinoma, small cell type Imaging findings discussed with the patient and his significant other IWe discussed the risks, benefits, side effects of carboplatin with etoposide Given the severity of his disease and life-threatening situation, I recommend inpatient chemotherapy We will try to get him moved to 6 E. 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  We will discontinue telemetry I explained to the patient the rationale of not discharging him until his sodium is consistently stable over 130 due to high risk of arrhythmia and seizures Unfortunately, this will not likely to improve until he gets started on systemic treatment   History of prostate cancer Status post prostatectomy with normal PSA in October 2022 He is followed as an outpatient by his primary care provider/urology   Diabetes mellitus On sliding scale insulin Management per hospitalist   Constipation Continue Colace 100 mg twice daily and add MiraLAX daily   Goals of care discussion We discussed biopsy results today and the incurable nature of his disease We discussed treatment will palliative in nature   Discharge plan The patient needs to stay in the hospital due to safety and high risk of seizures with significant hyponatremia I am hopeful he can be moved to 6 E. to start chemotherapy as soon as possible I will also arrange for outpatient follow-up next week He would need G-CSF support after discharge I will see him again tomorrow All questions were answered. The patient  knows to call the clinic with any problems, questions or concerns.   The total time spent in the appointment was 40 minutes encounter with patients including review of chart and various tests results, discussions about plan of care and coordination of care plan  [name], MD 12/03/2021 9:01 AM  Subjective:  He is better today.  He is sitting up eating breakfast He appears less confused He still appears somewhat agitated and repeatedly ruminating about the possibility of going home soon His fiance by the bedside  Objective:  Vitals:   12/02/21 2015 12/03/21 0000  BP: 138/88   Pulse:    Resp:    Temp:  98.7 F (37.1 C)  SpO2:      No intake or output data in the 24 hours ending 12/03/21 0901  GENERAL:alert, no distress and comfortable NEURO: alert & oriented x 3 with fluent speech, no focal motor/sensory deficits   Labs:  Recent Labs    11/26/21 1130 11/26/21 1329 12/01/21 0945 12/02/21 0556 12/02/21 1815 12/03/21 0036 12/03/21 0805  NA 118*   < > 121* 121* 126* 129* 130*  K 4.2   < > 4.0 3.9  --  4.1  --   CL 86*   < > 90* 89*  --  96*  --   CO2 23   < > 22 21*  --  22  --   GLUCOSE 119*   < > 196* 167*  --  163*  --   BUN 12   < > 16 16  --  15  --   CREATININE 0.43*   < >  0.53* 0.47*  --  0.53*  --   CALCIUM 8.8*   < > 8.8* 8.9  --  9.3  --   GFRNONAA >60   < > >60 >60  --  >60  --   PROT 7.1  --   --  7.2  --  6.9  --   ALBUMIN 3.7  --   --  3.6  --  3.5  --   AST 27  --   --  28  --  29  --   ALT 15  --   --  31  --  39  --   ALKPHOS 69  --   --  65  --  68  --   BILITOT 0.6  --   --  0.9  --  0.8  --   BILIDIR  --   --   --  0.2  --  0.2  --   IBILI  --   --   --  0.7  --  0.6  --    < > = values in this interval not displayed.    Studies:  CT Head Wo Contrast  Result Date: 11/26/2021 CLINICAL DATA:  Dizziness and disequilibrium. Symptoms for 1 month. COVID 3 weeks ago. Prostate cancer with new lung mass. EXAM: CT HEAD WITHOUT CONTRAST TECHNIQUE:  Contiguous axial images were obtained from the base of the skull through the vertex without intravenous contrast. COMPARISON:  None. FINDINGS: Brain: A midline extra-axial hyperdense mass lesion surrounds the superior sagittal sinus. The lesion measures 5.9 x 3.5 x 4.4 cm. An area of hypoattenuation is present in the posterior right frontal lobe adjacent to the lesion. There is mass effect on the posterior frontal and parietal lobes bilaterally. Separate hyperdense focus is present in the cortical or subcortical left parietal lobe on axial image 24 of series 5. Similar subcortical hyperdensity is present in the left occipital lobe on axial image 17 of series 5. Areas of hypoattenuation are present in the cerebellum. The largest is in midline with some surrounding hyperdensity, also concerning for a metastatic lesion. It measures 2.2 x 2.1 cm on saddle sagittal image 29 of series 4. Smaller hypodense left cystic lesions the cerebellum are also concerning metastases. Infarct is considered less likely. A hypodense lesion in the right temporal lobe measures 11 mm. The ventricles are of normal size.  No significant fluid is present Vascular: No hyperdense vessel or unexpected calcification. Skull: Calvarium is intact. No focal lytic or blastic lesions are present. No significant extracranial soft tissue lesion is present. Sinuses/Orbits: Right maxillary sinus opacification is likely chronic. There is some wall thickening. Minimal air is present in the sinus. The paranasal sinuses and mastoid air cells are otherwise clear. The globes and orbits are within normal limits. IMPRESSION: 1. 5.9 x 3.5 x 4.4 cm midline extra-axial hyperdense mass lesion surrounds the superior sagittal sinus. This most likely represents a metastasis. Meningioma is considered less likely. 2. Other hyperdense lesions are present in the cortical or subcortical left parietal lobe, left occipital lobe, and right temporal lobe concerning for metastatic  disease. 3. There is mass effect on the posterior frontal and parietal lobes bilaterally. 4. Recommend MRI the brain without and contrast for further evaluation of these lesions. 5. Chronic right maxillary sinus disease. These results were called by telephone at the time of interpretation on 11/26/2021 at 1:30 pm to provider Dr. Melina Copa, who verbally acknowledged these results. Electronically Signed   By: Harrell Gave  Mattern M.D.   On: 11/26/2021 13:46   MR Brain W and Wo Contrast  Result Date: 11/28/2021 CLINICAL DATA:  Initial evaluation for brain mass. EXAM: MRI HEAD WITHOUT AND WITH CONTRAST MRV HEAD WITHOUT CONTRAST TECHNIQUE: Multiplanar, multiecho pulse sequences of the brain and surrounding structures were obtained without and with intravenous contrast. Angiographic images of the intracranial venous structures were obtained using MRV technique without intravenous contrast. CONTRAST:  9mL GADAVIST GADOBUTROL 1 MMOL/ML IV SOLN COMPARISON:  Prior CT from 11/26/2021. FINDINGS: MRI HEAD FINDINGS: Brain: Cerebral volume within normal limits. Multiple scattered intraparenchymal lesions are seen involving both cerebral hemispheres as well as the cerebellum, consistent with widespread metastatic disease. There are at least 20 lesions. For reference purposes, the largest lesion within the right cerebral hemisphere is present at the right temporal lobe in measures 1.5 x 1.2 x 0.7 cm (series 26, image 22). The largest lesion within the left cerebral hemisphere is positioned at the left occipital pole and measures 1.5 x 1.7 x 1.7 cm (series 26, image 21). Largest distinct infratentorial lesion is seen within the central cerebellum and measures 2.1 x 2.2 x 2.5 cm (series 26, image 12). Several of these lesions are somewhat cystic in appearance. Few lesions demonstrate intrinsic T1 hyperintensity and/or SWI signal, consistent with hemorrhage and/or blood products, most notable at the central cerebellum (series 11,  image 11). Mild localized edema about several of these lesions without significant regional mass effect. An additional large enhancing extra-axial mass positioned at the posterior aspect of the superior sagittal sinus measures 3.2 x 6.6 x 4.8 cm (series 28, image 13). Scattered areas of internal susceptibility artifact consistent with blood products. Mild localized vasogenic edema without significant mass effect. This lesion is somewhat indeterminate, and could reflect an additional dural-based metastasis. Concomitant meningioma could also be considered. This lesion invades and obliterates the adjacent superior sagittal sinus, better evaluated on concomitant MRV. Mild diffuse smooth pachymeningeal thickening and enhancement is likely secondary. No definite evidence for acute or subacute ischemia. Note made of a punctate focus of diffusion abnormality at the right basal ganglia, favored to reflect a small metastasis rather than infarct (series 5, image 75). No midline shift or hydrocephalus. No other extra-axial fluid collection. Pituitary gland suprasellar region within normal limits. Vascular: Major intracranial arterial vascular flow voids are maintained. Skull and upper cervical spine: Craniocervical junction within normal limits. Heterogeneous signal intensity seen within the visualized bone marrow without focal marrow replacing lesion. No scalp soft tissue abnormality. Sinuses/Orbits: Globes within normal limits. Increased CSF signal intensity seen along the optic nerve sheaths bilaterally, suggesting elevated intracranial pressures. Orbital soft tissues otherwise unremarkable. Moderate mucosal thickening within the right maxillary sinus. Paranasal sinuses are otherwise largely clear. Small right with trace left mastoid effusions. Other: None. MRV HEAD FINDINGS: The above described dural based mass again noted at the posterior aspect of the superior sagittal sinus. The mass invades and obliterates the superior  sagittal sinus at this level (series 1, image 98). Tubular filling defect is seen within the superior sagittal sinus anterior to the mass, likely a small amount of nonocclusive thrombus (series 13, images 88, 102). Superior sagittal sinus otherwise patent anteriorly. Additional extension of the lesion to involve the posterior aspect of the suture superior sagittal sinus as it courses towards the torcula (series 12, image 82) the torcula itself is patent. Transverse and sigmoid sinuses are patent as are the visualized proximal internal jugular veins. Straight sinus, vein of Galen, inferior sagittal sinus, internal cerebral veins,  and basal veins of Rosenthal are patent. No abnormality about the cavernous sinus. Superior orbital veins symmetric and within normal limits. No appreciable cortical venous thrombosis. IMPRESSION: MRI HEAD IMPRESSION: 1. 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 Mild localized edema with hemorrhagic blood products about several of these lesions without significant regional mass effect or midline shift. 2. 3.2 x 6.6 x 4.8 cm enhancing extra-axial mass at the posterior aspect of the superior sagittal sinus, indeterminate. While this finding could reflect an additional dural-based metastasis, a possible concomitant meningioma could also be considered. MRV HEAD IMPRESSION: 1. Invasion and obliteration of the posterior superior sagittal sinus by the above described dural based mass. Small amount of additional nonocclusive thrombus within the superior sagittal sinus anterior to the mass as above. 2. Remainder of the major dural sinuses are otherwise patent. Electronically Signed   By: Jeannine Boga M.D.   On: 11/28/2021 00:09   MR Venogram Head  Result Date: 11/28/2021 CLINICAL DATA:  Initial evaluation for brain mass. EXAM: MRI HEAD WITHOUT AND WITH CONTRAST MRV HEAD WITHOUT CONTRAST TECHNIQUE: Multiplanar, multiecho pulse  sequences of the brain and surrounding structures were obtained without and with intravenous contrast. Angiographic images of the intracranial venous structures were obtained using MRV technique without intravenous contrast. CONTRAST:  71mL GADAVIST GADOBUTROL 1 MMOL/ML IV SOLN COMPARISON:  Prior CT from 11/26/2021. FINDINGS: MRI HEAD FINDINGS: Brain: Cerebral volume within normal limits. Multiple scattered intraparenchymal lesions are seen involving both cerebral hemispheres as well as the cerebellum, consistent with widespread metastatic disease. There are at least 20 lesions. For reference purposes, the largest lesion within the right cerebral hemisphere is present at the right temporal lobe in measures 1.5 x 1.2 x 0.7 cm (series 26, image 22). The largest lesion within the left cerebral hemisphere is positioned at the left occipital pole and measures 1.5 x 1.7 x 1.7 cm (series 26, image 21). Largest distinct infratentorial lesion is seen within the central cerebellum and measures 2.1 x 2.2 x 2.5 cm (series 26, image 12). Several of these lesions are somewhat cystic in appearance. Few lesions demonstrate intrinsic T1 hyperintensity and/or SWI signal, consistent with hemorrhage and/or blood products, most notable at the central cerebellum (series 11, image 11). Mild localized edema about several of these lesions without significant regional mass effect. An additional large enhancing extra-axial mass positioned at the posterior aspect of the superior sagittal sinus measures 3.2 x 6.6 x 4.8 cm (series 28, image 13). Scattered areas of internal susceptibility artifact consistent with blood products. Mild localized vasogenic edema without significant mass effect. This lesion is somewhat indeterminate, and could reflect an additional dural-based metastasis. Concomitant meningioma could also be considered. This lesion invades and obliterates the adjacent superior sagittal sinus, better evaluated on concomitant MRV. Mild  diffuse smooth pachymeningeal thickening and enhancement is likely secondary. No definite evidence for acute or subacute ischemia. Note made of a punctate focus of diffusion abnormality at the right basal ganglia, favored to reflect a small metastasis rather than infarct (series 5, image 75). No midline shift or hydrocephalus. No other extra-axial fluid collection. Pituitary gland suprasellar region within normal limits. Vascular: Major intracranial arterial vascular flow voids are maintained. Skull and upper cervical spine: Craniocervical junction within normal limits. Heterogeneous signal intensity seen within the visualized bone marrow without focal marrow replacing lesion. No scalp soft tissue abnormality. Sinuses/Orbits: Globes within normal limits. Increased CSF signal intensity seen along the optic nerve sheaths bilaterally, suggesting elevated intracranial pressures. Orbital  soft tissues otherwise unremarkable. Moderate mucosal thickening within the right maxillary sinus. Paranasal sinuses are otherwise largely clear. Small right with trace left mastoid effusions. Other: None. MRV HEAD FINDINGS: The above described dural based mass again noted at the posterior aspect of the superior sagittal sinus. The mass invades and obliterates the superior sagittal sinus at this level (series 1, image 98). Tubular filling defect is seen within the superior sagittal sinus anterior to the mass, likely a small amount of nonocclusive thrombus (series 13, images 88, 102). Superior sagittal sinus otherwise patent anteriorly. Additional extension of the lesion to involve the posterior aspect of the suture superior sagittal sinus as it courses towards the torcula (series 12, image 82) the torcula itself is patent. Transverse and sigmoid sinuses are patent as are the visualized proximal internal jugular veins. Straight sinus, vein of Galen, inferior sagittal sinus, internal cerebral veins, and basal veins of Rosenthal are patent.  No abnormality about the cavernous sinus. Superior orbital veins symmetric and within normal limits. No appreciable cortical venous thrombosis. IMPRESSION: MRI HEAD IMPRESSION: 1. 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 Mild localized edema with hemorrhagic blood products about several of these lesions without significant regional mass effect or midline shift. 2. 3.2 x 6.6 x 4.8 cm enhancing extra-axial mass at the posterior aspect of the superior sagittal sinus, indeterminate. While this finding could reflect an additional dural-based metastasis, a possible concomitant meningioma could also be considered. MRV HEAD IMPRESSION: 1. Invasion and obliteration of the posterior superior sagittal sinus by the above described dural based mass. Small amount of additional nonocclusive thrombus within the superior sagittal sinus anterior to the mass as above. 2. Remainder of the major dural sinuses are otherwise patent. Electronically Signed   By: Jeannine Boga M.D.   On: 11/28/2021 00:09   CT CHEST ABDOMEN PELVIS W CONTRAST  Result Date: 11/26/2021 CLINICAL DATA:  Persistent cough, shortness of breath EXAM: CT CHEST, ABDOMEN, AND PELVIS WITH CONTRAST TECHNIQUE: Multidetector CT imaging of the chest, abdomen and pelvis was performed following the standard protocol during bolus administration of intravenous contrast. CONTRAST:  1107mL OMNIPAQUE IOHEXOL 300 MG/ML  SOLN COMPARISON:  Previous studies including chest radiographs done on 11/24/2021 FINDINGS: CT CHEST FINDINGS Cardiovascular: Coronary artery calcifications are seen. There is homogeneous enhancement in thoracic aorta. There are no intraluminal filling defects in central pulmonary artery branches. There is extrinsic compression of right main pulmonary artery caused by pathologically enlarged lymph nodes in mediastinum and right hilum. There is extrinsic pressure over the superior vena cava caused  by lymphadenopathy. Mediastinum/Nodes: There is bulky lymphadenopathy in the mediastinum extending to the right hilum measuring approximately 8.6 x 8.6 x 9.5 cm. There is 2.6 x 1.9 cm node in the left hilum. Lungs/Pleura: Centrilobular emphysema is seen. In image 54 of series 4, there is 2.9 x 2.4 cm pleural-based noncalcified nodule in the right upper lobe. There are increased interstitial markings in the right upper lobe. In the image 71, there is 12 mm nodular density in the right upper lobe. In the image 73, there is 6 mm nodule in the right upper lobe. There are 3 small nodular densities each measuring less than 9 mm in size in the right lower lobe. Musculoskeletal: Unremarkable. CT ABDOMEN PELVIS FINDINGS Hepatobiliary: Liver measures 17.5 cm in length. In the image 58 of series 2, there is 1.6 cm fluid density lesion in the left lobe, possibly cyst. There are other scattered low-density foci in the right  lobe some of which may be cysts. Possibility of space-occupying neoplastic process is not excluded. There are slightly enlarged lymph nodes anterior to the liver and adjacent to the pericardium largest measuring 2.7 x 1.7 cm. There is no dilation of bile ducts. Gallbladder is not distended. Pancreas: Head of the pancreas is prominent in size. There is no dilation of pancreatic duct. No focal abnormality is seen in the body and tail of pancreas. Spleen: Spleen is not enlarged. Adrenals/Urinary Tract: There is 3 cm nodule in the left adrenal. There is 2 cm nodule in the right adrenal. There is no hydronephrosis. There are no renal or ureteral stones. There are smooth marginated nodular densities adjacent to the kidneys largest measuring 3.6 cm posterior to the upper pole of right kidney. Density measurements in this lesions are higher than usual for simple cysts. Ureters not dilated. Urinary bladder is not distended. Stomach/Bowel: Stomach is unremarkable. Small bowel loops are not dilated. Appendix is not  dilated. There is no significant wall thickening in colon. Vascular/Lymphatic: There are scattered atherosclerotic plaques and calcifications in the aorta and its major branches. There are pathologically enlarged lymph nodes in the mesentery and retroperitoneum. Largest of these nodes is noted anterior to the transverse colon measuring 5.9 x 3 cm. There are multiple other smaller nodules of varying sizes in the abdomen in the mesentery and retroperitoneum. Reproductive: Prostate is not enlarged. There is fluid density in the the region of prostatic urethra. This may suggest previous prostate surgery. Other: In image 76 of series 2, there is a 12 mm subcutaneous nodule in the right anterior abdominal wall. Small bilateral inguinal hernias containing fat are seen. Musculoskeletal: Unremarkable. IMPRESSION: There is 2.9 cm lobulated noncalcified pleural-based nodule in the right upper lobe suggesting possible primary malignant neoplasm. There are pathologically enlarged bulky lymph nodes in the mediastinum and both hilar regions, more so on the right side suggesting metastatic lymphadenopathy. There is extrinsic pressure over the right main pulmonary artery and superior vena cava by the enlarged lymph nodes in mediastinum. Increased interstitial markings and small scattered nodules are seen in the right lung which may be part of pneumonia or pulmonary metastatic disease. There are nodular densities in both adrenals largest measuring 3 cm in the left adrenal suggesting possible metastatic disease. There are enlarged lymph nodes in the retroperitoneum and mesentery largest measuring 5.9 cm in maximum diameter suggesting metastatic lymphadenopathy. There are multiple cysts in the liver. There are other indeterminate low-density lesions of varying sizes in the liver. Possibility of hepatic metastatic disease is not excluded. Head of the pancreas is enlarged in size with lobulated margins. Possibility of neoplastic process  in the head of the pancreas is not excluded. Follow-up PET-CT and biopsy as warranted should be considered. Coronary artery calcifications are seen. Other findings as described in the body of the report. Electronically Signed   By: Elmer Picker M.D.   On: 11/26/2021 13:49   CT BIOPSY  Result Date: 11/28/2021 INDICATION: 65 year old with evidence of diffuse metastatic disease. Tissue diagnosis is needed. EXAM: CT-GUIDED BIOPSY OF ANTERIOR ABDOMINAL PERITONEAL MASS MEDICATIONS: Moderate sedation ANESTHESIA/SEDATION: Moderate (conscious) sedation was employed during this procedure. A total of Versed 2.0mg  and fentanyl 100 mcg was administered intravenously at the order of the provider performing the procedure. Total intra-service moderate sedation time: 14 minutes. Patient's level of consciousness and vital signs were monitored continuously by radiology nurse throughout the procedure under the supervision of the provider performing the procedure. FLUOROSCOPY TIME:  None COMPLICATIONS:  None immediate. PROCEDURE: Informed written consent was obtained from the patient after a thorough discussion of the procedural risks, benefits and alternatives. All questions were addressed. A timeout was performed prior to the initiation of the procedure. Patient was placed supine on CT scanner. Images of the upper abdomen are obtained. The large mass in the anterior upper abdomen was identified and targeted. The skin was prepped with chlorhexidine and sterile field was created. Skin and soft tissues were anesthetized with 1% lidocaine. A small incision was made. Using CT guidance, 17 gauge coaxial needle was directed into the anterior abdominal mass. Multiple core biopsies were obtained with an 18 gauge core device and specimens were placed in formalin. 17 gauge needle was removed without complication. Bandage placed over the puncture site. FINDINGS: Again noted is large mass in the anterior upper abdomen adjacent to the  abdominal wall. This structure measures up to 5.1 cm. Needle position was confirmed within the lesion. Specimens placed in formalin. IMPRESSION: CT-guided core biopsies of the anterior upper abdominal mass. Electronically Signed   By: Markus Daft M.D.   On: 11/28/2021 12:50   DG CHEST PORT 1 VIEW  Result Date: 11/30/2021 CLINICAL DATA:  Cough, prostate cancer, COVID EXAM: PORTABLE CHEST 1 VIEW COMPARISON:  None. FINDINGS: Right Port-A-Cath tip at the cavoatrial junction. Heart is normal size. Left lung clear. Right hilar and mediastinal adenopathy again noted. Peripheral right upper lobe nodule/mass again noted. No effusions. No acute bony abnormality. IMPRESSION: Right upper lobe mass with right hilar and mediastinal adenopathy. Electronically Signed   By: Rolm Baptise M.D.   On: 11/30/2021 17:32   IR IMAGING GUIDED PORT INSERTION  Result Date: 11/28/2021 INDICATION: 65 year old with evidence of metastatic disease. Port-A-Cath needed for therapy. EXAM: FLUOROSCOPIC AND ULTRASOUND GUIDED PLACEMENT OF A SUBCUTANEOUS PORT COMPARISON:  None. MEDICATIONS: Moderate sedation ANESTHESIA/SEDATION: Versed 2.0 mg IV; Fentanyl 100 mcg IV; Moderate Sedation Time:  44 minutes The patient was continuously monitored during the procedure by the interventional radiology nurse under my direct supervision. FLUOROSCOPY TIME:  24 seconds, 1 mGy COMPLICATIONS: None immediate. PROCEDURE: The procedure, risks, benefits, and alternatives were explained to the patient. Questions regarding the procedure were encouraged and answered. The patient understands and consents to the procedure. Patient was placed supine on the interventional table. Ultrasound confirmed a patent right internal jugular vein. Ultrasound image was saved for documentation. The right chest and neck were cleaned with a skin antiseptic and a sterile drape was placed. Maximal barrier sterile technique was utilized including caps, mask, sterile gowns, sterile gloves,  sterile drape, hand hygiene and skin antiseptic. The right neck was anesthetized with 1% lidocaine. Small incision was made in the right neck with a blade. Micropuncture set was placed in the right internal jugular vein with ultrasound guidance. The micropuncture wire was used for measurement purposes. The right chest was anesthetized with 1% lidocaine with epinephrine. #15 blade was used to make an incision and a subcutaneous port pocket was formed. [place] was assembled. Subcutaneous tunnel was formed with a stiff tunneling device. The port catheter was brought through the subcutaneous tunnel. The port was placed in the subcutaneous pocket. The micropuncture set was exchanged for a peel-away sheath. The catheter was placed through the peel-away sheath and the tip was positioned at the superior cavoatrial junction. Catheter placement was confirmed with fluoroscopy. The port was accessed and flushed with heparinized saline. The port pocket was closed using two layers of absorbable sutures and Dermabond. The vein skin  site was closed using a single layer of absorbable suture and Dermabond. Sterile dressings were applied. Patient tolerated the procedure well without an immediate complication. Ultrasound and fluoroscopic images were taken and saved for this procedure. IMPRESSION: Placement of a subcutaneous power-injectable port device. Catheter tip at the superior cavoatrial junction. Electronically Signed   By: Markus Daft M.D.   On: 11/28/2021 17:48

## 2021-12-03 NOTE — Telephone Encounter (Signed)
Scheduled per sch msg. Called and spoke with [name]. Confirmed appt

## 2021-12-03 NOTE — Progress Notes (Signed)
Arrived to room to administer chemotherapy treatment. Patient sitting in chair. Wife in room. Patient alert and oriented with no complaints. They both verbalized they are aware of chemotherapy treatment and are ready to proceed. Patient met all pre-treatment parameters. Written and verbal consent obtained and treatment initiated without difficulty. Education assessment completed and filed. Patient tolerated treatment well with no adverse events. Report given to Earnie Larsson, RN. She will place patient on chemotherapy precautions.

## 2021-12-04 ENCOUNTER — Ambulatory Visit
Admit: 2021-12-04 | Discharge: 2021-12-04 | Disposition: A | Discharge status: Home / Self Care | Payer: BC Managed Care – PPO | Attending: Radiation Oncology | Admitting: Radiation Oncology

## 2021-12-04 ENCOUNTER — Encounter: Payer: Self-pay | Admitting: Hematology and Oncology

## 2021-12-04 ENCOUNTER — Encounter: Payer: Self-pay | Admitting: *Deleted

## 2021-12-04 DIAGNOSIS — K5909 Other constipation: Secondary | ICD-10-CM

## 2021-12-04 LAB — BASIC METABOLIC PANEL
Anion gap: 9 (ref 5–15)
BUN: 23 mg/dL (ref 8–23)
CO2: 23 mmol/L (ref 22–32)
Calcium: 9.1 mg/dL (ref 8.9–10.3)
Chloride: 98 mmol/L (ref 98–111)
Creatinine, Ser: 0.48 mg/dL — ABNORMAL LOW (ref 0.61–1.24)
GFR, Estimated: >60 mL/min (ref >60)
Glucose, Bld: 170 mg/dL — ABNORMAL HIGH (ref 70–99)
Potassium: 4 mmol/L (ref 3.5–5.1)
Sodium: 130 mmol/L — ABNORMAL LOW (ref 135–145)

## 2021-12-04 LAB — GLUCOSE, CAPILLARY
Glucose-Capillary: 242 mg/dL — ABNORMAL HIGH (ref 70–99)
Glucose-Capillary: 265 mg/dL — ABNORMAL HIGH (ref 70–99)
Glucose-Capillary: 174 mg/dL — ABNORMAL HIGH (ref 70–99)
Glucose-Capillary: 201 mg/dL — ABNORMAL HIGH (ref 70–99)

## 2021-12-04 LAB — LACTATE DEHYDROGENASE: LDH: 271 U/L — ABNORMAL HIGH (ref 98–192)

## 2021-12-04 LAB — URIC ACID: Uric Acid, Serum: 2.4 mg/dL — ABNORMAL LOW (ref 3.7–8.6)

## 2021-12-04 MED ORDER — SODIUM CHLORIDE 1 G PO TABS
2.0000 g | ORAL_TABLET | Freq: Three times a day (TID) | ORAL | Status: DC
  Administered 2021-12-04 – 2021-12-05 (×3): 2 g via ORAL
  Filled 2021-12-04 (×4): qty 2

## 2021-12-04 MED ORDER — SENNOSIDES-DOCUSATE SODIUM 8.6-50 MG PO TABS
1.0000 | ORAL_TABLET | Freq: Every day | ORAL | Status: DC
Start: 2021-12-04 — End: 2021-12-04

## 2021-12-04 MED ORDER — SODIUM CHLORIDE 0.9 % IV SOLN
100.0000 mg/m2 | Freq: Once | INTRAVENOUS | Status: AC
  Administered 2021-12-04: 240 mg via INTRAVENOUS
  Filled 2021-12-04: qty 12

## 2021-12-04 MED ORDER — DEXAMETHASONE 4 MG PO TABS
4.0000 mg | ORAL_TABLET | Freq: Two times a day (BID) | ORAL | Status: DC
  Administered 2021-12-04 – 2021-12-05 (×2): 4 mg via ORAL
  Filled 2021-12-04 (×2): qty 1

## 2021-12-04 MED ORDER — FUROSEMIDE 20 MG PO TABS
10.0000 mg | ORAL_TABLET | Freq: Every day | ORAL | Status: DC
  Administered 2021-12-05: 10 mg via ORAL
  Filled 2021-12-04: qty 1

## 2021-12-04 MED ORDER — SODIUM CHLORIDE 0.9 % IV SOLN
10.0000 mg | Freq: Once | INTRAVENOUS | Status: AC
  Administered 2021-12-04: 10 mg via INTRAVENOUS
  Filled 2021-12-04: qty 1

## 2021-12-04 MED ORDER — SENNOSIDES-DOCUSATE SODIUM 8.6-50 MG PO TABS
2.0000 | ORAL_TABLET | Freq: Two times a day (BID) | ORAL | Status: DC
  Administered 2021-12-04 – 2021-12-05 (×3): 2 via ORAL
  Filled 2021-12-04 (×3): qty 2

## 2021-12-04 MED ORDER — LACTULOSE 10 GM/15ML PO SOLN
10.0000 g | Freq: Two times a day (BID) | ORAL | Status: DC
  Administered 2021-12-04 – 2021-12-05 (×3): 10 g via ORAL
  Filled 2021-12-04 (×3): qty 15

## 2021-12-04 NOTE — Progress Notes (Signed)
Occupational Therapy Treatment Patient Details Name: Ryan [name] MRN: [id number] DOB: 01-10-1956 Today's Date: 12/04/2021   History of present illness Patient is a 65 year old male who presented to the hosptial with severe dizziness and disequilibrium. patient was found to have acute hyponatremia, metastatic lung cancer with mets to brain and adrenal glands. PMH: prostate cancer, DM II   OT comments  Patient with nice progress toward patient focused goals.  States he still feels, weak, no appetite, but moving better.  He is able to complete stand grooming tasks with generalized supervision in standing, and lower body dressing with Bode for sit/stand level.  OT to provide upper body there-x.  Continue to follow in the acute setting to maximize functional status, HH OT can be considered if the patient agrees.       Recommendations for follow up therapy are one component of a multi-disciplinary discharge planning process, led by the attending physician.  Recommendations may be updated based on patient status, additional functional criteria and insurance authorization.    Follow Up Recommendations  Home health OT    Assistance Recommended at Discharge Intermittent Supervision/Assistance  Equipment Recommendations       Recommendations for Other Services      Precautions / Restrictions Precautions Precautions: Fall Restrictions Weight Bearing Restrictions: No       Mobility Bed Mobility               General bed mobility comments: up in recliner    Transfers Overall transfer level: Needs assistance Equipment used: Rolling walker (2 wheels) Transfers: Sit to/from Stand Sit to Stand: Supervision                 Balance Overall balance assessment: Needs assistance Sitting-balance support: No upper extremity supported;Feet supported Sitting balance-Leahy Scale: Good     Standing balance support: Reliant on assistive device for balance Standing balance-Leahy  Scale: Fair                             ADL either performed or assessed with clinical judgement   ADL       Grooming: Wash/dry hands;Wash/dry face;Oral care;Supervision/safety;Standing               Lower Body Dressing: Min guard;Sit to/from stand   Toilet Transfer: Supervision/safety;Rolling walker (2 wheels);Ambulation                  Extremity/Trunk Assessment Upper Extremity Assessment Upper Extremity Assessment: Generalized weakness;Overall [facility] for tasks assessed       Cervical / Trunk Assessment Cervical / Trunk Assessment: Normal    Vision       Perception     Praxis      Cognition Arousal/Alertness: Awake/alert Behavior During Therapy: WFL for tasks assessed/performed Overall Cognitive Status: Within Functional Limits for tasks assessed                                                              Pertinent Vitals/ Pain       Pain Assessment: No/denies pain Pain Intervention(s): Monitored during session  Frequency  Min 2X/week        Progress Toward Goals  OT Goals(current goals can now be found in the care plan section)  Progress towards OT goals: Progressing toward goals  Acute Rehab OT Goals Patient Stated Goal: get back home Time For Goal Achievement: 12/16/21 Potential to Achieve Goals: Good  Plan Discharge plan remains appropriate    Co-evaluation                 AM-PAC OT "6 Clicks" Daily Activity     Outcome Measure   Help from another person eating meals?: None Help from another person taking care of personal grooming?: None Help from another person toileting, which includes using toliet, bedpan, or urinal?: A Little Help from another person bathing (including washing, rinsing, drying)?: A Little Help from another person to put on and taking off regular upper body clothing?: None Help from  another person to put on and taking off regular lower body clothing?: A Little 6 Click Score: 21    End of Session Equipment Utilized During Treatment: Gait belt;Rolling walker (2 wheels)  OT Visit Diagnosis: Unsteadiness on feet (R26.81);History of falling (Z91.81)   Activity Tolerance Patient tolerated treatment well;Patient limited by lethargy   Patient Left in chair;with call bell/phone within reach;with family/visitor present   Nurse Communication          Time: [phone] OT Time Calculation (min): 16 min  Charges: OT General Charges $OT Visit: 1 Visit OT Treatments $Self Care/Home Management : 8-22 mins  12/04/2021  RP, OTR/L  Acute Rehabilitation Services  Office:  [phone]   Metta Clines 12/04/2021, 8:27 AM

## 2021-12-04 NOTE — Progress Notes (Signed)
Physical Therapy Treatment Patient Details Name: Ryan [name] MRN: [id number] DOB: 08/13/1956 Today's Date: 12/04/2021   History of Present Illness Patient is a 65 year old male who presented to the hosptial with severe dizziness and disequilibrium. patient was found to have acute hyponatremia, metastatic lung cancer with mets to brain and adrenal glands. PMH: prostate cancer, DM II    PT Comments    Patient c/o fatigue and declined to ambulate out of room today. He was agreeable to therapeutic exercise for LE's and completed functional sit<>stands and standing hip strengthening. Pt required tactile cues to facilitate proper posture during hip marching and abduction as he had tendency to lean laterally. Pt will continue to benefit from skilled PT to progress mobility and address impairments. Acute will progress as able, HHPT recommended for follow up.    Recommendations for follow up therapy are one component of a multi-disciplinary discharge planning process, led by the attending physician.  Recommendations may be updated based on patient status, additional functional criteria and insurance authorization.  Follow Up Recommendations  Home health PT     Assistance Recommended at Discharge Frequent or constant Supervision/Assistance  Equipment Recommendations  None recommended by PT    Recommendations for Other Services       Precautions / Restrictions Precautions Precautions: Fall     Mobility  Bed Mobility               General bed mobility comments: pt OOB in recliner    Transfers Overall transfer level: Needs assistance Equipment used: Rolling walker (2 wheels) Transfers: Sit to/from Stand Sit to Stand: Supervision           General transfer comment: guard/sueprvision for safety. pt complete multiple reps for LE strengthening.    Ambulation/Gait Ambulation/Gait assistance: Supervision Gait Distance (Feet): 10 Feet Assistive device: Rolling walker (2  wheels) Gait Pattern/deviations: Step-through pattern;Decreased stride length Gait velocity: decr     General Gait Details: pt amb small amount in room, declined ambulation in hall, prefered to do exercises today.   Stairs             Wheelchair Mobility    Modified Rankin (Stroke Patients Only)       Balance Overall balance assessment: Needs assistance Sitting-balance support: No upper extremity supported;Feet supported Sitting balance-Leahy Scale: Good     Standing balance support: Reliant on assistive device for balance Standing balance-Leahy Scale: Fair                              Cognition Arousal/Alertness: Awake/alert Behavior During Therapy: WFL for tasks assessed/performed Overall Cognitive Status: Within Functional Limits for tasks assessed                                          Exercises Other Exercises Other Exercises: Bil LE standing: hip abduction 10 reps, marchign 10 reps Other Exercises: 2x5 reps sit<>stand no UE use for power up Other Exercises: bil LE calf rasises in standing 20 reps, 2 sec holds    General Comments        Pertinent Vitals/Pain      Home Living                          Prior Function  PT Goals (current goals can now be found in the care plan section) Acute Rehab PT Goals PT Goal Formulation: With patient/family Time For Goal Achievement: 12/16/21 Potential to Achieve Goals: Good Progress towards PT goals: Progressing toward goals    Frequency    Min 3X/week      PT Plan Current plan remains appropriate    Co-evaluation              AM-PAC PT "6 Clicks" Mobility   Outcome Measure  Help needed turning from your back to your side while in a flat bed without using bedrails?: A Little Help needed moving from lying on your back to sitting on the side of a flat bed without using bedrails?: A Little Help needed moving to and from a bed to a chair  (including a wheelchair)?: A Little Help needed standing up from a chair using your arms (e.g., wheelchair or bedside chair)?: A Little Help needed to walk in hospital room?: A Little Help needed climbing 3-5 steps with a railing? : A Lot 6 Click Score: 17    End of Session   Activity Tolerance: Patient tolerated treatment well Patient left: in chair;with call bell/phone within reach Nurse Communication: Mobility status PT Visit Diagnosis: Unsteadiness on feet (R26.81);Difficulty in walking, not elsewhere classified (R26.2)     Time: 1123-1140 PT Time Calculation (min) (ACUTE ONLY): 17 min  Charges:  $Therapeutic Exercise: 8-22 mins                     Ryan [name], DPT Acute Rehabilitation Services Office [phone] Pager [phone]    Ryan [name] 12/04/2021, 2:29 PM

## 2021-12-04 NOTE — Progress Notes (Signed)
Attempted to call report at 8 and again at 72 both attempts were denied.

## 2021-12-04 NOTE — Progress Notes (Signed)
PROGRESS NOTE    Ryan [name]  ZOX:[id number] DOB: 1956-08-02 DOA: 11/26/2021  PCP: [name], DO   Brief Narrative:  65 years old male with PMH significant for prostate cancer, type 2 diabetes presented in the ED with 3 weeks history of progressive weakness, decreased appetite, 20 pound weight loss.  Patient also been experiencing episodes of severe dizziness and disequilibrium. He reports he was diagnosed with COVID 3 weeks ago.  Work-up in the ED shows sodium 118.  CT chest showed 2.9 cm lobulated noncalcified pleural-based nodule in the right upper lobe suggesting possible primary malignant neoplasm.  There is multiple lesions noted in the brain, adrenal gland and nearby lymph nodes suggesting metastasis. Also found to have acute hyponatremia that could be secondary to SIADH in the setting of new onset metastatic lung cancer.  Patient underwent CT-guided biopsy of abdominal peritoneal mass.  Patient also underwent Port-A-Cath placement for chemotherapy. Patient started radiation treatment and chemotherapy.  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 -slowly improving with fluid restriction. -Nephrology consulted  Metastatic lung CA with mets in the brain, adrenal gland: Lung nodule and multiple lesions in brain, adrenal gland and nearby lymph node concerning for metastasis with primary lung carcinoma until proven otherwise. IR consulted for biopsy of peritoneal lesion. MRI brain confirms the metastatic lesions in the brain.Invasion and obliteration of the posterior superior sagittal sinus by the above described dural based mass. Patient underwent successful CT-guided biopsy of the abdominal mass, He also underwent Port-A-Cath placement by IR on 12/9. Patient started  radiation treatment on 12/12.   -chemo started 12/14 -Patient will need outpatient PET/CT imaging.  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 -should get better with radiation.  History of prostate cancer: S/p prostatectomy with normal PSA.  Diabetes mellitus: -SSI -suspect will be uncontrolled while on steroids  Essential hypertension: Hold Lisinopril. Continue amlodipine 10 mg daily.  Constipation: Continue senna and Colace. MiraLAX daily.  Recent COVID -past need for isolation -November  Goals of care discussion.: Patient does have stage IV lung cancer with metastasis.  Chemotherapy would be palliative in nature. -defer to oncology  DVT prophylaxis:  SCDs Code Status: Full code. Family Communication: Wife in the room. Disposition Plan:  Status is: Inpatient  Remains inpatient appropriate because:    Anticipated discharge home late Friday or early Saturday  Consultants:  Oncology, radiation oncology  Procedures: CT guided biopsy of the peritoneal mass.  Port a cath placement  Antimicrobials:   Anti-infectives (From admission, onward)    None       Subjective: Says he has not had a BM in days   Objective: Vitals:   12/03/21 2000 12/03/21 2315 12/04/21 0143 12/04/21 0455  BP:  (!) 154/76 (!) 151/78 (!) 151/80  Pulse:  86 81 95  Resp:  16 17 15   Temp: 97.8 F (36.6 C) (!) 97.4 F (36.3 C) 97.8 F (36.6 C) (!) 97.2 F (36.2 C)  TempSrc: Oral Oral Oral Oral  SpO2:  95% 96% 100%  Weight:      Height:        Intake/Output Summary (Last 24 hours) at 12/04/2021 0758 Last data filed at 12/03/2021 2242 Gross per 24 hour  Intake 1980.89 ml  Output --  Net 1980.89 ml   Filed Weights   11/26/21 0924 12/03/21 0909  Weight: 106.6 kg 102.2 kg    Examination:    General: Appearance:     Overweight male in no acute distress     Lungs:     respirations unlabored  Heart:    Normal heart rate.    MS:   All extremities  are intact.    Neurologic:   Awake, alert, oriented x 3. No apparent focal neurological           defect.          Data Reviewed: I have personally reviewed following labs and imaging studies  CBC: Recent Labs  Lab 11/29/21 0325 12/02/21 0556 12/03/21 0036  WBC 6.7 15.5* 12.6*  HGB 12.2* 12.9* 13.0  HCT 37.0* 38.0* 38.5*  MCV 95.1 91.6 92.5  PLT 231 186 161   Basic Metabolic Panel: Recent Labs  Lab 11/29/21 0325 11/30/21 0330 12/01/21 0314 12/01/21 0945 12/02/21 0556 12/02/21 1815 12/03/21 0036 12/03/21 0805 12/04/21 0321  NA 124*   < > 121* 121* 121* 126* 129* 130* 130*  K 4.2   < > 4.2 4.0 3.9  --  4.1  --  4.0  CL 91*   < > 90* 90* 89*  --  96*  --  98  CO2 21*   < > 19* 22 21*  --  22  --  23  GLUCOSE 140*   < > 155* 196* 167*  --  163*  --  170*  BUN 19   < > 16 16 16   --  15  --  23  CREATININE 0.53*   < > 0.45* 0.53* 0.47*  --  0.53*  --  0.48*  CALCIUM 9.0   < > 9.2 8.8* 8.9  --  9.3  --  9.1  MG 2.0  --  1.9  --   --   --   --   --   --   PHOS 3.2  --  3.4  --   --   --   --   --   --    < > = values in this interval not displayed.   GFR: Estimated Creatinine Clearance: 114.3 mL/min (A) (by C-G formula based on SCr of 0.48 mg/dL (L)). Liver Function Tests: Recent Labs  Lab 12/02/21 0556 12/03/21 0036  AST 28 29  ALT 31 39  ALKPHOS 65 68  BILITOT 0.9 0.8  PROT 7.2 6.9  ALBUMIN 3.6 3.5   No results for input(s): LIPASE, AMYLASE in the last 168 hours.  No results for input(s): AMMONIA in the last 168 hours. Coagulation Profile: Recent Labs  Lab 11/28/21 0023  INR 0.9   Cardiac Enzymes: No results for input(s): CKTOTAL, CKMB, CKMBINDEX, TROPONINI in the last 168 hours. BNP (last 3 results) No results for input(s): PROBNP in the last 8760 hours. HbA1C: No results for input(s): HGBA1C in the last 72 hours.  CBG: Recent Labs  Lab 12/03/21 0743 12/03/21 1146 12/03/21 1615 12/03/21 2121 12/04/21 0753  GLUCAP 180* 244* 219* 279* 242*    Lipid Profile: No results for input(s): CHOL, HDL, LDLCALC, TRIG, CHOLHDL, LDLDIRECT in the last 72 hours. Thyroid Function Tests: Recent Labs    12/02/21 0547  TSH 2.112   Anemia Panel: No results for input(s): VITAMINB12, FOLATE, FERRITIN, TIBC, IRON, RETICCTPCT in the last 72 hours. Sepsis Labs: No results for input(s): PROCALCITON, LATICACIDVEN in the last 168 hours.  Recent  Results (from the past 240 hour(s))  Resp Panel by RT-PCR (Flu A&B, Covid) Nasopharyngeal Swab     Status: Abnormal   Collection Time: 11/26/21  4:04 PM   Specimen: Nasopharyngeal Swab; Nasopharyngeal(NP) swabs in vial transport medium  Result Value Ref Range Status   SARS Coronavirus 2 by RT PCR POSITIVE (A) NEGATIVE Final    Comment: RESULT CALLED TO, READ BACK BY AND VERIFIED WITH: JAMIE BAILEY RN @1646  11/26/2021 OLSONM (NOTE) SARS-CoV-2 target nucleic acids are DETECTED.  The SARS-CoV-2 RNA is generally detectable in upper respiratory specimens during the acute phase of infection. Positive results are indicative of the presence of the identified virus, but do not rule out bacterial infection or co-infection with other pathogens not detected by the test. Clinical correlation with patient history and other diagnostic information is necessary to determine patient infection status. The expected result is Negative.  Fact Sheet for Patients: EntrepreneurPulse.com.au  Fact Sheet for Healthcare Providers: IncredibleEmployment.be  This test is not yet approved or cleared by the [place] FDA and  has been authorized for detection and/or diagnosis of SARS-CoV-2 by FDA under an Emergency Use Authorization (EUA).  This EUA will remain in effect (meaning this test  can be used) for the duration of  the COVID-19 declaration under Section 564(b)(1) of the Act, 21 U.S.C. section 360bbb-3(b)(1), unless the authorization is terminated or revoked sooner.      Influenza A by PCR NEGATIVE NEGATIVE Final   Influenza B by PCR NEGATIVE NEGATIVE Final    Comment: (NOTE) The Xpert Xpress SARS-CoV-2/FLU/RSV plus assay is intended as an aid in the diagnosis of influenza from Nasopharyngeal swab specimens and should not be used as a sole basis for treatment. Nasal washings and aspirates are unacceptable for Xpert Xpress SARS-CoV-2/FLU/RSV testing.  Fact Sheet for Patients: EntrepreneurPulse.com.au  Fact Sheet for Healthcare Providers: IncredibleEmployment.be  This test is not yet approved or cleared by the [place] FDA and has been authorized for detection and/or diagnosis of SARS-CoV-2 by FDA under an Emergency Use Authorization (EUA). This EUA will remain in effect (meaning this test can be used) for the duration of the COVID-19 declaration under Section 564(b)(1) of the Act, 21 U.S.C. section 360bbb-3(b)(1), unless the authorization is terminated or revoked.  Performed at [facility], [address] [place], [place] [address]   MRSA Next Gen by PCR, Nasal     Status: None   Collection Time: 11/26/21 11:44 PM   Specimen: Nasal Mucosa; Nasal Swab  Result Value Ref Range Status   MRSA by PCR Next Gen NOT DETECTED NOT DETECTED Final    Comment: (NOTE) The GeneXpert MRSA Assay (FDA approved for NASAL specimens only), is one component of a comprehensive MRSA colonization surveillance program. It is not intended to diagnose MRSA infection nor to guide or monitor treatment for MRSA infections. Test performance is not FDA approved in patients less than 38 years old. Performed at [facility], [place] [address], [place], [place] [address]     Radiology Studies: No results found.   Scheduled Meds:  (feeding supplement) PROSource Plus  30 mL Oral Daily   allopurinol  300 mg Oral Daily   amLODipine  10 mg Oral Daily   Chlorhexidine Gluconate Cloth  6 each Topical Daily    dexamethasone (DECADRON) injection  4 mg Intravenous Q8H   docusate sodium  100 mg Oral BID   enoxaparin (LOVENOX) injection  40 mg Subcutaneous Daily   feeding supplement  1  Container Oral Q24H   feeding supplement  237 mL Oral BID BM   furosemide  10 mg Oral BID   insulin aspart  0-15 Units Subcutaneous TID WC   mouth rinse  15 mL Mouth Rinse BID   memantine  5 mg Oral q AM   multivitamin with minerals  1 tablet Oral Daily   nicotine  7 mg Transdermal Daily   pantoprazole  40 mg Oral Daily   polyethylene glycol  17 g Oral Daily   sodium chloride flush  10-40 mL Intracatheter Q12H   sodium chloride flush  10-40 mL Intracatheter Q12H   sodium chloride  1 g Oral Q lunch   sodium chloride  2 g Oral BID WC   Continuous Infusions:   LOS: 8 days    Time spent: 25 mins    Geradine Girt, DO [facility]   If 7PM-7AM, please contact night-coverage

## 2021-12-04 NOTE — Progress Notes (Signed)
Oncology Nurse Navigator Documentation  Oncology Nurse Navigator Flowsheets 12/04/2021  Abnormal Finding Date 11/26/2021  Confirmed Diagnosis Date 11/28/2021  Diagnosis Status Confirmed Diagnosis Complete  Planned Course of Treatment Chemotherapy;Radiation  Phase of Treatment Radiation  Chemotherapy Actual Start Date: 12/03/2021  Radiation Actual Start Date: 11/28/2021  Navigator Follow Up Date: 12/22/2021  Navigator Follow Up Reason: Review Note  Navigator Location [facility]  Referral Date to RadOnc/MedOnc 11/26/2021  Navigator Encounter Type Inpatient/I received a message from CNS that patient and wife had questions regarding treatment and next steps.  I spoke to them both today.  He is doing well without complaints. I listened and explained next steps.  I gave them information on smoking cessation.  It was a pleasure to meet Mr and Ms Volkert today.    Treatment Initiated Date 11/28/2021  Patient Visit Type Initial;Inpatient  Treatment Phase Treatment  Barriers/Navigation Needs Education  Education Newly Diagnosed Cancer Education;Smoking cessation  Interventions Education;Psycho-Social Support  Acuity Level 4-High Needs (Greater Than 4 Barriers Identified)  Education Method Verbal  Time Spent with Patient 45

## 2021-12-04 NOTE — Progress Notes (Signed)
[facility] Progress Note  Subjective: no UOP recorded overnight. Na+ stable at 130 today.   Vitals:   12/03/21 2000 12/03/21 2315 12/04/21 0143 12/04/21 0455  BP:  (!) 154/76 (!) 151/78 (!) 151/80  Pulse:  86 81 95  Resp:  16 17 15   Temp: 97.8 F (36.6 C) (!) 97.4 F (36.3 C) 97.8 F (36.6 C) (!) 97.2 F (36.2 C)  TempSrc: Oral Oral Oral Oral  SpO2:  95% 96% 100%  Weight:      Height:        Exam: Gen alert, no distress, large man No jvd or bruits Chest clear bilat to bases RRR no MRG Abd soft ntnd no mass or ascites +bs Ext 1+ RUE edema, no LE edema Neuro is alert, Ox 3 , nonfocal      Home meds include - liptor, metformin, amvien, zestril, namenda, novolog 70-30 not clear dosing, prns/ supps/ vits      UNa 38, UOsm 343    UA 12/7 - wnl    BP's are high 160- 197/ 70- 90, hR 80-90,  RR 15-18, afeb     Na 121  CO2 21  BUN 16  cr 0.47   WBC 15K  Hb 12.9       Assessment/ Plan: Hyponatremia - pt is euvolemic, no signs or hx of renal / liver/ heart failure.  TSH and am cortisol pending. 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  No meds implicated. SP tolvaptan x 1 on 12/13. Na+ improved to 130. Started low dose po lasix, salt tabs and fluid restrict on 12/14. Na+ stable today at 130. Will ^NaCl tabs to 2 gm tid w/ meals (max dose is typically 3 gm tid). Recommend continue salt tabs, po lasix 10 mg qd and fluid restriction to 1000 cc per day for management of SIADH.  Have d/w oncology, does not need renal f/u. Please call as needed. Will sign off.  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 Pt started chemoRx on 12/14 w/ carboplatin/ etoposide. Per ONC.  Hx prostate cancer -sp prostatectomy DM2          Rob Dudley Cooley 12/04/2021, 12:37 PM   Recent Labs  Lab 11/29/21 0325 11/30/21 0330 12/01/21 0314 12/01/21 0945 12/02/21 0556 12/03/21 0036 12/04/21 0321  K 4.2   < > 4.2   < >  3.9 4.1 4.0  BUN 19   < > 16   < > 16 15 23   CREATININE 0.53*   < > 0.45*   < > 0.47* 0.53* 0.48*  CALCIUM 9.0   < > 9.2   < > 8.9 9.3 9.1  PHOS 3.2  --  3.4  --   --   --   --   HGB 12.2*  --   --   --  12.9* 13.0  --    < > = values in this interval not displayed.    Inpatient medications:  (feeding supplement) PROSource Plus  30 mL Oral Daily   allopurinol  300 mg Oral Daily   amLODipine  10 mg Oral Daily   Chlorhexidine Gluconate Cloth  6 each Topical Daily   dexamethasone  4 mg Oral BID   docusate sodium  100 mg Oral BID   enoxaparin (LOVENOX) injection  40 mg Subcutaneous Daily   etoposide  100 mg/m2 (Treatment Plan Recorded) Intravenous Once   feeding supplement  1 Container Oral  Q24H   feeding supplement  237 mL Oral BID BM   furosemide  10 mg Oral BID   insulin aspart  0-15 Units Subcutaneous TID WC   lactulose  10 g Oral BID   mouth rinse  15 mL Mouth Rinse BID   memantine  5 mg Oral q AM   multivitamin with minerals  1 tablet Oral Daily   nicotine  7 mg Transdermal Daily   pantoprazole  40 mg Oral Daily   polyethylene glycol  17 g Oral Daily   senna-docusate  2 tablet Oral BID   sodium chloride flush  10-40 mL Intracatheter Q12H   sodium chloride flush  10-40 mL Intracatheter Q12H   sodium chloride  1 g Oral Q lunch   sodium chloride  2 g Oral BID WC    acetaminophen **OR** acetaminophen, alum & mag hydroxide-simeth, calcium carbonate, diphenhydrAMINE, guaiFENesin-dextromethorphan, heparin lock flush, levalbuterol, LORazepam, methocarbamol, Muscle Rub, ondansetron **OR** ondansetron (ZOFRAN) IV, phenol, prochlorperazine, sodium chloride flush, sodium chloride flush, sodium chloride flush, zolpidem

## 2021-12-04 NOTE — Progress Notes (Addendum)
Clemons Salvucci   DOB:1956-03-13   UR#:[id number]    ASSESSMENT & PLAN:  I have seen him, examined him and edited the notes as follows  Extensive stage high-grade neuroendocrine carcinoma, small cell type Imaging findings discussed with the patient and his significant other IWe discussed the risks, benefits, side effects of carboplatin with etoposide Given the severity of his disease and life-threatening situation, I recommend inpatient chemotherapy He started cycle 1 of carboplatin/etoposide on 12/03/2021 and tolerated well He will proceed with day 2 of cycle 1 today as planned We can start to initiate dexamethasone taper  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   I explained to the patient the rationale of not discharging him until his sodium is consistently stable over 130 due to high risk of arrhythmia and seizures Unfortunately, this will not likely to improve until he gets started on systemic treatment   History of prostate cancer Status post prostatectomy with normal PSA in October 2022 He is followed as an outpatient by his primary care provider/urology   Diabetes mellitus On sliding scale insulin Management per hospitalist I am hopeful with dexamethasone taper, this will improve   Constipation Continue Colace 100 mg twice daily and add MiraLAX daily I will add lactulose as well   Goals of care discussion We discussed biopsy results today and the incurable nature of his disease We discussed treatment will palliative in nature   Discharge plan The patient needs to stay in the hospital due to safety and high risk of seizures with significant  hyponatremia He has started his first cycle of chemotherapy and has tolerated well; will complete cycle 1 on Friday, 12/05/2021 I will see him again tomorrow May be stable for discharge after chemotherapy and radiation on 12/05/2021 if sodium levels remain stable All questions were answered. The patient knows to call the clinic with any problems, questions or concerns.  [name], MD   Subjective:  More alert this morning  Sitting up in the recliner and conversant His fiance by the bedside Tolerated day 1 of cycle one of his chemotherapy well overall He is not having any nausea or vomiting Continues to have a cough  Objective:  Vitals:   12/04/21 0143 12/04/21 0455  BP: (!) 151/78 (!) 151/80  Pulse: 81 95  Resp: 17 15  Temp: 97.8 F (36.6 C) (!) 97.2 F (36.2 C)  SpO2: 96% 100%     Intake/Output Summary (Last 24 hours) at 12/04/2021 0848 Last data filed at 12/04/2021 0800 Gross per 24 hour  Intake 2217.89 ml  Output --  Net 2217.89 ml    GENERAL:alert, no distress and comfortable NEURO: alert & oriented x 3 with fluent speech, no focal motor/sensory deficits   Labs:  Recent Labs    11/26/21 1130 11/26/21 1329 12/02/21 0556 12/02/21 1815 12/03/21 0036 12/03/21 0805 12/04/21 0321  NA 118*   < > 121*   < > 129* 130* 130*  K 4.2   < > 3.9  --  4.1  --  4.0  CL 86*   < > 89*  --  96*  --  98  CO2 23   < > 21*  --  22  --  23  GLUCOSE 119*   < > 167*  --  163*  --  170*  BUN 12   < > 16  --  15  --  23  CREATININE 0.43*   < > 0.47*  --  0.53*  --  0.48*  CALCIUM 8.8*   < > 8.9  --  9.3  --  9.1  GFRNONAA >60   < > >60  --  >60  --  >60  PROT 7.1  --  7.2  --  6.9  --   --   ALBUMIN 3.7  --  3.6  --  3.5  --   --   AST 27  --  28  --  29  --   --   ALT 15  --  31  --  39  --   --   ALKPHOS 69  --  65  --  68  --   --   BILITOT 0.6  --  0.9  --  0.8  --   --   BILIDIR  --   --  0.2  --  0.2  --   --   IBILI  --   --  0.7  --  0.6  --   --    < > = values in  this interval not displayed.    Studies:  CT Head Wo Contrast  Result Date: 11/26/2021 CLINICAL DATA:  Dizziness and disequilibrium. Symptoms for 1 month. COVID 3 weeks ago. Prostate cancer with new lung mass. EXAM: CT HEAD WITHOUT CONTRAST TECHNIQUE: Contiguous axial images were obtained from the base of the skull through the vertex without intravenous contrast. COMPARISON:  None. FINDINGS: Brain: A midline extra-axial hyperdense mass lesion surrounds the superior sagittal sinus. The lesion measures 5.9 x 3.5 x 4.4 cm. An area of hypoattenuation is present in the posterior right frontal lobe adjacent to the lesion. There is mass effect on the posterior frontal and parietal lobes bilaterally. Separate hyperdense focus is present in the cortical or subcortical left parietal lobe on axial image 24 of series 5. Similar subcortical hyperdensity is present in the left occipital lobe on axial image 17 of series 5. Areas of hypoattenuation are present in the cerebellum. The largest is in midline with some surrounding hyperdensity, also concerning for a metastatic lesion. It measures 2.2 x 2.1 cm on saddle sagittal image 29 of series 4. Smaller hypodense left cystic lesions the cerebellum are also concerning metastases. Infarct is considered less likely. A hypodense lesion in the right temporal lobe measures 11 mm. The ventricles are of normal size.  No significant fluid is present Vascular: No hyperdense vessel or unexpected calcification. Skull: Calvarium is intact. No focal lytic or blastic lesions are present. No significant extracranial soft tissue lesion is present. Sinuses/Orbits: Right maxillary sinus opacification is likely chronic. There is some wall thickening. Minimal air is present in the sinus. The paranasal sinuses and mastoid air cells are otherwise clear. The globes and orbits are within normal limits. IMPRESSION: 1. 5.9 x 3.5 x 4.4 cm midline extra-axial hyperdense mass lesion surrounds the superior  sagittal sinus. This most likely represents a metastasis. Meningioma is considered less likely. 2. Other hyperdense lesions are present in the cortical or subcortical left parietal lobe, left occipital lobe, and right temporal lobe concerning for metastatic disease. 3. There is mass effect on the posterior frontal and parietal lobes bilaterally. 4. Recommend MRI the brain without and contrast  for further evaluation of these lesions. 5. Chronic right maxillary sinus disease. These results were called by telephone at the time of interpretation on 11/26/2021 at 1:30 pm to provider Dr. Melina Copa, who verbally acknowledged these results. Electronically Signed   By: San Morelle M.D.   On: 11/26/2021 13:46   MR Brain W and Wo Contrast  Result Date: 11/28/2021 CLINICAL DATA:  Initial evaluation for brain mass. EXAM: MRI HEAD WITHOUT AND WITH CONTRAST MRV HEAD WITHOUT CONTRAST TECHNIQUE: Multiplanar, multiecho pulse sequences of the brain and surrounding structures were obtained without and with intravenous contrast. Angiographic images of the intracranial venous structures were obtained using MRV technique without intravenous contrast. CONTRAST:  49mL GADAVIST GADOBUTROL 1 MMOL/ML IV SOLN COMPARISON:  Prior CT from 11/26/2021. FINDINGS: MRI HEAD FINDINGS: Brain: Cerebral volume within normal limits. Multiple scattered intraparenchymal lesions are seen involving both cerebral hemispheres as well as the cerebellum, consistent with widespread metastatic disease. There are at least 20 lesions. For reference purposes, the largest lesion within the right cerebral hemisphere is present at the right temporal lobe in measures 1.5 x 1.2 x 0.7 cm (series 26, image 22). The largest lesion within the left cerebral hemisphere is positioned at the left occipital pole and measures 1.5 x 1.7 x 1.7 cm (series 26, image 21). Largest distinct infratentorial lesion is seen within the central cerebellum and measures 2.1 x 2.2 x 2.5 cm  (series 26, image 12). Several of these lesions are somewhat cystic in appearance. Few lesions demonstrate intrinsic T1 hyperintensity and/or SWI signal, consistent with hemorrhage and/or blood products, most notable at the central cerebellum (series 11, image 11). Mild localized edema about several of these lesions without significant regional mass effect. An additional large enhancing extra-axial mass positioned at the posterior aspect of the superior sagittal sinus measures 3.2 x 6.6 x 4.8 cm (series 28, image 13). Scattered areas of internal susceptibility artifact consistent with blood products. Mild localized vasogenic edema without significant mass effect. This lesion is somewhat indeterminate, and could reflect an additional dural-based metastasis. Concomitant meningioma could also be considered. This lesion invades and obliterates the adjacent superior sagittal sinus, better evaluated on concomitant MRV. Mild diffuse smooth pachymeningeal thickening and enhancement is likely secondary. No definite evidence for acute or subacute ischemia. Note made of a punctate focus of diffusion abnormality at the right basal ganglia, favored to reflect a small metastasis rather than infarct (series 5, image 75). No midline shift or hydrocephalus. No other extra-axial fluid collection. Pituitary gland suprasellar region within normal limits. Vascular: Major intracranial arterial vascular flow voids are maintained. Skull and upper cervical spine: Craniocervical junction within normal limits. Heterogeneous signal intensity seen within the visualized bone marrow without focal marrow replacing lesion. No scalp soft tissue abnormality. Sinuses/Orbits: Globes within normal limits. Increased CSF signal intensity seen along the optic nerve sheaths bilaterally, suggesting elevated intracranial pressures. Orbital soft tissues otherwise unremarkable. Moderate mucosal thickening within the right maxillary sinus. Paranasal sinuses are  otherwise largely clear. Small right with trace left mastoid effusions. Other: None. MRV HEAD FINDINGS: The above described dural based mass again noted at the posterior aspect of the superior sagittal sinus. The mass invades and obliterates the superior sagittal sinus at this level (series 1, image 98). Tubular filling defect is seen within the superior sagittal sinus anterior to the mass, likely a small amount of nonocclusive thrombus (series 13, images 88, 102). Superior sagittal sinus otherwise patent anteriorly. Additional extension of the lesion to involve the posterior aspect of the  suture superior sagittal sinus as it courses towards the torcula (series 12, image 82) the torcula itself is patent. Transverse and sigmoid sinuses are patent as are the visualized proximal internal jugular veins. Straight sinus, vein of Galen, inferior sagittal sinus, internal cerebral veins, and basal veins of Rosenthal are patent. No abnormality about the cavernous sinus. Superior orbital veins symmetric and within normal limits. No appreciable cortical venous thrombosis. IMPRESSION: MRI HEAD IMPRESSION: 1. 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 Mild localized edema with hemorrhagic blood products about several of these lesions without significant regional mass effect or midline shift. 2. 3.2 x 6.6 x 4.8 cm enhancing extra-axial mass at the posterior aspect of the superior sagittal sinus, indeterminate. While this finding could reflect an additional dural-based metastasis, a possible concomitant meningioma could also be considered. MRV HEAD IMPRESSION: 1. Invasion and obliteration of the posterior superior sagittal sinus by the above described dural based mass. Small amount of additional nonocclusive thrombus within the superior sagittal sinus anterior to the mass as above. 2. Remainder of the major dural sinuses are otherwise patent. Electronically Signed   By:  Jeannine Boga M.D.   On: 11/28/2021 00:09   MR Venogram Head  Result Date: 11/28/2021 CLINICAL DATA:  Initial evaluation for brain mass. EXAM: MRI HEAD WITHOUT AND WITH CONTRAST MRV HEAD WITHOUT CONTRAST TECHNIQUE: Multiplanar, multiecho pulse sequences of the brain and surrounding structures were obtained without and with intravenous contrast. Angiographic images of the intracranial venous structures were obtained using MRV technique without intravenous contrast. CONTRAST:  18mL GADAVIST GADOBUTROL 1 MMOL/ML IV SOLN COMPARISON:  Prior CT from 11/26/2021. FINDINGS: MRI HEAD FINDINGS: Brain: Cerebral volume within normal limits. Multiple scattered intraparenchymal lesions are seen involving both cerebral hemispheres as well as the cerebellum, consistent with widespread metastatic disease. There are at least 20 lesions. For reference purposes, the largest lesion within the right cerebral hemisphere is present at the right temporal lobe in measures 1.5 x 1.2 x 0.7 cm (series 26, image 22). The largest lesion within the left cerebral hemisphere is positioned at the left occipital pole and measures 1.5 x 1.7 x 1.7 cm (series 26, image 21). Largest distinct infratentorial lesion is seen within the central cerebellum and measures 2.1 x 2.2 x 2.5 cm (series 26, image 12). Several of these lesions are somewhat cystic in appearance. Few lesions demonstrate intrinsic T1 hyperintensity and/or SWI signal, consistent with hemorrhage and/or blood products, most notable at the central cerebellum (series 11, image 11). Mild localized edema about several of these lesions without significant regional mass effect. An additional large enhancing extra-axial mass positioned at the posterior aspect of the superior sagittal sinus measures 3.2 x 6.6 x 4.8 cm (series 28, image 13). Scattered areas of internal susceptibility artifact consistent with blood products. Mild localized vasogenic edema without significant mass effect. This  lesion is somewhat indeterminate, and could reflect an additional dural-based metastasis. Concomitant meningioma could also be considered. This lesion invades and obliterates the adjacent superior sagittal sinus, better evaluated on concomitant MRV. Mild diffuse smooth pachymeningeal thickening and enhancement is likely secondary. No definite evidence for acute or subacute ischemia. Note made of a punctate focus of diffusion abnormality at the right basal ganglia, favored to reflect a small metastasis rather than infarct (series 5, image 75). No midline shift or hydrocephalus. No other extra-axial fluid collection. Pituitary gland suprasellar region within normal limits. Vascular: Major intracranial arterial vascular flow voids are maintained. Skull and upper cervical spine: Craniocervical  junction within normal limits. Heterogeneous signal intensity seen within the visualized bone marrow without focal marrow replacing lesion. No scalp soft tissue abnormality. Sinuses/Orbits: Globes within normal limits. Increased CSF signal intensity seen along the optic nerve sheaths bilaterally, suggesting elevated intracranial pressures. Orbital soft tissues otherwise unremarkable. Moderate mucosal thickening within the right maxillary sinus. Paranasal sinuses are otherwise largely clear. Small right with trace left mastoid effusions. Other: None. MRV HEAD FINDINGS: The above described dural based mass again noted at the posterior aspect of the superior sagittal sinus. The mass invades and obliterates the superior sagittal sinus at this level (series 1, image 98). Tubular filling defect is seen within the superior sagittal sinus anterior to the mass, likely a small amount of nonocclusive thrombus (series 13, images 88, 102). Superior sagittal sinus otherwise patent anteriorly. Additional extension of the lesion to involve the posterior aspect of the suture superior sagittal sinus as it courses towards the torcula (series 12,  image 82) the torcula itself is patent. Transverse and sigmoid sinuses are patent as are the visualized proximal internal jugular veins. Straight sinus, vein of Galen, inferior sagittal sinus, internal cerebral veins, and basal veins of Rosenthal are patent. No abnormality about the cavernous sinus. Superior orbital veins symmetric and within normal limits. No appreciable cortical venous thrombosis. IMPRESSION: MRI HEAD IMPRESSION: 1. 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 Mild localized edema with hemorrhagic blood products about several of these lesions without significant regional mass effect or midline shift. 2. 3.2 x 6.6 x 4.8 cm enhancing extra-axial mass at the posterior aspect of the superior sagittal sinus, indeterminate. While this finding could reflect an additional dural-based metastasis, a possible concomitant meningioma could also be considered. MRV HEAD IMPRESSION: 1. Invasion and obliteration of the posterior superior sagittal sinus by the above described dural based mass. Small amount of additional nonocclusive thrombus within the superior sagittal sinus anterior to the mass as above. 2. Remainder of the major dural sinuses are otherwise patent. Electronically Signed   By: Jeannine Boga M.D.   On: 11/28/2021 00:09   CT CHEST ABDOMEN PELVIS W CONTRAST  Result Date: 11/26/2021 CLINICAL DATA:  Persistent cough, shortness of breath EXAM: CT CHEST, ABDOMEN, AND PELVIS WITH CONTRAST TECHNIQUE: Multidetector CT imaging of the chest, abdomen and pelvis was performed following the standard protocol during bolus administration of intravenous contrast. CONTRAST:  118mL OMNIPAQUE IOHEXOL 300 MG/ML  SOLN COMPARISON:  Previous studies including chest radiographs done on 11/24/2021 FINDINGS: CT CHEST FINDINGS Cardiovascular: Coronary artery calcifications are seen. There is homogeneous enhancement in thoracic aorta. There are no  intraluminal filling defects in central pulmonary artery branches. There is extrinsic compression of right main pulmonary artery caused by pathologically enlarged lymph nodes in mediastinum and right hilum. There is extrinsic pressure over the superior vena cava caused by lymphadenopathy. Mediastinum/Nodes: There is bulky lymphadenopathy in the mediastinum extending to the right hilum measuring approximately 8.6 x 8.6 x 9.5 cm. There is 2.6 x 1.9 cm node in the left hilum. Lungs/Pleura: Centrilobular emphysema is seen. In image 54 of series 4, there is 2.9 x 2.4 cm pleural-based noncalcified nodule in the right upper lobe. There are increased interstitial markings in the right upper lobe. In the image 71, there is 12 mm nodular density in the right upper lobe. In the image 73, there is 6 mm nodule in the right upper lobe. There are 3 small nodular densities each measuring less than 9 mm in size in the right  lower lobe. Musculoskeletal: Unremarkable. CT ABDOMEN PELVIS FINDINGS Hepatobiliary: Liver measures 17.5 cm in length. In the image 58 of series 2, there is 1.6 cm fluid density lesion in the left lobe, possibly cyst. There are other scattered low-density foci in the right lobe some of which may be cysts. Possibility of space-occupying neoplastic process is not excluded. There are slightly enlarged lymph nodes anterior to the liver and adjacent to the pericardium largest measuring 2.7 x 1.7 cm. There is no dilation of bile ducts. Gallbladder is not distended. Pancreas: Head of the pancreas is prominent in size. There is no dilation of pancreatic duct. No focal abnormality is seen in the body and tail of pancreas. Spleen: Spleen is not enlarged. Adrenals/Urinary Tract: There is 3 cm nodule in the left adrenal. There is 2 cm nodule in the right adrenal. There is no hydronephrosis. There are no renal or ureteral stones. There are smooth marginated nodular densities adjacent to the kidneys largest measuring 3.6 cm  posterior to the upper pole of right kidney. Density measurements in this lesions are higher than usual for simple cysts. Ureters not dilated. Urinary bladder is not distended. Stomach/Bowel: Stomach is unremarkable. Small bowel loops are not dilated. Appendix is not dilated. There is no significant wall thickening in colon. Vascular/Lymphatic: There are scattered atherosclerotic plaques and calcifications in the aorta and its major branches. There are pathologically enlarged lymph nodes in the mesentery and retroperitoneum. Largest of these nodes is noted anterior to the transverse colon measuring 5.9 x 3 cm. There are multiple other smaller nodules of varying sizes in the abdomen in the mesentery and retroperitoneum. Reproductive: Prostate is not enlarged. There is fluid density in the the region of prostatic urethra. This may suggest previous prostate surgery. Other: In image 76 of series 2, there is a 12 mm subcutaneous nodule in the right anterior abdominal wall. Small bilateral inguinal hernias containing fat are seen. Musculoskeletal: Unremarkable. IMPRESSION: There is 2.9 cm lobulated noncalcified pleural-based nodule in the right upper lobe suggesting possible primary malignant neoplasm. There are pathologically enlarged bulky lymph nodes in the mediastinum and both hilar regions, more so on the right side suggesting metastatic lymphadenopathy. There is extrinsic pressure over the right main pulmonary artery and superior vena cava by the enlarged lymph nodes in mediastinum. Increased interstitial markings and small scattered nodules are seen in the right lung which may be part of pneumonia or pulmonary metastatic disease. There are nodular densities in both adrenals largest measuring 3 cm in the left adrenal suggesting possible metastatic disease. There are enlarged lymph nodes in the retroperitoneum and mesentery largest measuring 5.9 cm in maximum diameter suggesting metastatic lymphadenopathy. There are  multiple cysts in the liver. There are other indeterminate low-density lesions of varying sizes in the liver. Possibility of hepatic metastatic disease is not excluded. Head of the pancreas is enlarged in size with lobulated margins. Possibility of neoplastic process in the head of the pancreas is not excluded. Follow-up PET-CT and biopsy as warranted should be considered. Coronary artery calcifications are seen. Other findings as described in the body of the report. Electronically Signed   By: Elmer Picker M.D.   On: 11/26/2021 13:49   CT BIOPSY  Result Date: 11/28/2021 INDICATION: 65 year old with evidence of diffuse metastatic disease. Tissue diagnosis is needed. EXAM: CT-GUIDED BIOPSY OF ANTERIOR ABDOMINAL PERITONEAL MASS MEDICATIONS: Moderate sedation ANESTHESIA/SEDATION: Moderate (conscious) sedation was employed during this procedure. A total of Versed 2.0mg  and fentanyl 100 mcg was administered intravenously at the  order of the provider performing the procedure. Total intra-service moderate sedation time: 14 minutes. Patient's level of consciousness and vital signs were monitored continuously by radiology nurse throughout the procedure under the supervision of the provider performing the procedure. FLUOROSCOPY TIME:  None COMPLICATIONS: None immediate. PROCEDURE: Informed written consent was obtained from the patient after a thorough discussion of the procedural risks, benefits and alternatives. All questions were addressed. A timeout was performed prior to the initiation of the procedure. Patient was placed supine on CT scanner. Images of the upper abdomen are obtained. The large mass in the anterior upper abdomen was identified and targeted. The skin was prepped with chlorhexidine and sterile field was created. Skin and soft tissues were anesthetized with 1% lidocaine. A small incision was made. Using CT guidance, 17 gauge coaxial needle was directed into the anterior abdominal mass. Multiple  core biopsies were obtained with an 18 gauge core device and specimens were placed in formalin. 17 gauge needle was removed without complication. Bandage placed over the puncture site. FINDINGS: Again noted is large mass in the anterior upper abdomen adjacent to the abdominal wall. This structure measures up to 5.1 cm. Needle position was confirmed within the lesion. Specimens placed in formalin. IMPRESSION: CT-guided core biopsies of the anterior upper abdominal mass. Electronically Signed   By: Markus Daft M.D.   On: 11/28/2021 12:50   DG CHEST PORT 1 VIEW  Result Date: 11/30/2021 CLINICAL DATA:  Cough, prostate cancer, COVID EXAM: PORTABLE CHEST 1 VIEW COMPARISON:  None. FINDINGS: Right Port-A-Cath tip at the cavoatrial junction. Heart is normal size. Left lung clear. Right hilar and mediastinal adenopathy again noted. Peripheral right upper lobe nodule/mass again noted. No effusions. No acute bony abnormality. IMPRESSION: Right upper lobe mass with right hilar and mediastinal adenopathy. Electronically Signed   By: Rolm Baptise M.D.   On: 11/30/2021 17:32   IR IMAGING GUIDED PORT INSERTION  Result Date: 11/28/2021 INDICATION: 65 year old with evidence of metastatic disease. Port-A-Cath needed for therapy. EXAM: FLUOROSCOPIC AND ULTRASOUND GUIDED PLACEMENT OF A SUBCUTANEOUS PORT COMPARISON:  None. MEDICATIONS: Moderate sedation ANESTHESIA/SEDATION: Versed 2.0 mg IV; Fentanyl 100 mcg IV; Moderate Sedation Time:  44 minutes The patient was continuously monitored during the procedure by the interventional radiology nurse under my direct supervision. FLUOROSCOPY TIME:  24 seconds, 1 mGy COMPLICATIONS: None immediate. PROCEDURE: The procedure, risks, benefits, and alternatives were explained to the patient. Questions regarding the procedure were encouraged and answered. The patient understands and consents to the procedure. Patient was placed supine on the interventional table. Ultrasound confirmed a patent  right internal jugular vein. Ultrasound image was saved for documentation. The right chest and neck were cleaned with a skin antiseptic and a sterile drape was placed. Maximal barrier sterile technique was utilized including caps, mask, sterile gowns, sterile gloves, sterile drape, hand hygiene and skin antiseptic. The right neck was anesthetized with 1% lidocaine. Small incision was made in the right neck with a blade. Micropuncture set was placed in the right internal jugular vein with ultrasound guidance. The micropuncture wire was used for measurement purposes. The right chest was anesthetized with 1% lidocaine with epinephrine. #15 blade was used to make an incision and a subcutaneous port pocket was formed. [place] was assembled. Subcutaneous tunnel was formed with a stiff tunneling device. The port catheter was brought through the subcutaneous tunnel. The port was placed in the subcutaneous pocket. The micropuncture set was exchanged for a peel-away sheath. The catheter was placed through  the peel-away sheath and the tip was positioned at the superior cavoatrial junction. Catheter placement was confirmed with fluoroscopy. The port was accessed and flushed with heparinized saline. The port pocket was closed using two layers of absorbable sutures and Dermabond. The vein skin site was closed using a single layer of absorbable suture and Dermabond. Sterile dressings were applied. Patient tolerated the procedure well without an immediate complication. Ultrasound and fluoroscopic images were taken and saved for this procedure. IMPRESSION: Placement of a subcutaneous power-injectable port device. Catheter tip at the superior cavoatrial junction. Electronically Signed   By: Markus Daft M.D.   On: 11/28/2021 17:48

## 2021-12-05 ENCOUNTER — Telehealth: Payer: Self-pay

## 2021-12-05 ENCOUNTER — Other Ambulatory Visit: Payer: Self-pay | Admitting: *Deleted

## 2021-12-05 ENCOUNTER — Ambulatory Visit: Payer: BC Managed Care – PPO

## 2021-12-05 ENCOUNTER — Ambulatory Visit
Admit: 2021-12-05 | Discharge: 2021-12-05 | Disposition: A | Discharge status: Home / Self Care | Payer: BC Managed Care – PPO | Attending: Radiation Oncology | Admitting: Radiation Oncology

## 2021-12-05 ENCOUNTER — Other Ambulatory Visit: Payer: Self-pay | Admitting: Hematology and Oncology

## 2021-12-05 DIAGNOSIS — C3491 Malignant neoplasm of unspecified part of right bronchus or lung: Secondary | ICD-10-CM

## 2021-12-05 LAB — BASIC METABOLIC PANEL
Anion gap: 7 (ref 5–15)
BUN: 25 mg/dL — ABNORMAL HIGH (ref 8–23)
CO2: 24 mmol/L (ref 22–32)
Calcium: 9.4 mg/dL (ref 8.9–10.3)
Chloride: 99 mmol/L (ref 98–111)
Creatinine, Ser: 0.52 mg/dL — ABNORMAL LOW (ref 0.61–1.24)
GFR, Estimated: >60 mL/min (ref >60)
Glucose, Bld: 151 mg/dL — ABNORMAL HIGH (ref 70–99)
Potassium: 4 mmol/L (ref 3.5–5.1)
Sodium: 130 mmol/L — ABNORMAL LOW (ref 135–145)

## 2021-12-05 LAB — LACTATE DEHYDROGENASE: LDH: 240 U/L — ABNORMAL HIGH (ref 98–192)

## 2021-12-05 LAB — CBC
HCT: 35.6 % — ABNORMAL LOW (ref 39.0–52.0)
Hemoglobin: 12 g/dL — ABNORMAL LOW (ref 13.0–17.0)
MCH: 31.3 pg (ref 26.0–34.0)
MCHC: 33.7 g/dL (ref 30.0–36.0)
MCV: 92.7 fL (ref 80.0–100.0)
Platelets: 205 10*3/uL (ref 150–400)
RBC: 3.84 MIL/uL — ABNORMAL LOW (ref 4.22–5.81)
RDW: 13.3 % (ref 11.5–15.5)
WBC: 13.5 10*3/uL — ABNORMAL HIGH (ref 4.0–10.5)
nRBC: 0 % (ref 0.0–0.2)

## 2021-12-05 LAB — GLUCOSE, CAPILLARY
Glucose-Capillary: 176 mg/dL — ABNORMAL HIGH (ref 70–99)
Glucose-Capillary: 194 mg/dL — ABNORMAL HIGH (ref 70–99)

## 2021-12-05 LAB — URIC ACID: Uric Acid, Serum: 2.4 mg/dL — ABNORMAL LOW (ref 3.7–8.6)

## 2021-12-05 MED ORDER — SODIUM CHLORIDE 0.9 % IV SOLN
10.0000 mg | Freq: Once | INTRAVENOUS | Status: AC
  Administered 2021-12-05: 10 mg via INTRAVENOUS
  Filled 2021-12-05: qty 1

## 2021-12-05 MED ORDER — SODIUM CHLORIDE 0.9 % IV SOLN: Freq: Once | INTRAVENOUS | Status: AC

## 2021-12-05 MED ORDER — DEXAMETHASONE 4 MG PO TABS: 4.0000 mg | ORAL_TABLET | Freq: Two times a day (BID) | ORAL | 1 refills | Status: DC

## 2021-12-05 MED ORDER — BISACODYL 10 MG RE SUPP
10.0000 mg | Freq: Once | RECTAL | Status: AC
  Administered 2021-12-05: 10 mg via RECTAL
  Filled 2021-12-05: qty 1

## 2021-12-05 MED ORDER — AMLODIPINE BESYLATE 10 MG PO TABS: 10.0000 mg | ORAL_TABLET | Freq: Every day | ORAL | 2 refills | Status: DC

## 2021-12-05 MED ORDER — LACTULOSE 10 GM/15ML PO SOLN: 10.0000 g | Freq: Two times a day (BID) | ORAL | 0 refills | Status: DC

## 2021-12-05 MED ORDER — SODIUM CHLORIDE 1 G PO TABS: 2.0000 g | ORAL_TABLET | Freq: Three times a day (TID) | ORAL | 1 refills | Status: DC

## 2021-12-05 MED ORDER — HOT PACK MISC ONCOLOGY
1.0000 | Freq: Once | Status: DC | PRN
  Filled 2021-12-05: qty 1

## 2021-12-05 MED ORDER — SODIUM CHLORIDE 0.9 % IV SOLN
100.0000 mg/m2 | Freq: Once | INTRAVENOUS | Status: AC
  Administered 2021-12-05: 240 mg via INTRAVENOUS
  Filled 2021-12-05: qty 12

## 2021-12-05 MED ORDER — DOCUSATE SODIUM 100 MG PO CAPS: 100.0000 mg | ORAL_CAPSULE | Freq: Two times a day (BID) | ORAL | 1 refills | Status: DC

## 2021-12-05 MED ORDER — FUROSEMIDE 20 MG PO TABS: 10.0000 mg | ORAL_TABLET | Freq: Every day | ORAL | 1 refills | Status: DC

## 2021-12-05 MED ORDER — POLYETHYLENE GLYCOL 3350 17 G PO PACK: 17.0000 g | PACK | Freq: Every day | ORAL | 0 refills | Status: DC

## 2021-12-05 MED ORDER — PANTOPRAZOLE SODIUM 40 MG PO TBEC: 40.0000 mg | DELAYED_RELEASE_TABLET | Freq: Every day | ORAL | 2 refills | Status: DC

## 2021-12-05 NOTE — Telephone Encounter (Signed)
Returned call to [employer]. Requesting fax # for the office for cancer policy. Fax # given. [name] will drop off fmla paper work to be filled out.

## 2021-12-05 NOTE — Progress Notes (Addendum)
Ryan [name]   DOB:[birth year]   KW#:[id number]    I have seen and examined him ASSESSMENT & PLAN:  Extensive stage high-grade neuroendocrine carcinoma, small cell type Imaging findings discussed with the patient and his significant other IWe discussed the risks, benefits, side effects of carboplatin with etoposide Given the severity of his disease and life-threatening situation, I recommend inpatient chemotherapy He started cycle 1 of carboplatin/etoposide on 12/03/2021 and tolerated well He will proceed with day 3 of cycle 1 today as planned We can start to initiate dexamethasone taper  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   I explained to the patient the rationale of not discharging him until his sodium is consistently stable over 130 due to high risk of arrhythmia and seizures Unfortunately, this will not likely to improve until he gets started on systemic treatment   History of prostate cancer Status post prostatectomy with normal PSA in October 2022 He is followed as an outpatient by his primary care provider/urology   Diabetes mellitus On sliding scale insulin Management per hospitalist I am hopeful with dexamethasone taper, this will improve   Constipation Continue Colace 100 mg twice daily and add MiraLAX daily I will add lactulose as well Discussed outpatient bowel regimen with the patient   Goals of care discussion We discussed biopsy results today and the incurable nature of his disease We discussed treatment will palliative in nature   Discharge plan The patient needs to stay in the hospital due to safety and high risk of seizures with  significant hyponatremia He has started his first cycle of chemotherapy and has tolerated well; will complete cycle 1 on Friday, 12/05/2021 Stable for discharge from our standpoint after chemotherapy and radiation completed today  All questions were answered. The patient knows to call the clinic with any problems, questions or concerns. [name], MD  Subjective:  Sitting up in the recliner  His fiance by the bedside Continues to tolerate chemotherapy well overall Denies nausea and vomiting Bowels have started to move Reports stable breathing  Objective:  Vitals:   12/04/21 2050 12/05/21 0546  BP: (!) 138/100 (!) 148/83  Pulse: 82 91  Resp: 18 18  Temp: (!) 97.5 F (36.4 C) (!) 97.5 F (36.4 C)  SpO2: 95% 95%    No intake or output data in the 24 hours ending 12/05/21 0805   GENERAL:alert, no distress and comfortable NEURO: alert & oriented x 3 with fluent speech, no focal motor/sensory deficits   Labs:  Recent Labs    11/26/21 1130 11/26/21 1329 12/02/21 0556 12/02/21 1815 12/03/21 0036 12/03/21 0805 12/04/21 0321 12/05/21 0559  NA 118*   < > 121*   < > 129* 130* 130* 130*  K 4.2   < > 3.9  --  4.1  --  4.0 4.0  CL 86*   < > 89*  --  96*  --  98 99  CO2 23   < > 21*  --  22  --  23 24  GLUCOSE 119*   < > 167*  --  163*  --  170* 151*  BUN 12   < > 16  --  15  --  23 25*  CREATININE 0.43*   < > 0.47*  --  0.53*  --  0.48* 0.52*  CALCIUM 8.8*   < > 8.9  --  9.3  --  9.1 9.4  GFRNONAA >60   < > >60  --  >60  --  >60 >60  PROT 7.1  --  7.2  --  6.9  --   --   --   ALBUMIN 3.7  --  3.6  --  3.5  --   --   --   AST 27  --  28  --  29  --   --   --   ALT 15  --  31  --  39  --   --   --   ALKPHOS 69  --  65  --  68  --   --   --   BILITOT 0.6  --  0.9  --  0.8  --   --   --   BILIDIR  --   --  0.2  --  0.2  --   --   --   IBILI  --   --  0.7  --  0.6  --   --   --    < > = values in this interval not displayed.    Studies:  CT Head Wo Contrast  Result  Date: 11/26/2021 CLINICAL DATA:  Dizziness and disequilibrium. Symptoms for 1 month. COVID 3 weeks ago. Prostate cancer with new lung mass. EXAM: CT HEAD WITHOUT CONTRAST TECHNIQUE: Contiguous axial images were obtained from the base of the skull through the vertex without intravenous contrast. COMPARISON:  None. FINDINGS: Brain: A midline extra-axial hyperdense mass lesion surrounds the superior sagittal sinus. The lesion measures 5.9 x 3.5 x 4.4 cm. An area of hypoattenuation is present in the posterior right frontal lobe adjacent to the lesion. There is mass effect on the posterior frontal and parietal lobes bilaterally. Separate hyperdense focus is present in the cortical or subcortical left parietal lobe on axial image 24 of series 5. Similar subcortical hyperdensity is present in the left occipital lobe on axial image 17 of series 5. Areas of hypoattenuation are present in the cerebellum. The largest is in midline with some surrounding hyperdensity, also concerning for a metastatic lesion. It measures 2.2 x 2.1 cm on saddle sagittal image 29 of series 4. Smaller hypodense left cystic lesions the cerebellum are also concerning metastases. Infarct is considered less likely. A hypodense lesion in the right temporal lobe measures 11 mm. The ventricles are of normal size.  No significant fluid is present Vascular: No hyperdense vessel or unexpected calcification. Skull: Calvarium is intact. No focal lytic or blastic lesions are present. No significant extracranial soft tissue lesion is present. Sinuses/Orbits: Right maxillary sinus opacification is likely chronic. There is some wall thickening. Minimal air is present in the sinus. The paranasal sinuses and mastoid air cells are otherwise clear. The globes and orbits are within normal limits. IMPRESSION: 1. 5.9 x 3.5 x 4.4 cm midline extra-axial hyperdense mass lesion surrounds the superior sagittal sinus. This most likely represents a metastasis. Meningioma is  considered less likely. 2. Other hyperdense lesions are present in the cortical or subcortical left parietal lobe, left occipital lobe, and right temporal lobe concerning for metastatic disease. 3. There is mass effect on the posterior frontal and parietal lobes bilaterally. 4. Recommend MRI the brain without and contrast for further evaluation of  these lesions. 5. Chronic right maxillary sinus disease. These results were called by telephone at the time of interpretation on 11/26/2021 at 1:30 pm to provider Dr. Melina Copa, who verbally acknowledged these results. Electronically Signed   By: San Morelle M.D.   On: 11/26/2021 13:46   MR Brain W and Wo Contrast  Result Date: 11/28/2021 CLINICAL DATA:  Initial evaluation for brain mass. EXAM: MRI HEAD WITHOUT AND WITH CONTRAST MRV HEAD WITHOUT CONTRAST TECHNIQUE: Multiplanar, multiecho pulse sequences of the brain and surrounding structures were obtained without and with intravenous contrast. Angiographic images of the intracranial venous structures were obtained using MRV technique without intravenous contrast. CONTRAST:  48mL GADAVIST GADOBUTROL 1 MMOL/ML IV SOLN COMPARISON:  Prior CT from 11/26/2021. FINDINGS: MRI HEAD FINDINGS: Brain: Cerebral volume within normal limits. Multiple scattered intraparenchymal lesions are seen involving both cerebral hemispheres as well as the cerebellum, consistent with widespread metastatic disease. There are at least 20 lesions. For reference purposes, the largest lesion within the right cerebral hemisphere is present at the right temporal lobe in measures 1.5 x 1.2 x 0.7 cm (series 26, image 22). The largest lesion within the left cerebral hemisphere is positioned at the left occipital pole and measures 1.5 x 1.7 x 1.7 cm (series 26, image 21). Largest distinct infratentorial lesion is seen within the central cerebellum and measures 2.1 x 2.2 x 2.5 cm (series 26, image 12). Several of these lesions are somewhat cystic in  appearance. Few lesions demonstrate intrinsic T1 hyperintensity and/or SWI signal, consistent with hemorrhage and/or blood products, most notable at the central cerebellum (series 11, image 11). Mild localized edema about several of these lesions without significant regional mass effect. An additional large enhancing extra-axial mass positioned at the posterior aspect of the superior sagittal sinus measures 3.2 x 6.6 x 4.8 cm (series 28, image 13). Scattered areas of internal susceptibility artifact consistent with blood products. Mild localized vasogenic edema without significant mass effect. This lesion is somewhat indeterminate, and could reflect an additional dural-based metastasis. Concomitant meningioma could also be considered. This lesion invades and obliterates the adjacent superior sagittal sinus, better evaluated on concomitant MRV. Mild diffuse smooth pachymeningeal thickening and enhancement is likely secondary. No definite evidence for acute or subacute ischemia. Note made of a punctate focus of diffusion abnormality at the right basal ganglia, favored to reflect a small metastasis rather than infarct (series 5, image 75). No midline shift or hydrocephalus. No other extra-axial fluid collection. Pituitary gland suprasellar region within normal limits. Vascular: Major intracranial arterial vascular flow voids are maintained. Skull and upper cervical spine: Craniocervical junction within normal limits. Heterogeneous signal intensity seen within the visualized bone marrow without focal marrow replacing lesion. No scalp soft tissue abnormality. Sinuses/Orbits: Globes within normal limits. Increased CSF signal intensity seen along the optic nerve sheaths bilaterally, suggesting elevated intracranial pressures. Orbital soft tissues otherwise unremarkable. Moderate mucosal thickening within the right maxillary sinus. Paranasal sinuses are otherwise largely clear. Small right with trace left mastoid effusions.  Other: None. MRV HEAD FINDINGS: The above described dural based mass again noted at the posterior aspect of the superior sagittal sinus. The mass invades and obliterates the superior sagittal sinus at this level (series 1, image 98). Tubular filling defect is seen within the superior sagittal sinus anterior to the mass, likely a small amount of nonocclusive thrombus (series 13, images 88, 102). Superior sagittal sinus otherwise patent anteriorly. Additional extension of the lesion to involve the posterior aspect of the suture superior sagittal sinus  as it courses towards the torcula (series 12, image 82) the torcula itself is patent. Transverse and sigmoid sinuses are patent as are the visualized proximal internal jugular veins. Straight sinus, vein of Galen, inferior sagittal sinus, internal cerebral veins, and basal veins of Rosenthal are patent. No abnormality about the cavernous sinus. Superior orbital veins symmetric and within normal limits. No appreciable cortical venous thrombosis. IMPRESSION: MRI HEAD IMPRESSION: 1. 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 Mild localized edema with hemorrhagic blood products about several of these lesions without significant regional mass effect or midline shift. 2. 3.2 x 6.6 x 4.8 cm enhancing extra-axial mass at the posterior aspect of the superior sagittal sinus, indeterminate. While this finding could reflect an additional dural-based metastasis, a possible concomitant meningioma could also be considered. MRV HEAD IMPRESSION: 1. Invasion and obliteration of the posterior superior sagittal sinus by the above described dural based mass. Small amount of additional nonocclusive thrombus within the superior sagittal sinus anterior to the mass as above. 2. Remainder of the major dural sinuses are otherwise patent. Electronically Signed   By: Jeannine Boga M.D.   On: 11/28/2021 00:09   MR Venogram  Head  Result Date: 11/28/2021 CLINICAL DATA:  Initial evaluation for brain mass. EXAM: MRI HEAD WITHOUT AND WITH CONTRAST MRV HEAD WITHOUT CONTRAST TECHNIQUE: Multiplanar, multiecho pulse sequences of the brain and surrounding structures were obtained without and with intravenous contrast. Angiographic images of the intracranial venous structures were obtained using MRV technique without intravenous contrast. CONTRAST:  80mL GADAVIST GADOBUTROL 1 MMOL/ML IV SOLN COMPARISON:  Prior CT from 11/26/2021. FINDINGS: MRI HEAD FINDINGS: Brain: Cerebral volume within normal limits. Multiple scattered intraparenchymal lesions are seen involving both cerebral hemispheres as well as the cerebellum, consistent with widespread metastatic disease. There are at least 20 lesions. For reference purposes, the largest lesion within the right cerebral hemisphere is present at the right temporal lobe in measures 1.5 x 1.2 x 0.7 cm (series 26, image 22). The largest lesion within the left cerebral hemisphere is positioned at the left occipital pole and measures 1.5 x 1.7 x 1.7 cm (series 26, image 21). Largest distinct infratentorial lesion is seen within the central cerebellum and measures 2.1 x 2.2 x 2.5 cm (series 26, image 12). Several of these lesions are somewhat cystic in appearance. Few lesions demonstrate intrinsic T1 hyperintensity and/or SWI signal, consistent with hemorrhage and/or blood products, most notable at the central cerebellum (series 11, image 11). Mild localized edema about several of these lesions without significant regional mass effect. An additional large enhancing extra-axial mass positioned at the posterior aspect of the superior sagittal sinus measures 3.2 x 6.6 x 4.8 cm (series 28, image 13). Scattered areas of internal susceptibility artifact consistent with blood products. Mild localized vasogenic edema without significant mass effect. This lesion is somewhat indeterminate, and could reflect an  additional dural-based metastasis. Concomitant meningioma could also be considered. This lesion invades and obliterates the adjacent superior sagittal sinus, better evaluated on concomitant MRV. Mild diffuse smooth pachymeningeal thickening and enhancement is likely secondary. No definite evidence for acute or subacute ischemia. Note made of a punctate focus of diffusion abnormality at the right basal ganglia, favored to reflect a small metastasis rather than infarct (series 5, image 75). No midline shift or hydrocephalus. No other extra-axial fluid collection. Pituitary gland suprasellar region within normal limits. Vascular: Major intracranial arterial vascular flow voids are maintained. Skull and upper cervical spine: Craniocervical junction within normal limits.  Heterogeneous signal intensity seen within the visualized bone marrow without focal marrow replacing lesion. No scalp soft tissue abnormality. Sinuses/Orbits: Globes within normal limits. Increased CSF signal intensity seen along the optic nerve sheaths bilaterally, suggesting elevated intracranial pressures. Orbital soft tissues otherwise unremarkable. Moderate mucosal thickening within the right maxillary sinus. Paranasal sinuses are otherwise largely clear. Small right with trace left mastoid effusions. Other: None. MRV HEAD FINDINGS: The above described dural based mass again noted at the posterior aspect of the superior sagittal sinus. The mass invades and obliterates the superior sagittal sinus at this level (series 1, image 98). Tubular filling defect is seen within the superior sagittal sinus anterior to the mass, likely a small amount of nonocclusive thrombus (series 13, images 88, 102). Superior sagittal sinus otherwise patent anteriorly. Additional extension of the lesion to involve the posterior aspect of the suture superior sagittal sinus as it courses towards the torcula (series 12, image 82) the torcula itself is patent. Transverse and  sigmoid sinuses are patent as are the visualized proximal internal jugular veins. Straight sinus, vein of Galen, inferior sagittal sinus, internal cerebral veins, and basal veins of Rosenthal are patent. No abnormality about the cavernous sinus. Superior orbital veins symmetric and within normal limits. No appreciable cortical venous thrombosis. IMPRESSION: MRI HEAD IMPRESSION: 1. 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 Mild localized edema with hemorrhagic blood products about several of these lesions without significant regional mass effect or midline shift. 2. 3.2 x 6.6 x 4.8 cm enhancing extra-axial mass at the posterior aspect of the superior sagittal sinus, indeterminate. While this finding could reflect an additional dural-based metastasis, a possible concomitant meningioma could also be considered. MRV HEAD IMPRESSION: 1. Invasion and obliteration of the posterior superior sagittal sinus by the above described dural based mass. Small amount of additional nonocclusive thrombus within the superior sagittal sinus anterior to the mass as above. 2. Remainder of the major dural sinuses are otherwise patent. Electronically Signed   By: Jeannine Boga M.D.   On: 11/28/2021 00:09   CT CHEST ABDOMEN PELVIS W CONTRAST  Result Date: 11/26/2021 CLINICAL DATA:  Persistent cough, shortness of breath EXAM: CT CHEST, ABDOMEN, AND PELVIS WITH CONTRAST TECHNIQUE: Multidetector CT imaging of the chest, abdomen and pelvis was performed following the standard protocol during bolus administration of intravenous contrast. CONTRAST:  142mL OMNIPAQUE IOHEXOL 300 MG/ML  SOLN COMPARISON:  Previous studies including chest radiographs done on 11/24/2021 FINDINGS: CT CHEST FINDINGS Cardiovascular: Coronary artery calcifications are seen. There is homogeneous enhancement in thoracic aorta. There are no intraluminal filling defects in central pulmonary artery  branches. There is extrinsic compression of right main pulmonary artery caused by pathologically enlarged lymph nodes in mediastinum and right hilum. There is extrinsic pressure over the superior vena cava caused by lymphadenopathy. Mediastinum/Nodes: There is bulky lymphadenopathy in the mediastinum extending to the right hilum measuring approximately 8.6 x 8.6 x 9.5 cm. There is 2.6 x 1.9 cm node in the left hilum. Lungs/Pleura: Centrilobular emphysema is seen. In image 54 of series 4, there is 2.9 x 2.4 cm pleural-based noncalcified nodule in the right upper lobe. There are increased interstitial markings in the right upper lobe. In the image 71, there is 12 mm nodular density in the right upper lobe. In the image 73, there is 6 mm nodule in the right upper lobe. There are 3 small nodular densities each measuring less than 9 mm in size in the right lower lobe. Musculoskeletal: Unremarkable.  CT ABDOMEN PELVIS FINDINGS Hepatobiliary: Liver measures 17.5 cm in length. In the image 58 of series 2, there is 1.6 cm fluid density lesion in the left lobe, possibly cyst. There are other scattered low-density foci in the right lobe some of which may be cysts. Possibility of space-occupying neoplastic process is not excluded. There are slightly enlarged lymph nodes anterior to the liver and adjacent to the pericardium largest measuring 2.7 x 1.7 cm. There is no dilation of bile ducts. Gallbladder is not distended. Pancreas: Head of the pancreas is prominent in size. There is no dilation of pancreatic duct. No focal abnormality is seen in the body and tail of pancreas. Spleen: Spleen is not enlarged. Adrenals/Urinary Tract: There is 3 cm nodule in the left adrenal. There is 2 cm nodule in the right adrenal. There is no hydronephrosis. There are no renal or ureteral stones. There are smooth marginated nodular densities adjacent to the kidneys largest measuring 3.6 cm posterior to the upper pole of right kidney. Density  measurements in this lesions are higher than usual for simple cysts. Ureters not dilated. Urinary bladder is not distended. Stomach/Bowel: Stomach is unremarkable. Small bowel loops are not dilated. Appendix is not dilated. There is no significant wall thickening in colon. Vascular/Lymphatic: There are scattered atherosclerotic plaques and calcifications in the aorta and its major branches. There are pathologically enlarged lymph nodes in the mesentery and retroperitoneum. Largest of these nodes is noted anterior to the transverse colon measuring 5.9 x 3 cm. There are multiple other smaller nodules of varying sizes in the abdomen in the mesentery and retroperitoneum. Reproductive: Prostate is not enlarged. There is fluid density in the the region of prostatic urethra. This may suggest previous prostate surgery. Other: In image 76 of series 2, there is a 12 mm subcutaneous nodule in the right anterior abdominal wall. Small bilateral inguinal hernias containing fat are seen. Musculoskeletal: Unremarkable. IMPRESSION: There is 2.9 cm lobulated noncalcified pleural-based nodule in the right upper lobe suggesting possible primary malignant neoplasm. There are pathologically enlarged bulky lymph nodes in the mediastinum and both hilar regions, more so on the right side suggesting metastatic lymphadenopathy. There is extrinsic pressure over the right main pulmonary artery and superior vena cava by the enlarged lymph nodes in mediastinum. Increased interstitial markings and small scattered nodules are seen in the right lung which may be part of pneumonia or pulmonary metastatic disease. There are nodular densities in both adrenals largest measuring 3 cm in the left adrenal suggesting possible metastatic disease. There are enlarged lymph nodes in the retroperitoneum and mesentery largest measuring 5.9 cm in maximum diameter suggesting metastatic lymphadenopathy. There are multiple cysts in the liver. There are other  indeterminate low-density lesions of varying sizes in the liver. Possibility of hepatic metastatic disease is not excluded. Head of the pancreas is enlarged in size with lobulated margins. Possibility of neoplastic process in the head of the pancreas is not excluded. Follow-up PET-CT and biopsy as warranted should be considered. Coronary artery calcifications are seen. Other findings as described in the body of the report. Electronically Signed   By: Elmer Picker M.D.   On: 11/26/2021 13:49   CT BIOPSY  Result Date: 11/28/2021 INDICATION: 65 year old with evidence of diffuse metastatic disease. Tissue diagnosis is needed. EXAM: CT-GUIDED BIOPSY OF ANTERIOR ABDOMINAL PERITONEAL MASS MEDICATIONS: Moderate sedation ANESTHESIA/SEDATION: Moderate (conscious) sedation was employed during this procedure. A total of Versed 2.0mg  and fentanyl 100 mcg was administered intravenously at the order of the provider  performing the procedure. Total intra-service moderate sedation time: 14 minutes. Patient's level of consciousness and vital signs were monitored continuously by radiology nurse throughout the procedure under the supervision of the provider performing the procedure. FLUOROSCOPY TIME:  None COMPLICATIONS: None immediate. PROCEDURE: Informed written consent was obtained from the patient after a thorough discussion of the procedural risks, benefits and alternatives. All questions were addressed. A timeout was performed prior to the initiation of the procedure. Patient was placed supine on CT scanner. Images of the upper abdomen are obtained. The large mass in the anterior upper abdomen was identified and targeted. The skin was prepped with chlorhexidine and sterile field was created. Skin and soft tissues were anesthetized with 1% lidocaine. A small incision was made. Using CT guidance, 17 gauge coaxial needle was directed into the anterior abdominal mass. Multiple core biopsies were obtained with an 18 gauge  core device and specimens were placed in formalin. 17 gauge needle was removed without complication. Bandage placed over the puncture site. FINDINGS: Again noted is large mass in the anterior upper abdomen adjacent to the abdominal wall. This structure measures up to 5.1 cm. Needle position was confirmed within the lesion. Specimens placed in formalin. IMPRESSION: CT-guided core biopsies of the anterior upper abdominal mass. Electronically Signed   By: Markus Daft M.D.   On: 11/28/2021 12:50   DG CHEST PORT 1 VIEW  Result Date: 11/30/2021 CLINICAL DATA:  Cough, prostate cancer, COVID EXAM: PORTABLE CHEST 1 VIEW COMPARISON:  None. FINDINGS: Right Port-A-Cath tip at the cavoatrial junction. Heart is normal size. Left lung clear. Right hilar and mediastinal adenopathy again noted. Peripheral right upper lobe nodule/mass again noted. No effusions. No acute bony abnormality. IMPRESSION: Right upper lobe mass with right hilar and mediastinal adenopathy. Electronically Signed   By: Rolm Baptise M.D.   On: 11/30/2021 17:32   IR IMAGING GUIDED PORT INSERTION  Result Date: 11/28/2021 INDICATION: 65 year old with evidence of metastatic disease. Port-A-Cath needed for therapy. EXAM: FLUOROSCOPIC AND ULTRASOUND GUIDED PLACEMENT OF A SUBCUTANEOUS PORT COMPARISON:  None. MEDICATIONS: Moderate sedation ANESTHESIA/SEDATION: Versed 2.0 mg IV; Fentanyl 100 mcg IV; Moderate Sedation Time:  44 minutes The patient was continuously monitored during the procedure by the interventional radiology nurse under my direct supervision. FLUOROSCOPY TIME:  24 seconds, 1 mGy COMPLICATIONS: None immediate. PROCEDURE: The procedure, risks, benefits, and alternatives were explained to the patient. Questions regarding the procedure were encouraged and answered. The patient understands and consents to the procedure. Patient was placed supine on the interventional table. Ultrasound confirmed a patent right internal jugular vein. Ultrasound image  was saved for documentation. The right chest and neck were cleaned with a skin antiseptic and a sterile drape was placed. Maximal barrier sterile technique was utilized including caps, mask, sterile gowns, sterile gloves, sterile drape, hand hygiene and skin antiseptic. The right neck was anesthetized with 1% lidocaine. Small incision was made in the right neck with a blade. Micropuncture set was placed in the right internal jugular vein with ultrasound guidance. The micropuncture wire was used for measurement purposes. The right chest was anesthetized with 1% lidocaine with epinephrine. #15 blade was used to make an incision and a subcutaneous port pocket was formed. [place] was assembled. Subcutaneous tunnel was formed with a stiff tunneling device. The port catheter was brought through the subcutaneous tunnel. The port was placed in the subcutaneous pocket. The micropuncture set was exchanged for a peel-away sheath. The catheter was placed through the peel-away sheath and  the tip was positioned at the superior cavoatrial junction. Catheter placement was confirmed with fluoroscopy. The port was accessed and flushed with heparinized saline. The port pocket was closed using two layers of absorbable sutures and Dermabond. The vein skin site was closed using a single layer of absorbable suture and Dermabond. Sterile dressings were applied. Patient tolerated the procedure well without an immediate complication. Ultrasound and fluoroscopic images were taken and saved for this procedure. IMPRESSION: Placement of a subcutaneous power-injectable port device. Catheter tip at the superior cavoatrial junction. Electronically Signed   By: Markus Daft M.D.   On: 11/28/2021 17:48    Future Appointments  Date Time Provider [place]  12/05/2021 12:00 PM [facility] LINAC 4 [facility] None  12/08/2021 12:35 PM [facility] LINAC 4 [facility] None  12/08/2021  1:15 PM [facility] [place] FLUSH [facility] None   12/08/2021  2:00 PM [facility] CHEMO EDU [facility] None  12/08/2021  3:30 PM Alvy Bimler, Georgena Weisheit, MD [facility] None  12/08/2021  4:00 PM [facility] [place] FLUSH [facility] None  12/09/2021 12:30 PM [facility] LINAC 3 [facility] None  12/10/2021  3:00 PM [facility] LINAC 3 [facility] None  12/11/2021 12:45 PM [facility] [id number] [facility] None  12/12/2021 11:15 AM Morrell Riddle, RD [facility] None  12/12/2021  1:45 PM [facility] LINAC 3 [facility] None  12/16/2021  2:30 PM [facility] LINAC 3 [facility] None

## 2021-12-05 NOTE — Discharge Summary (Signed)
Physician Discharge Summary  Ryan [name] GYI:[id number] DOB: [birth year] DOA: 11/26/2021  PCP: [name], DO  Admit date: 11/26/2021 Discharge date: 12/05/2021  Admitted From: Home Disposition:  Home   Recommendations for Outpatient Follow-up:  Follow up with PCP and Oncology   Discharge Condition: Stable CODE STATUS: Full  Diet recommendation: Regular   Brief/Interim Summary: Ryan [name] is a 65 years old male with PMH significant for prostate cancer, type 2 diabetes presented in the ED with 3 weeks history of progressive weakness, decreased appetite, 20 pound weight loss.  Patient also been experiencing episodes of severe dizziness and disequilibrium. He reports he was diagnosed with COVID 3 weeks ago.  Work-up in the ED shows sodium 118.  CT chest showed 2.9 cm lobulated noncalcified pleural-based nodule in the right upper lobe suggesting possible primary malignant neoplasm.  There is multiple lesions noted in the brain, adrenal gland and nearby lymph nodes suggesting metastasis. Also found to have acute hyponatremia that could be secondary to SIADH in the setting of new onset metastatic lung cancer.  Patient underwent CT-guided biopsy of abdominal peritoneal mass.  Patient also underwent Port-A-Cath placement for chemotherapy. Patient started radiation treatment and chemotherapy. Nephrology consulted for hyponatremia, which improved with tx.   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 -Appreciate nephrology.  Patient is status post tolvaptan.  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 IR consulted for biopsy of peritoneal lesion. MRI brain confirms the metastatic lesions in the brain. Invasion and obliteration of the posterior superior sagittal sinus by the above described dural based mass. Patient underwent successful CT-guided biopsy of the abdominal mass, He also underwent Port-A-Cath placement by IR on 12/9. Patient started radiation treatment on 12/12.  Chemo started 12/14 Followed by Dr. 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 Should get better with radiation.   History of prostate cancer: S/p prostatectomy with normal PSA.   Diabetes mellitus: -SSI   Essential hypertension: Continue amlodipine 10 mg daily.   Constipation: Continue senna and Colace. MiraLAX daily. Enema today    Recent COVID -Past need for isolation    Discharge Instructions  Discharge Instructions     Call MD for:  difficulty breathing, headache or visual disturbances   Complete by: As directed    Call MD for:  extreme fatigue   Complete by: As directed    Call MD for:  persistant dizziness or light-headedness   Complete by: As directed    Call MD for:  persistant nausea and vomiting   Complete by: As directed    Call MD for:  redness, tenderness, or signs of infection (pain, swelling, redness, odor or green/yellow discharge around incision site)   Complete by: As directed    Call MD for:  severe uncontrolled pain   Complete by: As directed    Call MD for:  temperature >100.4   Complete by: As directed    Diet - low sodium heart healthy   Complete by: As directed  Fluid restriction of 1080ml/day   Discharge instructions   Complete by: As directed    You were cared for by a hospitalist during your hospital stay. If you have any questions about your discharge medications or the care you received while you were in the hospital after you are  discharged, you can call the unit and ask to speak with the hospitalist on call if the hospitalist that took care of you is not available. Once you are discharged, your primary care physician will handle any further medical issues. Please note that NO REFILLS for any discharge medications will be authorized once you are discharged, as it is imperative that you return to your primary care physician (or establish a relationship with a primary care physician if you do not have one) for your aftercare needs so that they can reassess your need for medications and monitor your lab values.   Increase activity slowly   Complete by: As directed    No wound care   Complete by: As directed    SCHEDULING COMMUNICATION   Complete by: As directed    Chemotherapy Appointment  - 3 hour    TREATMENT CONDITIONS   Complete by: As directed    Patient should have CBC & CMP within 7 days prior to chemotherapy administration. NOTIFY MD IF: ANC < 1500, Hemoglobin < 8, PLT < 100,000,  Total Bili > 1.5, Creatinine > 1.5, ALT & AST > 80 or if patient has unstable vital signs: Temperature > 38.5, SBP > 180 or < 90, RR > 30 or HR > 100.      Allergies as of 12/05/2021       Reactions   Hydralazine Shortness Of Breath, Swelling, Rash   Given hydralazine x1 hour prior to allergic reaction.   Percocet [oxycodone-acetaminophen] Anaphylaxis   Given within 1 hour of symptom onset        Medication List     STOP taking these medications    lisinopril 20 MG tablet Commonly known as: ZESTRIL   NovoLOG Mix 70/30 FlexPen (70-30) 100 UNIT/ML FlexPen Generic drug: insulin aspart protamine - aspart       TAKE these medications    Abdominal Binder/Elastic 2XL Misc 1 Units by Does not apply route daily.   amLODipine 10 MG tablet Commonly known as: NORVASC Take 1 tablet (10 mg total) by mouth daily. Start taking on: December 06, 2021   atorvastatin 20 MG tablet Commonly known as: LIPITOR Take 20 mg by mouth  daily.   dexamethasone 4 MG tablet Commonly known as: DECADRON Take 1 tablet (4 mg total) by mouth 2 (two) times daily.   docusate sodium 100 MG capsule Commonly known as: COLACE Take 1 capsule (100 mg total) by mouth 2 (two) times daily.   FeroSul 325 (65 FE) MG tablet Generic drug: ferrous sulfate Take 325 mg by mouth every other day.   furosemide 20 MG tablet Commonly known as: LASIX Take 0.5 tablets (10 mg total) by mouth daily. Start taking on: December 06, 2021   lactulose 10 GM/15ML solution Commonly known as: CHRONULAC Take 15 mLs (10 g total) by mouth 2 (two) times daily.   memantine 5 MG tablet Commonly known as: Namenda Begin this prescription the first day of brain radiation. Week 1: take one tablet po qam. Week 2: take one tablet qam and qpm. Week 3: take two tablets qam, and one tablet po q pm. Week 4: take two tablets qam and qpm. Fill subsequent prescription q  month.   memantine 10 MG tablet Commonly known as: Namenda Take 1 tablet (10 mg total) by mouth 2 (two) times daily.   metFORMIN 1000 MG tablet Commonly known as: GLUCOPHAGE Take 500-1,000 mg by mouth 2 (two) times daily with a meal.   pantoprazole 40 MG tablet Commonly known as: PROTONIX Take 1 tablet (40 mg total) by mouth daily. Start taking on: December 06, 2021   polyethylene glycol 17 g packet Commonly known as: MIRALAX / GLYCOLAX Take 17 g by mouth daily. Start taking on: December 06, 2021   sodium chloride 1 g tablet Take 2 tablets (2 g total) by mouth 3 (three) times daily with meals.   zolpidem 10 MG tablet Commonly known as: AMBIEN Take 10 mg by mouth at bedtime as needed for sleep.        Follow-up Information     [name], DO Follow up.   Specialty: Internal Medicine Contact information: [address] [place] [address] [phone]         [name], MD Follow up.   Specialty: Hematology and Oncology Contact information: [place] [address] [phone]                Allergies  Allergen Reactions   Hydralazine Shortness Of Breath, Swelling and Rash    Given hydralazine x1 hour prior to allergic reaction.   Percocet [Oxycodone-Acetaminophen] Anaphylaxis    Given within 1 hour of symptom onset    Consultations: Oncology IR Nephrology    Procedures/Studies: CT Head Wo Contrast  Result Date: 11/26/2021 CLINICAL DATA:  Dizziness and disequilibrium. Symptoms for 1 month. COVID 3 weeks ago. Prostate cancer with new lung mass. EXAM: CT HEAD WITHOUT CONTRAST TECHNIQUE: Contiguous axial images were obtained from the base of the skull through the vertex without intravenous contrast. COMPARISON:  None. FINDINGS: Brain: A midline extra-axial hyperdense mass lesion surrounds the superior sagittal sinus. The lesion measures 5.9 x 3.5 x 4.4 cm. An area of hypoattenuation is present in the posterior right frontal lobe adjacent to the lesion. There is mass effect on the posterior frontal and parietal lobes bilaterally. Separate hyperdense focus is present in the cortical or subcortical left parietal lobe on axial image 24 of series 5. Similar subcortical hyperdensity is present in the left occipital lobe on axial image 17 of series 5. Areas of hypoattenuation are present in the cerebellum. The largest is in midline with some surrounding hyperdensity, also concerning for a metastatic lesion. It measures 2.2 x 2.1 cm on saddle sagittal image 29 of series 4. Smaller hypodense left cystic lesions the cerebellum are also concerning metastases. Infarct is considered less likely. A hypodense lesion in the right temporal lobe measures 11 mm. The ventricles are of normal size.  No significant fluid is present Vascular: No hyperdense vessel or unexpected calcification. Skull: Calvarium is intact. No focal lytic or blastic lesions are present. No significant extracranial soft tissue lesion is present.  Sinuses/Orbits: Right maxillary sinus opacification is likely chronic. There is some wall thickening. Minimal air is present in the sinus. The paranasal sinuses and mastoid air cells are otherwise clear. The globes and orbits are within normal limits. IMPRESSION: 1. 5.9 x 3.5 x 4.4 cm midline extra-axial hyperdense mass lesion surrounds the superior sagittal sinus. This most likely represents a metastasis. Meningioma is considered less likely. 2. Other hyperdense lesions are present in the cortical or subcortical left parietal lobe, left occipital lobe, and right temporal  lobe concerning for metastatic disease. 3. There is mass effect on the posterior frontal and parietal lobes bilaterally. 4. Recommend MRI the brain without and contrast for further evaluation of these lesions. 5. Chronic right maxillary sinus disease. These results were called by telephone at the time of interpretation on 11/26/2021 at 1:30 pm to provider Dr. Melina Copa, who verbally acknowledged these results. Electronically Signed   By: San Morelle M.D.   On: 11/26/2021 13:46   MR Brain W and Wo Contrast  Result Date: 11/28/2021 CLINICAL DATA:  Initial evaluation for brain mass. EXAM: MRI HEAD WITHOUT AND WITH CONTRAST MRV HEAD WITHOUT CONTRAST TECHNIQUE: Multiplanar, multiecho pulse sequences of the brain and surrounding structures were obtained without and with intravenous contrast. Angiographic images of the intracranial venous structures were obtained using MRV technique without intravenous contrast. CONTRAST:  54mL GADAVIST GADOBUTROL 1 MMOL/ML IV SOLN COMPARISON:  Prior CT from 11/26/2021. FINDINGS: MRI HEAD FINDINGS: Brain: Cerebral volume within normal limits. Multiple scattered intraparenchymal lesions are seen involving both cerebral hemispheres as well as the cerebellum, consistent with widespread metastatic disease. There are at least 20 lesions. For reference purposes, the largest lesion within the right cerebral hemisphere  is present at the right temporal lobe in measures 1.5 x 1.2 x 0.7 cm (series 26, image 22). The largest lesion within the left cerebral hemisphere is positioned at the left occipital pole and measures 1.5 x 1.7 x 1.7 cm (series 26, image 21). Largest distinct infratentorial lesion is seen within the central cerebellum and measures 2.1 x 2.2 x 2.5 cm (series 26, image 12). Several of these lesions are somewhat cystic in appearance. Few lesions demonstrate intrinsic T1 hyperintensity and/or SWI signal, consistent with hemorrhage and/or blood products, most notable at the central cerebellum (series 11, image 11). Mild localized edema about several of these lesions without significant regional mass effect. An additional large enhancing extra-axial mass positioned at the posterior aspect of the superior sagittal sinus measures 3.2 x 6.6 x 4.8 cm (series 28, image 13). Scattered areas of internal susceptibility artifact consistent with blood products. Mild localized vasogenic edema without significant mass effect. This lesion is somewhat indeterminate, and could reflect an additional dural-based metastasis. Concomitant meningioma could also be considered. This lesion invades and obliterates the adjacent superior sagittal sinus, better evaluated on concomitant MRV. Mild diffuse smooth pachymeningeal thickening and enhancement is likely secondary. No definite evidence for acute or subacute ischemia. Note made of a punctate focus of diffusion abnormality at the right basal ganglia, favored to reflect a small metastasis rather than infarct (series 5, image 75). No midline shift or hydrocephalus. No other extra-axial fluid collection. Pituitary gland suprasellar region within normal limits. Vascular: Major intracranial arterial vascular flow voids are maintained. Skull and upper cervical spine: Craniocervical junction within normal limits. Heterogeneous signal intensity seen within the visualized bone marrow without focal  marrow replacing lesion. No scalp soft tissue abnormality. Sinuses/Orbits: Globes within normal limits. Increased CSF signal intensity seen along the optic nerve sheaths bilaterally, suggesting elevated intracranial pressures. Orbital soft tissues otherwise unremarkable. Moderate mucosal thickening within the right maxillary sinus. Paranasal sinuses are otherwise largely clear. Small right with trace left mastoid effusions. Other: None. MRV HEAD FINDINGS: The above described dural based mass again noted at the posterior aspect of the superior sagittal sinus. The mass invades and obliterates the superior sagittal sinus at this level (series 1, image 98). Tubular filling defect is seen within the superior sagittal sinus anterior to the mass, likely a small amount  of nonocclusive thrombus (series 13, images 88, 102). Superior sagittal sinus otherwise patent anteriorly. Additional extension of the lesion to involve the posterior aspect of the suture superior sagittal sinus as it courses towards the torcula (series 12, image 82) the torcula itself is patent. Transverse and sigmoid sinuses are patent as are the visualized proximal internal jugular veins. Straight sinus, vein of Galen, inferior sagittal sinus, internal cerebral veins, and basal veins of Rosenthal are patent. No abnormality about the cavernous sinus. Superior orbital veins symmetric and within normal limits. No appreciable cortical venous thrombosis. IMPRESSION: MRI HEAD IMPRESSION: 1. 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 Mild localized edema with hemorrhagic blood products about several of these lesions without significant regional mass effect or midline shift. 2. 3.2 x 6.6 x 4.8 cm enhancing extra-axial mass at the posterior aspect of the superior sagittal sinus, indeterminate. While this finding could reflect an additional dural-based metastasis, a possible concomitant meningioma could  also be considered. MRV HEAD IMPRESSION: 1. Invasion and obliteration of the posterior superior sagittal sinus by the above described dural based mass. Small amount of additional nonocclusive thrombus within the superior sagittal sinus anterior to the mass as above. 2. Remainder of the major dural sinuses are otherwise patent. Electronically Signed   By: Jeannine Boga M.D.   On: 11/28/2021 00:09   MR Venogram Head  Result Date: 11/28/2021 CLINICAL DATA:  Initial evaluation for brain mass. EXAM: MRI HEAD WITHOUT AND WITH CONTRAST MRV HEAD WITHOUT CONTRAST TECHNIQUE: Multiplanar, multiecho pulse sequences of the brain and surrounding structures were obtained without and with intravenous contrast. Angiographic images of the intracranial venous structures were obtained using MRV technique without intravenous contrast. CONTRAST:  74mL GADAVIST GADOBUTROL 1 MMOL/ML IV SOLN COMPARISON:  Prior CT from 11/26/2021. FINDINGS: MRI HEAD FINDINGS: Brain: Cerebral volume within normal limits. Multiple scattered intraparenchymal lesions are seen involving both cerebral hemispheres as well as the cerebellum, consistent with widespread metastatic disease. There are at least 20 lesions. For reference purposes, the largest lesion within the right cerebral hemisphere is present at the right temporal lobe in measures 1.5 x 1.2 x 0.7 cm (series 26, image 22). The largest lesion within the left cerebral hemisphere is positioned at the left occipital pole and measures 1.5 x 1.7 x 1.7 cm (series 26, image 21). Largest distinct infratentorial lesion is seen within the central cerebellum and measures 2.1 x 2.2 x 2.5 cm (series 26, image 12). Several of these lesions are somewhat cystic in appearance. Few lesions demonstrate intrinsic T1 hyperintensity and/or SWI signal, consistent with hemorrhage and/or blood products, most notable at the central cerebellum (series 11, image 11). Mild localized edema about several of these lesions  without significant regional mass effect. An additional large enhancing extra-axial mass positioned at the posterior aspect of the superior sagittal sinus measures 3.2 x 6.6 x 4.8 cm (series 28, image 13). Scattered areas of internal susceptibility artifact consistent with blood products. Mild localized vasogenic edema without significant mass effect. This lesion is somewhat indeterminate, and could reflect an additional dural-based metastasis. Concomitant meningioma could also be considered. This lesion invades and obliterates the adjacent superior sagittal sinus, better evaluated on concomitant MRV. Mild diffuse smooth pachymeningeal thickening and enhancement is likely secondary. No definite evidence for acute or subacute ischemia. Note made of a punctate focus of diffusion abnormality at the right basal ganglia, favored to reflect a small metastasis rather than infarct (series 5, image 75). No midline shift or hydrocephalus. No  other extra-axial fluid collection. Pituitary gland suprasellar region within normal limits. Vascular: Major intracranial arterial vascular flow voids are maintained. Skull and upper cervical spine: Craniocervical junction within normal limits. Heterogeneous signal intensity seen within the visualized bone marrow without focal marrow replacing lesion. No scalp soft tissue abnormality. Sinuses/Orbits: Globes within normal limits. Increased CSF signal intensity seen along the optic nerve sheaths bilaterally, suggesting elevated intracranial pressures. Orbital soft tissues otherwise unremarkable. Moderate mucosal thickening within the right maxillary sinus. Paranasal sinuses are otherwise largely clear. Small right with trace left mastoid effusions. Other: None. MRV HEAD FINDINGS: The above described dural based mass again noted at the posterior aspect of the superior sagittal sinus. The mass invades and obliterates the superior sagittal sinus at this level (series 1, image 98). Tubular  filling defect is seen within the superior sagittal sinus anterior to the mass, likely a small amount of nonocclusive thrombus (series 13, images 88, 102). Superior sagittal sinus otherwise patent anteriorly. Additional extension of the lesion to involve the posterior aspect of the suture superior sagittal sinus as it courses towards the torcula (series 12, image 82) the torcula itself is patent. Transverse and sigmoid sinuses are patent as are the visualized proximal internal jugular veins. Straight sinus, vein of Galen, inferior sagittal sinus, internal cerebral veins, and basal veins of Rosenthal are patent. No abnormality about the cavernous sinus. Superior orbital veins symmetric and within normal limits. No appreciable cortical venous thrombosis. IMPRESSION: MRI HEAD IMPRESSION: 1. 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 Mild localized edema with hemorrhagic blood products about several of these lesions without significant regional mass effect or midline shift. 2. 3.2 x 6.6 x 4.8 cm enhancing extra-axial mass at the posterior aspect of the superior sagittal sinus, indeterminate. While this finding could reflect an additional dural-based metastasis, a possible concomitant meningioma could also be considered. MRV HEAD IMPRESSION: 1. Invasion and obliteration of the posterior superior sagittal sinus by the above described dural based mass. Small amount of additional nonocclusive thrombus within the superior sagittal sinus anterior to the mass as above. 2. Remainder of the major dural sinuses are otherwise patent. Electronically Signed   By: Jeannine Boga M.D.   On: 11/28/2021 00:09   CT CHEST ABDOMEN PELVIS W CONTRAST  Result Date: 11/26/2021 CLINICAL DATA:  Persistent cough, shortness of breath EXAM: CT CHEST, ABDOMEN, AND PELVIS WITH CONTRAST TECHNIQUE: Multidetector CT imaging of the chest, abdomen and pelvis was performed following the  standard protocol during bolus administration of intravenous contrast. CONTRAST:  184mL OMNIPAQUE IOHEXOL 300 MG/ML  SOLN COMPARISON:  Previous studies including chest radiographs done on 11/24/2021 FINDINGS: CT CHEST FINDINGS Cardiovascular: Coronary artery calcifications are seen. There is homogeneous enhancement in thoracic aorta. There are no intraluminal filling defects in central pulmonary artery branches. There is extrinsic compression of right main pulmonary artery caused by pathologically enlarged lymph nodes in mediastinum and right hilum. There is extrinsic pressure over the superior vena cava caused by lymphadenopathy. Mediastinum/Nodes: There is bulky lymphadenopathy in the mediastinum extending to the right hilum measuring approximately 8.6 x 8.6 x 9.5 cm. There is 2.6 x 1.9 cm node in the left hilum. Lungs/Pleura: Centrilobular emphysema is seen. In image 54 of series 4, there is 2.9 x 2.4 cm pleural-based noncalcified nodule in the right upper lobe. There are increased interstitial markings in the right upper lobe. In the image 71, there is 12 mm nodular density in the right upper lobe. In the image 73, there  is 6 mm nodule in the right upper lobe. There are 3 small nodular densities each measuring less than 9 mm in size in the right lower lobe. Musculoskeletal: Unremarkable. CT ABDOMEN PELVIS FINDINGS Hepatobiliary: Liver measures 17.5 cm in length. In the image 58 of series 2, there is 1.6 cm fluid density lesion in the left lobe, possibly cyst. There are other scattered low-density foci in the right lobe some of which may be cysts. Possibility of space-occupying neoplastic process is not excluded. There are slightly enlarged lymph nodes anterior to the liver and adjacent to the pericardium largest measuring 2.7 x 1.7 cm. There is no dilation of bile ducts. Gallbladder is not distended. Pancreas: Head of the pancreas is prominent in size. There is no dilation of pancreatic duct. No focal  abnormality is seen in the body and tail of pancreas. Spleen: Spleen is not enlarged. Adrenals/Urinary Tract: There is 3 cm nodule in the left adrenal. There is 2 cm nodule in the right adrenal. There is no hydronephrosis. There are no renal or ureteral stones. There are smooth marginated nodular densities adjacent to the kidneys largest measuring 3.6 cm posterior to the upper pole of right kidney. Density measurements in this lesions are higher than usual for simple cysts. Ureters not dilated. Urinary bladder is not distended. Stomach/Bowel: Stomach is unremarkable. Small bowel loops are not dilated. Appendix is not dilated. There is no significant wall thickening in colon. Vascular/Lymphatic: There are scattered atherosclerotic plaques and calcifications in the aorta and its major branches. There are pathologically enlarged lymph nodes in the mesentery and retroperitoneum. Largest of these nodes is noted anterior to the transverse colon measuring 5.9 x 3 cm. There are multiple other smaller nodules of varying sizes in the abdomen in the mesentery and retroperitoneum. Reproductive: Prostate is not enlarged. There is fluid density in the the region of prostatic urethra. This may suggest previous prostate surgery. Other: In image 76 of series 2, there is a 12 mm subcutaneous nodule in the right anterior abdominal wall. Small bilateral inguinal hernias containing fat are seen. Musculoskeletal: Unremarkable. IMPRESSION: There is 2.9 cm lobulated noncalcified pleural-based nodule in the right upper lobe suggesting possible primary malignant neoplasm. There are pathologically enlarged bulky lymph nodes in the mediastinum and both hilar regions, more so on the right side suggesting metastatic lymphadenopathy. There is extrinsic pressure over the right main pulmonary artery and superior vena cava by the enlarged lymph nodes in mediastinum. Increased interstitial markings and small scattered nodules are seen in the right  lung which may be part of pneumonia or pulmonary metastatic disease. There are nodular densities in both adrenals largest measuring 3 cm in the left adrenal suggesting possible metastatic disease. There are enlarged lymph nodes in the retroperitoneum and mesentery largest measuring 5.9 cm in maximum diameter suggesting metastatic lymphadenopathy. There are multiple cysts in the liver. There are other indeterminate low-density lesions of varying sizes in the liver. Possibility of hepatic metastatic disease is not excluded. Head of the pancreas is enlarged in size with lobulated margins. Possibility of neoplastic process in the head of the pancreas is not excluded. Follow-up PET-CT and biopsy as warranted should be considered. Coronary artery calcifications are seen. Other findings as described in the body of the report. Electronically Signed   By: Elmer Picker M.D.   On: 11/26/2021 13:49   CT BIOPSY  Result Date: 11/28/2021 INDICATION: 65 year old with evidence of diffuse metastatic disease. Tissue diagnosis is needed. EXAM: CT-GUIDED BIOPSY OF ANTERIOR ABDOMINAL PERITONEAL MASS  MEDICATIONS: Moderate sedation ANESTHESIA/SEDATION: Moderate (conscious) sedation was employed during this procedure. A total of Versed 2.0mg  and fentanyl 100 mcg was administered intravenously at the order of the provider performing the procedure. Total intra-service moderate sedation time: 14 minutes. Patient's level of consciousness and vital signs were monitored continuously by radiology nurse throughout the procedure under the supervision of the provider performing the procedure. FLUOROSCOPY TIME:  None COMPLICATIONS: None immediate. PROCEDURE: Informed written consent was obtained from the patient after a thorough discussion of the procedural risks, benefits and alternatives. All questions were addressed. A timeout was performed prior to the initiation of the procedure. Patient was placed supine on CT scanner. Images of the  upper abdomen are obtained. The large mass in the anterior upper abdomen was identified and targeted. The skin was prepped with chlorhexidine and sterile field was created. Skin and soft tissues were anesthetized with 1% lidocaine. A small incision was made. Using CT guidance, 17 gauge coaxial needle was directed into the anterior abdominal mass. Multiple core biopsies were obtained with an 18 gauge core device and specimens were placed in formalin. 17 gauge needle was removed without complication. Bandage placed over the puncture site. FINDINGS: Again noted is large mass in the anterior upper abdomen adjacent to the abdominal wall. This structure measures up to 5.1 cm. Needle position was confirmed within the lesion. Specimens placed in formalin. IMPRESSION: CT-guided core biopsies of the anterior upper abdominal mass. Electronically Signed   By: Markus Daft M.D.   On: 11/28/2021 12:50   DG CHEST PORT 1 VIEW  Result Date: 11/30/2021 CLINICAL DATA:  Cough, prostate cancer, COVID EXAM: PORTABLE CHEST 1 VIEW COMPARISON:  None. FINDINGS: Right Port-A-Cath tip at the cavoatrial junction. Heart is normal size. Left lung clear. Right hilar and mediastinal adenopathy again noted. Peripheral right upper lobe nodule/mass again noted. No effusions. No acute bony abnormality. IMPRESSION: Right upper lobe mass with right hilar and mediastinal adenopathy. Electronically Signed   By: Rolm Baptise M.D.   On: 11/30/2021 17:32   IR IMAGING GUIDED PORT INSERTION  Result Date: 11/28/2021 INDICATION: 65 year old with evidence of metastatic disease. Port-A-Cath needed for therapy. EXAM: FLUOROSCOPIC AND ULTRASOUND GUIDED PLACEMENT OF A SUBCUTANEOUS PORT COMPARISON:  None. MEDICATIONS: Moderate sedation ANESTHESIA/SEDATION: Versed 2.0 mg IV; Fentanyl 100 mcg IV; Moderate Sedation Time:  44 minutes The patient was continuously monitored during the procedure by the interventional radiology nurse under my direct supervision.  FLUOROSCOPY TIME:  24 seconds, 1 mGy COMPLICATIONS: None immediate. PROCEDURE: The procedure, risks, benefits, and alternatives were explained to the patient. Questions regarding the procedure were encouraged and answered. The patient understands and consents to the procedure. Patient was placed supine on the interventional table. Ultrasound confirmed a patent right internal jugular vein. Ultrasound image was saved for documentation. The right chest and neck were cleaned with a skin antiseptic and a sterile drape was placed. Maximal barrier sterile technique was utilized including caps, mask, sterile gowns, sterile gloves, sterile drape, hand hygiene and skin antiseptic. The right neck was anesthetized with 1% lidocaine. Small incision was made in the right neck with a blade. Micropuncture set was placed in the right internal jugular vein with ultrasound guidance. The micropuncture wire was used for measurement purposes. The right chest was anesthetized with 1% lidocaine with epinephrine. #15 blade was used to make an incision and a subcutaneous port pocket was formed. [place] was assembled. Subcutaneous tunnel was formed with a stiff tunneling device. The port catheter was brought  through the subcutaneous tunnel. The port was placed in the subcutaneous pocket. The micropuncture set was exchanged for a peel-away sheath. The catheter was placed through the peel-away sheath and the tip was positioned at the superior cavoatrial junction. Catheter placement was confirmed with fluoroscopy. The port was accessed and flushed with heparinized saline. The port pocket was closed using two layers of absorbable sutures and Dermabond. The vein skin site was closed using a single layer of absorbable suture and Dermabond. Sterile dressings were applied. Patient tolerated the procedure well without an immediate complication. Ultrasound and fluoroscopic images were taken and saved for this procedure. IMPRESSION:  Placement of a subcutaneous power-injectable port device. Catheter tip at the superior cavoatrial junction. Electronically Signed   By: Markus Daft M.D.   On: 11/28/2021 17:48       Discharge Exam: Vitals:   12/04/21 2050 12/05/21 0546  BP: (!) 138/100 (!) 148/83  Pulse: 82 91  Resp: 18 18  Temp: (!) 97.5 F (36.4 C) (!) 97.5 F (36.4 C)  SpO2: 95% 95%    General: Pt is alert, awake, not in acute distress Cardiovascular: RRR, S1/S2 +, no edema Respiratory: CTA bilaterally, no wheezing, no rhonchi, no respiratory distress, no conversational dyspnea  Abdominal: Soft, NT, ND, bowel sounds + Extremities: no edema, no cyanosis Psych: Normal mood and affect, stable judgement and insight     The results of significant diagnostics from this hospitalization (including imaging, microbiology, ancillary and laboratory) are listed below for reference.     Microbiology: Recent Results (from the past 240 hour(s))  Resp Panel by RT-PCR (Flu A&B, Covid) Nasopharyngeal Swab     Status: Abnormal   Collection Time: 11/26/21  4:04 PM   Specimen: Nasopharyngeal Swab; Nasopharyngeal(NP) swabs in vial transport medium  Result Value Ref Range Status   SARS Coronavirus 2 by RT PCR POSITIVE (A) NEGATIVE Final    Comment: RESULT CALLED TO, READ BACK BY AND VERIFIED WITH: JAMIE BAILEY RN @1646  11/26/2021 OLSONM (NOTE) SARS-CoV-2 target nucleic acids are DETECTED.  The SARS-CoV-2 RNA is generally detectable in upper respiratory specimens during the acute phase of infection. Positive results are indicative of the presence of the identified virus, but do not rule out bacterial infection or co-infection with other pathogens not detected by the test. Clinical correlation with patient history and other diagnostic information is necessary to determine patient infection status. The expected result is Negative.  Fact Sheet for Patients: EntrepreneurPulse.com.au  Fact Sheet for Healthcare  Providers: IncredibleEmployment.be  This test is not yet approved or cleared by the [place] FDA and  has been authorized for detection and/or diagnosis of SARS-CoV-2 by FDA under an Emergency Use Authorization (EUA).  This EUA will remain in effect (meaning this test  can be used) for the duration of  the COVID-19 declaration under Section 564(b)(1) of the Act, 21 U.S.C. section 360bbb-3(b)(1), unless the authorization is terminated or revoked sooner.     Influenza A by PCR NEGATIVE NEGATIVE Final   Influenza B by PCR NEGATIVE NEGATIVE Final    Comment: (NOTE) The Xpert Xpress SARS-CoV-2/FLU/RSV plus assay is intended as an aid in the diagnosis of influenza from Nasopharyngeal swab specimens and should not be used as a sole basis for treatment. Nasal washings and aspirates are unacceptable for Xpert Xpress SARS-CoV-2/FLU/RSV testing.  Fact Sheet for Patients: EntrepreneurPulse.com.au  Fact Sheet for Healthcare Providers: IncredibleEmployment.be  This test is not yet approved or cleared by the [place] and has been authorized for  detection and/or diagnosis of SARS-CoV-2 by FDA under an Emergency Use Authorization (EUA). This EUA will remain in effect (meaning this test can be used) for the duration of the COVID-19 declaration under Section 564(b)(1) of the Act, 21 U.S.C. section 360bbb-3(b)(1), unless the authorization is terminated or revoked.  Performed at [facility], [address] [place], [place] [address]   MRSA Next Gen by PCR, Nasal     Status: None   Collection Time: 11/26/21 11:44 PM   Specimen: Nasal Mucosa; Nasal Swab  Result Value Ref Range Status   MRSA by PCR Next Gen NOT DETECTED NOT DETECTED Final    Comment: (NOTE) The GeneXpert MRSA Assay (FDA approved for NASAL specimens only), is one component of a comprehensive MRSA colonization surveillance program. It is not intended  to diagnose MRSA infection nor to guide or monitor treatment for MRSA infections. Test performance is not FDA approved in patients less than 57 years old. Performed at [facility], [place] [address], [place], [place] [address]      Labs: BNP (last 3 results) No results for input(s): BNP in the last 8760 hours. Basic Metabolic Panel: Recent Labs  Lab 11/29/21 0325 11/30/21 0330 12/01/21 0314 12/01/21 0945 12/02/21 0556 12/02/21 1815 12/03/21 0036 12/03/21 0805 12/04/21 0321 12/05/21 0559  NA 124*   < > 121* 121* 121* 126* 129* 130* 130* 130*  K 4.2   < > 4.2 4.0 3.9  --  4.1  --  4.0 4.0  CL 91*   < > 90* 90* 89*  --  96*  --  98 99  CO2 21*   < > 19* 22 21*  --  22  --  23 24  GLUCOSE 140*   < > 155* 196* 167*  --  163*  --  170* 151*  BUN 19   < > 16 16 16   --  15  --  23 25*  CREATININE 0.53*   < > 0.45* 0.53* 0.47*  --  0.53*  --  0.48* 0.52*  CALCIUM 9.0   < > 9.2 8.8* 8.9  --  9.3  --  9.1 9.4  MG 2.0  --  1.9  --   --   --   --   --   --   --   PHOS 3.2  --  3.4  --   --   --   --   --   --   --    < > = values in this interval not displayed.   Liver Function Tests: Recent Labs  Lab 12/02/21 0556 12/03/21 0036  AST 28 29  ALT 31 39  ALKPHOS 65 68  BILITOT 0.9 0.8  PROT 7.2 6.9  ALBUMIN 3.6 3.5   No results for input(s): LIPASE, AMYLASE in the last 168 hours. No results for input(s): AMMONIA in the last 168 hours. CBC: Recent Labs  Lab 11/29/21 0325 12/02/21 0556 12/03/21 0036 12/05/21 0559  WBC 6.7 15.5* 12.6* 13.5*  HGB 12.2* 12.9* 13.0 12.0*  HCT 37.0* 38.0* 38.5* 35.6*  MCV 95.1 91.6 92.5 92.7  PLT 231 186 205 205   Cardiac Enzymes: No results for input(s): CKTOTAL, CKMB, CKMBINDEX, TROPONINI in the last 168 hours. BNP: Invalid input(s): POCBNP CBG: Recent Labs  Lab 12/04/21 0753 12/04/21 1128 12/04/21 1623 12/04/21 2054 12/05/21 0807  GLUCAP 242* 265* 174* 201* 176*   D-Dimer No results for input(s): DDIMER in  the last 72  hours. Hgb A1c No results for input(s): HGBA1C in the last 72 hours. Lipid Profile No results for input(s): CHOL, HDL, LDLCALC, TRIG, CHOLHDL, LDLDIRECT in the last 72 hours. Thyroid function studies No results for input(s): TSH, T4TOTAL, T3FREE, THYROIDAB in the last 72 hours.  Invalid input(s): FREET3 Anemia work up No results for input(s): VITAMINB12, FOLATE, FERRITIN, TIBC, IRON, RETICCTPCT in the last 72 hours. Urinalysis    Component Value Date/Time   COLORURINE YELLOW 11/26/2021 1030   APPEARANCEUR CLEAR 11/26/2021 1030   LABSPEC 1.020 11/26/2021 1030   PHURINE 6.0 11/26/2021 1030   GLUCOSEU NEGATIVE 11/26/2021 1030   HGBUR NEGATIVE 11/26/2021 1030   BILIRUBINUR NEGATIVE 11/26/2021 1030   KETONESUR NEGATIVE 11/26/2021 1030   PROTEINUR NEGATIVE 11/26/2021 1030   NITRITE NEGATIVE 11/26/2021 1030   LEUKOCYTESUR NEGATIVE 11/26/2021 1030   Sepsis Labs Invalid input(s): PROCALCITONIN,  WBC,  LACTICIDVEN Microbiology Recent Results (from the past 240 hour(s))  Resp Panel by RT-PCR (Flu A&B, Covid) Nasopharyngeal Swab     Status: Abnormal   Collection Time: 11/26/21  4:04 PM   Specimen: Nasopharyngeal Swab; Nasopharyngeal(NP) swabs in vial transport medium  Result Value Ref Range Status   SARS Coronavirus 2 by RT PCR POSITIVE (A) NEGATIVE Final    Comment: RESULT CALLED TO, READ BACK BY AND VERIFIED WITH: Raynelle Bring RN @1646  11/26/2021 OLSONM (NOTE) SARS-CoV-2 target nucleic acids are DETECTED.  The SARS-CoV-2 RNA is generally detectable in upper respiratory specimens during the acute phase of infection. Positive results are indicative of the presence of the identified virus, but do not rule out bacterial infection or co-infection with other pathogens not detected by the test. Clinical correlation with patient history and other diagnostic information is necessary to determine patient infection status. The expected result is Negative.  Fact Sheet for  Patients: EntrepreneurPulse.com.au  Fact Sheet for Healthcare Providers: IncredibleEmployment.be  This test is not yet approved or cleared by the [place] FDA and  has been authorized for detection and/or diagnosis of SARS-CoV-2 by FDA under an Emergency Use Authorization (EUA).  This EUA will remain in effect (meaning this test  can be used) for the duration of  the COVID-19 declaration under Section 564(b)(1) of the Act, 21 U.S.C. section 360bbb-3(b)(1), unless the authorization is terminated or revoked sooner.     Influenza A by PCR NEGATIVE NEGATIVE Final   Influenza B by PCR NEGATIVE NEGATIVE Final    Comment: (NOTE) The Xpert Xpress SARS-CoV-2/FLU/RSV plus assay is intended as an aid in the diagnosis of influenza from Nasopharyngeal swab specimens and should not be used as a sole basis for treatment. Nasal washings and aspirates are unacceptable for Xpert Xpress SARS-CoV-2/FLU/RSV testing.  Fact Sheet for Patients: EntrepreneurPulse.com.au  Fact Sheet for Healthcare Providers: IncredibleEmployment.be  This test is not yet approved or cleared by the [place] FDA and has been authorized for detection and/or diagnosis of SARS-CoV-2 by FDA under an Emergency Use Authorization (EUA). This EUA will remain in effect (meaning this test can be used) for the duration of the COVID-19 declaration under Section 564(b)(1) of the Act, 21 U.S.C. section 360bbb-3(b)(1), unless the authorization is terminated or revoked.  Performed at [facility], [address] [place], [place] [address]   MRSA Next Gen by PCR, Nasal     Status: None   Collection Time: 11/26/21 11:44 PM   Specimen: Nasal Mucosa; Nasal Swab  Result Value Ref Range Status   MRSA by PCR Next Gen NOT DETECTED NOT DETECTED  Final    Comment: (NOTE) The GeneXpert MRSA Assay (FDA approved for NASAL specimens only), is one  component of a comprehensive MRSA colonization surveillance program. It is not intended to diagnose MRSA infection nor to guide or monitor treatment for MRSA infections. Test performance is not FDA approved in patients less than 56 years old. Performed at [facility], [place] [address], [place], [place] [address]      Patient was seen and examined on the day of discharge and was found to be in stable condition. Time coordinating discharge: 40 minutes including assessment and coordination of care, as well as examination of the patient.   SIGNED:  Dessa Phi, DO [facility] 12/05/2021, 10:25 AM

## 2021-12-05 NOTE — TOC Transition Note (Signed)
Transition of Care [facility]) - CM/SW Discharge Note   Patient Details  Name: Ryan [name] MRN: [id number] Date of Birth: [birth year]  [facility] CM/SW Contact:  Lynnell Catalan, RN Phone Number: 12/05/2021, 10:29 AM   Clinical Narrative:    Physical Therapy recommendations gone over with wife. Wife politely declines [facility] services at this time.

## 2021-12-06 ENCOUNTER — Encounter: Payer: Self-pay | Admitting: Hematology and Oncology

## 2021-12-08 ENCOUNTER — Inpatient Hospital Stay: Payer: BC Managed Care – PPO

## 2021-12-08 ENCOUNTER — Ambulatory Visit
Admission: RE | Admit: 2021-12-08 | Discharge: 2021-12-08 | Disposition: A | Discharge status: Home / Self Care | Payer: BC Managed Care – PPO | Source: Ambulatory Visit | Attending: Radiation Oncology | Admitting: Radiation Oncology

## 2021-12-08 ENCOUNTER — Ambulatory Visit: Payer: BC Managed Care – PPO

## 2021-12-08 ENCOUNTER — Inpatient Hospital Stay: Payer: BC Managed Care – PPO | Admitting: Hematology and Oncology

## 2021-12-08 ENCOUNTER — Inpatient Hospital Stay: Payer: BC Managed Care – PPO | Attending: Hematology and Oncology

## 2021-12-08 DIAGNOSIS — C7970 Secondary malignant neoplasm of unspecified adrenal gland: Secondary | ICD-10-CM | POA: Diagnosis not present

## 2021-12-08 DIAGNOSIS — Z5189 Encounter for other specified aftercare: Secondary | ICD-10-CM | POA: Insufficient documentation

## 2021-12-08 DIAGNOSIS — K5909 Other constipation: Secondary | ICD-10-CM | POA: Diagnosis not present

## 2021-12-08 DIAGNOSIS — Z510 Encounter for antineoplastic radiation therapy: Secondary | ICD-10-CM | POA: Insufficient documentation

## 2021-12-08 DIAGNOSIS — C7931 Secondary malignant neoplasm of brain: Secondary | ICD-10-CM

## 2021-12-08 DIAGNOSIS — E222 Syndrome of inappropriate secretion of antidiuretic hormone: Secondary | ICD-10-CM

## 2021-12-08 DIAGNOSIS — C3491 Malignant neoplasm of unspecified part of right bronchus or lung: Secondary | ICD-10-CM | POA: Diagnosis present

## 2021-12-08 DIAGNOSIS — C787 Secondary malignant neoplasm of liver and intrahepatic bile duct: Secondary | ICD-10-CM | POA: Insufficient documentation

## 2021-12-08 LAB — CBC WITH DIFFERENTIAL/PLATELET
Abs Immature Granulocytes: 0.16 10*3/uL — ABNORMAL HIGH (ref 0.00–0.07)
Basophils Absolute: 0.1 10*3/uL (ref 0.0–0.1)
Basophils Relative: 1 %
Eosinophils Absolute: 0 10*3/uL (ref 0.0–0.5)
Eosinophils Relative: 0 %
HCT: 33.9 % — ABNORMAL LOW (ref 39.0–52.0)
Hemoglobin: 11.5 g/dL — ABNORMAL LOW (ref 13.0–17.0)
Immature Granulocytes: 2 %
Lymphocytes Relative: 2 %
Lymphs Abs: 0.2 10*3/uL — ABNORMAL LOW (ref 0.7–4.0)
MCH: 31 pg (ref 26.0–34.0)
MCHC: 33.9 g/dL (ref 30.0–36.0)
MCV: 91.4 fL (ref 80.0–100.0)
Monocytes Absolute: 0 10*3/uL — ABNORMAL LOW (ref 0.1–1.0)
Monocytes Relative: 0 %
Neutro Abs: 9.1 10*3/uL — ABNORMAL HIGH (ref 1.7–7.7)
Neutrophils Relative %: 95 %
Platelets: 129 10*3/uL — ABNORMAL LOW (ref 150–400)
RBC: 3.71 MIL/uL — ABNORMAL LOW (ref 4.22–5.81)
RDW: 13 % (ref 11.5–15.5)
WBC: 9.5 10*3/uL (ref 4.0–10.5)
nRBC: 0 % (ref 0.0–0.2)

## 2021-12-08 LAB — COMPREHENSIVE METABOLIC PANEL
ALT: 36 U/L (ref 0–44)
AST: 16 U/L (ref 15–41)
Albumin: 3.1 g/dL — ABNORMAL LOW (ref 3.5–5.0)
Alkaline Phosphatase: 85 U/L (ref 38–126)
Anion gap: 9 (ref 5–15)
BUN: 19 mg/dL (ref 8–23)
CO2: 24 mmol/L (ref 22–32)
Calcium: 8.5 mg/dL — ABNORMAL LOW (ref 8.9–10.3)
Chloride: 99 mmol/L (ref 98–111)
Creatinine, Ser: 0.55 mg/dL — ABNORMAL LOW (ref 0.61–1.24)
GFR, Estimated: >60 mL/min (ref >60)
Glucose, Bld: 99 mg/dL (ref 70–99)
Potassium: 4.3 mmol/L (ref 3.5–5.1)
Sodium: 132 mmol/L — ABNORMAL LOW (ref 135–145)
Total Bilirubin: 0.8 mg/dL (ref 0.3–1.2)
Total Protein: 6.2 g/dL — ABNORMAL LOW (ref 6.5–8.1)

## 2021-12-08 LAB — URIC ACID: Uric Acid, Serum: 2.1 mg/dL — ABNORMAL LOW (ref 3.7–8.6)

## 2021-12-08 MED ORDER — LIDOCAINE-PRILOCAINE 2.5-2.5 % EX CREA: 1.0000 "application " | TOPICAL_CREAM | CUTANEOUS | 3 refills | Status: AC | PRN

## 2021-12-08 MED ORDER — SODIUM CHLORIDE 0.9% FLUSH
10.0000 mL | Freq: Once | INTRAVENOUS | Status: AC
  Administered 2021-12-08: 14:00:00 10 mL

## 2021-12-08 MED ORDER — PROCHLORPERAZINE MALEATE 10 MG PO TABS: 10.0000 mg | ORAL_TABLET | Freq: Four times a day (QID) | ORAL | 1 refills | Status: DC | PRN

## 2021-12-08 MED ORDER — PEGFILGRASTIM-CBQV 6 MG/0.6ML ~~LOC~~ SOSY
6.0000 mg | PREFILLED_SYRINGE | Freq: Once | SUBCUTANEOUS | Status: AC
  Administered 2021-12-08: 14:00:00 6 mg via SUBCUTANEOUS
  Filled 2021-12-08: qty 0.6

## 2021-12-08 MED ORDER — HEPARIN SOD (PORK) LOCK FLUSH 100 UNIT/ML IV SOLN
500.0000 [IU] | Freq: Once | INTRAVENOUS | Status: AC
  Administered 2021-12-08: 14:00:00 500 [IU]

## 2021-12-08 MED ORDER — ONDANSETRON HCL 8 MG PO TABS: 8.0000 mg | ORAL_TABLET | Freq: Three times a day (TID) | ORAL | 1 refills | Status: AC | PRN

## 2021-12-08 NOTE — Patient Instructions (Signed)

## 2021-12-08 NOTE — Progress Notes (Signed)
Received fax from [place] with [facility], will forward fax to the person who fill out the paper work.

## 2021-12-09 ENCOUNTER — Ambulatory Visit
Admission: RE | Admit: 2021-12-09 | Discharge: 2021-12-09 | Disposition: A | Discharge status: Home / Self Care | Payer: BC Managed Care – PPO | Source: Ambulatory Visit | Attending: Radiation Oncology | Admitting: Radiation Oncology

## 2021-12-09 ENCOUNTER — Other Ambulatory Visit: Payer: Self-pay

## 2021-12-09 ENCOUNTER — Encounter: Payer: Self-pay | Admitting: Hematology and Oncology

## 2021-12-09 DIAGNOSIS — Z510 Encounter for antineoplastic radiation therapy: Secondary | ICD-10-CM | POA: Diagnosis not present

## 2021-12-09 DIAGNOSIS — E222 Syndrome of inappropriate secretion of antidiuretic hormone: Secondary | ICD-10-CM | POA: Insufficient documentation

## 2021-12-09 MED ORDER — DEXAMETHASONE 4 MG PO TABS: 2.0000 mg | ORAL_TABLET | Freq: Two times a day (BID) | ORAL | 1 refills | Status: DC

## 2021-12-09 NOTE — Progress Notes (Signed)
[place] OFFICE PROGRESS NOTE  Patient Care Team: [name], DO as PCP - General (Internal Medicine) Valrie Hart, RN as Oncology Nurse Navigator (Oncology)  ASSESSMENT & PLAN:  Metastatic primary lung cancer, right [facility]) The patient was diagnosed with widespread metastatic small cell lung cancer He was started on palliative chemotherapy and radiation and overall improving I recommend gentle medication taper I will see him again next week for further follow-up and supportive care  SIADH (syndrome of inappropriate ADH production) ([place]) This is due to his malignancy Overall, his sodium is improving I recommend reducing salt tablets and continue to monitor closely  Metastatic cancer to brain [facility]) He is doing well with radiation treatment I recommend gentle dexamethasone taper  Other constipation He had constipation while hospitalized but now improving I recommend discontinuation of lactulose  No orders of the defined types were placed in this encounter.   All questions were answered. The patient knows to call the clinic with any problems, questions or concerns. The total time spent in the appointment was 40 minutes encounter with patients including review of chart and various tests results, discussions about plan of care and coordination of care plan   [name], MD 12/09/2021 9:54 AM  INTERVAL HISTORY: Please see below for problem oriented charting. he returns for treatment follow-up with his significant other after recent hospital discharge He is doing well His hoarseness is improving He complained of leg swelling He is no longer constipated but now has loose stool His mental status is improved His appetite is good  REVIEW OF SYSTEMS:   Constitutional: Denies fevers, chills or abnormal weight loss Eyes: Denies blurriness of vision Ears, nose, mouth, throat, and face: Denies mucositis or sore throat Respiratory: Denies cough, dyspnea or  wheezes Cardiovascular: Denies palpitation, chest discomfort  Gastrointestinal:  Denies nausea, heartburn or change in bowel habits Skin: Denies abnormal skin rashes Lymphatics: Denies new lymphadenopathy or easy bruising Neurological:Denies numbness, tingling or new weaknesses Behavioral/Psych: Mood is stable, no new changes  All other systems were reviewed with the patient and are negative.  I have reviewed the past medical history, past surgical history, social history and family history with the patient and they are unchanged from previous note.  ALLERGIES:  is allergic to hydralazine and percocet [oxycodone-acetaminophen].  MEDICATIONS:  Current Outpatient Medications  Medication Sig Dispense Refill   lidocaine-prilocaine (EMLA) cream Apply 1 application topically as needed. 30 g 3   ondansetron (ZOFRAN) 8 MG tablet Take 1 tablet (8 mg total) by mouth every 8 (eight) hours as needed for nausea or vomiting. 30 tablet 1   prochlorperazine (COMPAZINE) 10 MG tablet Take 1 tablet (10 mg total) by mouth every 6 (six) hours as needed for nausea or vomiting. 30 tablet 1   amLODipine (NORVASC) 10 MG tablet Take 1 tablet (10 mg total) by mouth daily. 30 tablet 2   atorvastatin (LIPITOR) 20 MG tablet Take 20 mg by mouth daily.     dexamethasone (DECADRON) 4 MG tablet Take 0.5 tablets (2 mg total) by mouth 2 (two) times daily. 60 tablet 1   docusate sodium (COLACE) 100 MG capsule Take 1 capsule (100 mg total) by mouth 2 (two) times daily. 60 capsule 1   Elastic Bandages & Supports (ABDOMINAL BINDER/ELASTIC 2XL) MISC 1 Units by Does not apply route daily. 1 each 0   furosemide (LASIX) 20 MG tablet Take 0.5 tablets (10 mg total) by mouth daily. 30 tablet 1   memantine (NAMENDA) 10  MG tablet Take 1 tablet (10 mg total) by mouth 2 (two) times daily. 60 tablet 4   memantine (NAMENDA) 5 MG tablet Begin this prescription the first day of brain radiation. Week 1: take one tablet po qam. Week 2: take one  tablet qam and qpm. Week 3: take two tablets qam, and one tablet po q pm. Week 4: take two tablets qam and qpm. Fill subsequent prescription q month. 70 tablet 0   metFORMIN (GLUCOPHAGE) 1000 MG tablet Take 500-1,000 mg by mouth 2 (two) times daily with a meal.     pantoprazole (PROTONIX) 40 MG tablet Take 1 tablet (40 mg total) by mouth daily. 30 tablet 2   polyethylene glycol (MIRALAX / GLYCOLAX) 17 g packet Take 17 g by mouth daily. 14 each 0   sodium chloride 1 g tablet Take 2 tablets (2 g total) by mouth 3 (three) times daily with meals. 180 tablet 1   zolpidem (AMBIEN) 10 MG tablet Take 10 mg by mouth at bedtime as needed for sleep.     No current facility-administered medications for this visit.    SUMMARY OF ONCOLOGIC HISTORY: Oncology History  Metastatic cancer to brain ([place])  11/27/2021 Initial Diagnosis   Metastatic cancer to brain [facility])   12/03/2021 -  Chemotherapy   Patient is on Treatment Plan : LUNG SMALL CELL Carboplatin D1 / Etoposide D1-3 q21d     Metastatic primary lung cancer, right ([place])  11/26/2021 Imaging   There is 2.9 cm lobulated noncalcified pleural-based nodule in the right upper lobe suggesting possible primary malignant neoplasm. There are pathologically enlarged bulky lymph nodes in the mediastinum and both hilar regions, more so on the right side suggesting metastatic lymphadenopathy. There is extrinsic pressure over the right main pulmonary artery and superior vena cava by the enlarged lymph nodes in mediastinum.   Increased interstitial markings and small scattered nodules are seen in the right lung which may be part of pneumonia or pulmonary metastatic disease.   There are nodular densities in both adrenals largest measuring 3 cm in the left adrenal suggesting possible metastatic disease. There are enlarged lymph nodes in the retroperitoneum and mesentery largest measuring 5.9 cm in maximum diameter suggesting metastatic lymphadenopathy.   There are  multiple cysts in the liver. There are other indeterminate low-density lesions of varying sizes in the liver. Possibility of hepatic metastatic disease is not excluded.   Head of the pancreas is enlarged in size with lobulated margins. Possibility of neoplastic process in the head of the pancreas is not excluded. Follow-up PET-CT and biopsy as warranted should be considered.   Coronary artery calcifications are seen. Other findings as described in the body of the report.   11/26/2021 - 12/05/2021 Hospital Admission   He was admitted to the hospital and found to have metastatic lung cancer   11/27/2021 Initial Diagnosis   Metastatic primary lung cancer, right ([place])   11/27/2021 Imaging   MRI HEAD IMPRESSION:   1. 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 Mild localized edema with hemorrhagic blood products about several of these lesions without significant regional mass effect or midline shift.  2. 3.2 x 6.6 x 4.8 cm enhancing extra-axial mass at the posterior aspect of the superior sagittal sinus, indeterminate. While this finding could reflect an additional dural-based metastasis, a possible concomitant meningioma could also be considered.   MRV HEAD IMPRESSION:   1. Invasion and obliteration of the posterior superior sagittal sinus by the above  described dural based mass. Small amount of additional nonocclusive thrombus within the superior sagittal sinus anterior to the mass as above. 2. Remainder of the major dural sinuses are otherwise patent.     11/28/2021 Pathology Results   A. PERITONEAL MASS, UPPER ABDOMINAL MIDLINE, BIOPSY:  High-grade neuroendocrine carcinoma, small cell type (Small cell  carcinoma).  Please see comment.   The following immunostains are performed on block A2 with appropriate  controls:  CK7: Negative.  TTF-1: Positive.  Chromogranin: Negative.  Synoptophysin: Negative.  CD56: Positive.   Ki-67: Very high proliferative index (more than 90%).   Comment: Morphologic features of the neoplasm are most consistent with metastasis from a primary small cell carcinoma in the lung.    12/03/2021 -  Chemotherapy   Patient is on Treatment Plan : LUNG SMALL CELL Carboplatin D1 / Etoposide D1-3 q21d     12/03/2021 -  Chemotherapy   Patient is on Treatment Plan :  LUNG SMALL CELL Carboplatin D1 / Etoposide D1-3 q21d       PHYSICAL EXAMINATION: ECOG PERFORMANCE STATUS: 1 - Symptomatic but completely ambulatory  Vitals:   12/08/21 1508  BP: (!) 149/81  Pulse: 95  Resp: 18  Temp: 99.3 F (37.4 C)  SpO2: 94%   Filed Weights   12/08/21 1508  Weight: 218 lb 3.2 oz (99 kg)    GENERAL:alert, no distress and comfortable.  His hoarseness is improved SKIN: He has significant bruises from recent hospitalization EYES: normal, Conjunctiva are pink and non-injected, sclera clear OROPHARYNX:no exudate, no erythema and lips, buccal mucosa, and tongue normal  NECK: supple, thyroid normal size, non-tender, without nodularity LYMPH:  no palpable lymphadenopathy in the cervical, axillary or inguinal LUNGS: clear to auscultation and percussion with normal breathing effort HEART: regular rate & rhythm and no murmurs and no lower extremity edema ABDOMEN:abdomen soft, non-tender and normal bowel sounds Musculoskeletal:no cyanosis of digits and no clubbing  NEURO: alert & oriented x 3 with fluent speech, no focal motor/sensory deficits  LABORATORY DATA:  I have reviewed the data as listed    Component Value Date/Time   NA 132 (L) 12/08/2021 1406   K 4.3 12/08/2021 1406   CL 99 12/08/2021 1406   CO2 24 12/08/2021 1406   GLUCOSE 99 12/08/2021 1406   BUN 19 12/08/2021 1406   CREATININE 0.55 (L) 12/08/2021 1406   CALCIUM 8.5 (L) 12/08/2021 1406   PROT 6.2 (L) 12/08/2021 1406   ALBUMIN 3.1 (L) 12/08/2021 1406   AST 16 12/08/2021 1406   ALT 36 12/08/2021 1406   ALKPHOS 85 12/08/2021 1406    BILITOT 0.8 12/08/2021 1406   GFRNONAA >60 12/08/2021 1406   GFRAA >60 01/01/2019 0726    No results found for: SPEP, UPEP  Lab Results  Component Value Date   WBC 9.5 12/08/2021   NEUTROABS 9.1 (H) 12/08/2021   HGB 11.5 (L) 12/08/2021   HCT 33.9 (L) 12/08/2021   MCV 91.4 12/08/2021   PLT 129 (L) 12/08/2021      Chemistry      Component Value Date/Time   NA 132 (L) 12/08/2021 1406   K 4.3 12/08/2021 1406   CL 99 12/08/2021 1406   CO2 24 12/08/2021 1406   BUN 19 12/08/2021 1406   CREATININE 0.55 (L) 12/08/2021 1406      Component Value Date/Time   CALCIUM 8.5 (L) 12/08/2021 1406   ALKPHOS 85 12/08/2021 1406   AST 16 12/08/2021 1406   ALT 36 12/08/2021 1406   BILITOT  0.8 12/08/2021 1406       RADIOGRAPHIC STUDIES: I have personally reviewed the radiological images as listed and agreed with the findings in the report. CT Head Wo Contrast  Result Date: 11/26/2021 CLINICAL DATA:  Dizziness and disequilibrium. Symptoms for 1 month. COVID 3 weeks ago. Prostate cancer with new lung mass. EXAM: CT HEAD WITHOUT CONTRAST TECHNIQUE: Contiguous axial images were obtained from the base of the skull through the vertex without intravenous contrast. COMPARISON:  None. FINDINGS: Brain: A midline extra-axial hyperdense mass lesion surrounds the superior sagittal sinus. The lesion measures 5.9 x 3.5 x 4.4 cm. An area of hypoattenuation is present in the posterior right frontal lobe adjacent to the lesion. There is mass effect on the posterior frontal and parietal lobes bilaterally. Separate hyperdense focus is present in the cortical or subcortical left parietal lobe on axial image 24 of series 5. Similar subcortical hyperdensity is present in the left occipital lobe on axial image 17 of series 5. Areas of hypoattenuation are present in the cerebellum. The largest is in midline with some surrounding hyperdensity, also concerning for a metastatic lesion. It measures 2.2 x 2.1 cm on saddle  sagittal image 29 of series 4. Smaller hypodense left cystic lesions the cerebellum are also concerning metastases. Infarct is considered less likely. A hypodense lesion in the right temporal lobe measures 11 mm. The ventricles are of normal size.  No significant fluid is present Vascular: No hyperdense vessel or unexpected calcification. Skull: Calvarium is intact. No focal lytic or blastic lesions are present. No significant extracranial soft tissue lesion is present. Sinuses/Orbits: Right maxillary sinus opacification is likely chronic. There is some wall thickening. Minimal air is present in the sinus. The paranasal sinuses and mastoid air cells are otherwise clear. The globes and orbits are within normal limits. IMPRESSION: 1. 5.9 x 3.5 x 4.4 cm midline extra-axial hyperdense mass lesion surrounds the superior sagittal sinus. This most likely represents a metastasis. Meningioma is considered less likely. 2. Other hyperdense lesions are present in the cortical or subcortical left parietal lobe, left occipital lobe, and right temporal lobe concerning for metastatic disease. 3. There is mass effect on the posterior frontal and parietal lobes bilaterally. 4. Recommend MRI the brain without and contrast for further evaluation of these lesions. 5. Chronic right maxillary sinus disease. These results were called by telephone at the time of interpretation on 11/26/2021 at 1:30 pm to provider Dr. Melina Copa, who verbally acknowledged these results. Electronically Signed   By: San Morelle M.D.   On: 11/26/2021 13:46   MR Brain W and Wo Contrast  Result Date: 11/28/2021 CLINICAL DATA:  Initial evaluation for brain mass. EXAM: MRI HEAD WITHOUT AND WITH CONTRAST MRV HEAD WITHOUT CONTRAST TECHNIQUE: Multiplanar, multiecho pulse sequences of the brain and surrounding structures were obtained without and with intravenous contrast. Angiographic images of the intracranial venous structures were obtained using MRV  technique without intravenous contrast. CONTRAST:  71m GADAVIST GADOBUTROL 1 MMOL/ML IV SOLN COMPARISON:  Prior CT from 11/26/2021. FINDINGS: MRI HEAD FINDINGS: Brain: Cerebral volume within normal limits. Multiple scattered intraparenchymal lesions are seen involving both cerebral hemispheres as well as the cerebellum, consistent with widespread metastatic disease. There are at least 20 lesions. For reference purposes, the largest lesion within the right cerebral hemisphere is present at the right temporal lobe in measures 1.5 x 1.2 x 0.7 cm (series 26, image 22). The largest lesion within the left cerebral hemisphere is positioned at the left occipital pole and measures  1.5 x 1.7 x 1.7 cm (series 26, image 21). Largest distinct infratentorial lesion is seen within the central cerebellum and measures 2.1 x 2.2 x 2.5 cm (series 26, image 12). Several of these lesions are somewhat cystic in appearance. Few lesions demonstrate intrinsic T1 hyperintensity and/or SWI signal, consistent with hemorrhage and/or blood products, most notable at the central cerebellum (series 11, image 11). Mild localized edema about several of these lesions without significant regional mass effect. An additional large enhancing extra-axial mass positioned at the posterior aspect of the superior sagittal sinus measures 3.2 x 6.6 x 4.8 cm (series 28, image 13). Scattered areas of internal susceptibility artifact consistent with blood products. Mild localized vasogenic edema without significant mass effect. This lesion is somewhat indeterminate, and could reflect an additional dural-based metastasis. Concomitant meningioma could also be considered. This lesion invades and obliterates the adjacent superior sagittal sinus, better evaluated on concomitant MRV. Mild diffuse smooth pachymeningeal thickening and enhancement is likely secondary. No definite evidence for acute or subacute ischemia. Note made of a punctate focus of diffusion  abnormality at the right basal ganglia, favored to reflect a small metastasis rather than infarct (series 5, image 75). No midline shift or hydrocephalus. No other extra-axial fluid collection. Pituitary gland suprasellar region within normal limits. Vascular: Major intracranial arterial vascular flow voids are maintained. Skull and upper cervical spine: Craniocervical junction within normal limits. Heterogeneous signal intensity seen within the visualized bone marrow without focal marrow replacing lesion. No scalp soft tissue abnormality. Sinuses/Orbits: Globes within normal limits. Increased CSF signal intensity seen along the optic nerve sheaths bilaterally, suggesting elevated intracranial pressures. Orbital soft tissues otherwise unremarkable. Moderate mucosal thickening within the right maxillary sinus. Paranasal sinuses are otherwise largely clear. Small right with trace left mastoid effusions. Other: None. MRV HEAD FINDINGS: The above described dural based mass again noted at the posterior aspect of the superior sagittal sinus. The mass invades and obliterates the superior sagittal sinus at this level (series 1, image 98). Tubular filling defect is seen within the superior sagittal sinus anterior to the mass, likely a small amount of nonocclusive thrombus (series 13, images 88, 102). Superior sagittal sinus otherwise patent anteriorly. Additional extension of the lesion to involve the posterior aspect of the suture superior sagittal sinus as it courses towards the torcula (series 12, image 82) the torcula itself is patent. Transverse and sigmoid sinuses are patent as are the visualized proximal internal jugular veins. Straight sinus, vein of Galen, inferior sagittal sinus, internal cerebral veins, and basal veins of Rosenthal are patent. No abnormality about the cavernous sinus. Superior orbital veins symmetric and within normal limits. No appreciable cortical venous thrombosis. IMPRESSION: MRI HEAD  IMPRESSION: 1. 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 Mild localized edema with hemorrhagic blood products about several of these lesions without significant regional mass effect or midline shift. 2. 3.2 x 6.6 x 4.8 cm enhancing extra-axial mass at the posterior aspect of the superior sagittal sinus, indeterminate. While this finding could reflect an additional dural-based metastasis, a possible concomitant meningioma could also be considered. MRV HEAD IMPRESSION: 1. Invasion and obliteration of the posterior superior sagittal sinus by the above described dural based mass. Small amount of additional nonocclusive thrombus within the superior sagittal sinus anterior to the mass as above. 2. Remainder of the major dural sinuses are otherwise patent. Electronically Signed   By: Jeannine Boga M.D.   On: 11/28/2021 00:09   MR Venogram Head  Result Date:  11/28/2021 CLINICAL DATA:  Initial evaluation for brain mass. EXAM: MRI HEAD WITHOUT AND WITH CONTRAST MRV HEAD WITHOUT CONTRAST TECHNIQUE: Multiplanar, multiecho pulse sequences of the brain and surrounding structures were obtained without and with intravenous contrast. Angiographic images of the intracranial venous structures were obtained using MRV technique without intravenous contrast. CONTRAST:  31m GADAVIST GADOBUTROL 1 MMOL/ML IV SOLN COMPARISON:  Prior CT from 11/26/2021. FINDINGS: MRI HEAD FINDINGS: Brain: Cerebral volume within normal limits. Multiple scattered intraparenchymal lesions are seen involving both cerebral hemispheres as well as the cerebellum, consistent with widespread metastatic disease. There are at least 20 lesions. For reference purposes, the largest lesion within the right cerebral hemisphere is present at the right temporal lobe in measures 1.5 x 1.2 x 0.7 cm (series 26, image 22). The largest lesion within the left cerebral hemisphere is positioned at the left  occipital pole and measures 1.5 x 1.7 x 1.7 cm (series 26, image 21). Largest distinct infratentorial lesion is seen within the central cerebellum and measures 2.1 x 2.2 x 2.5 cm (series 26, image 12). Several of these lesions are somewhat cystic in appearance. Few lesions demonstrate intrinsic T1 hyperintensity and/or SWI signal, consistent with hemorrhage and/or blood products, most notable at the central cerebellum (series 11, image 11). Mild localized edema about several of these lesions without significant regional mass effect. An additional large enhancing extra-axial mass positioned at the posterior aspect of the superior sagittal sinus measures 3.2 x 6.6 x 4.8 cm (series 28, image 13). Scattered areas of internal susceptibility artifact consistent with blood products. Mild localized vasogenic edema without significant mass effect. This lesion is somewhat indeterminate, and could reflect an additional dural-based metastasis. Concomitant meningioma could also be considered. This lesion invades and obliterates the adjacent superior sagittal sinus, better evaluated on concomitant MRV. Mild diffuse smooth pachymeningeal thickening and enhancement is likely secondary. No definite evidence for acute or subacute ischemia. Note made of a punctate focus of diffusion abnormality at the right basal ganglia, favored to reflect a small metastasis rather than infarct (series 5, image 75). No midline shift or hydrocephalus. No other extra-axial fluid collection. Pituitary gland suprasellar region within normal limits. Vascular: Major intracranial arterial vascular flow voids are maintained. Skull and upper cervical spine: Craniocervical junction within normal limits. Heterogeneous signal intensity seen within the visualized bone marrow without focal marrow replacing lesion. No scalp soft tissue abnormality. Sinuses/Orbits: Globes within normal limits. Increased CSF signal intensity seen along the optic nerve sheaths  bilaterally, suggesting elevated intracranial pressures. Orbital soft tissues otherwise unremarkable. Moderate mucosal thickening within the right maxillary sinus. Paranasal sinuses are otherwise largely clear. Small right with trace left mastoid effusions. Other: None. MRV HEAD FINDINGS: The above described dural based mass again noted at the posterior aspect of the superior sagittal sinus. The mass invades and obliterates the superior sagittal sinus at this level (series 1, image 98). Tubular filling defect is seen within the superior sagittal sinus anterior to the mass, likely a small amount of nonocclusive thrombus (series 13, images 88, 102). Superior sagittal sinus otherwise patent anteriorly. Additional extension of the lesion to involve the posterior aspect of the suture superior sagittal sinus as it courses towards the torcula (series 12, image 82) the torcula itself is patent. Transverse and sigmoid sinuses are patent as are the visualized proximal internal jugular veins. Straight sinus, vein of Galen, inferior sagittal sinus, internal cerebral veins, and basal veins of Rosenthal are patent. No abnormality about the cavernous sinus. Superior orbital veins symmetric and  within normal limits. No appreciable cortical venous thrombosis. IMPRESSION: MRI HEAD IMPRESSION: 1. 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 Mild localized edema with hemorrhagic blood products about several of these lesions without significant regional mass effect or midline shift. 2. 3.2 x 6.6 x 4.8 cm enhancing extra-axial mass at the posterior aspect of the superior sagittal sinus, indeterminate. While this finding could reflect an additional dural-based metastasis, a possible concomitant meningioma could also be considered. MRV HEAD IMPRESSION: 1. Invasion and obliteration of the posterior superior sagittal sinus by the above described dural based mass. Small amount of  additional nonocclusive thrombus within the superior sagittal sinus anterior to the mass as above. 2. Remainder of the major dural sinuses are otherwise patent. Electronically Signed   By: Jeannine Boga M.D.   On: 11/28/2021 00:09   CT CHEST ABDOMEN PELVIS W CONTRAST  Result Date: 11/26/2021 CLINICAL DATA:  Persistent cough, shortness of breath EXAM: CT CHEST, ABDOMEN, AND PELVIS WITH CONTRAST TECHNIQUE: Multidetector CT imaging of the chest, abdomen and pelvis was performed following the standard protocol during bolus administration of intravenous contrast. CONTRAST:  134m OMNIPAQUE IOHEXOL 300 MG/ML  SOLN COMPARISON:  Previous studies including chest radiographs done on 11/24/2021 FINDINGS: CT CHEST FINDINGS Cardiovascular: Coronary artery calcifications are seen. There is homogeneous enhancement in thoracic aorta. There are no intraluminal filling defects in central pulmonary artery branches. There is extrinsic compression of right main pulmonary artery caused by pathologically enlarged lymph nodes in mediastinum and right hilum. There is extrinsic pressure over the superior vena cava caused by lymphadenopathy. Mediastinum/Nodes: There is bulky lymphadenopathy in the mediastinum extending to the right hilum measuring approximately 8.6 x 8.6 x 9.5 cm. There is 2.6 x 1.9 cm node in the left hilum. Lungs/Pleura: Centrilobular emphysema is seen. In image 54 of series 4, there is 2.9 x 2.4 cm pleural-based noncalcified nodule in the right upper lobe. There are increased interstitial markings in the right upper lobe. In the image 71, there is 12 mm nodular density in the right upper lobe. In the image 73, there is 6 mm nodule in the right upper lobe. There are 3 small nodular densities each measuring less than 9 mm in size in the right lower lobe. Musculoskeletal: Unremarkable. CT ABDOMEN PELVIS FINDINGS Hepatobiliary: Liver measures 17.5 cm in length. In the image 58 of series 2, there is 1.6 cm fluid  density lesion in the left lobe, possibly cyst. There are other scattered low-density foci in the right lobe some of which may be cysts. Possibility of space-occupying neoplastic process is not excluded. There are slightly enlarged lymph nodes anterior to the liver and adjacent to the pericardium largest measuring 2.7 x 1.7 cm. There is no dilation of bile ducts. Gallbladder is not distended. Pancreas: Head of the pancreas is prominent in size. There is no dilation of pancreatic duct. No focal abnormality is seen in the body and tail of pancreas. Spleen: Spleen is not enlarged. Adrenals/Urinary Tract: There is 3 cm nodule in the left adrenal. There is 2 cm nodule in the right adrenal. There is no hydronephrosis. There are no renal or ureteral stones. There are smooth marginated nodular densities adjacent to the kidneys largest measuring 3.6 cm posterior to the upper pole of right kidney. Density measurements in this lesions are higher than usual for simple cysts. Ureters not dilated. Urinary bladder is not distended. Stomach/Bowel: Stomach is unremarkable. Small bowel loops are not dilated. Appendix is not dilated. There is  no significant wall thickening in colon. Vascular/Lymphatic: There are scattered atherosclerotic plaques and calcifications in the aorta and its major branches. There are pathologically enlarged lymph nodes in the mesentery and retroperitoneum. Largest of these nodes is noted anterior to the transverse colon measuring 5.9 x 3 cm. There are multiple other smaller nodules of varying sizes in the abdomen in the mesentery and retroperitoneum. Reproductive: Prostate is not enlarged. There is fluid density in the the region of prostatic urethra. This may suggest previous prostate surgery. Other: In image 76 of series 2, there is a 12 mm subcutaneous nodule in the right anterior abdominal wall. Small bilateral inguinal hernias containing fat are seen. Musculoskeletal: Unremarkable. IMPRESSION: There is  2.9 cm lobulated noncalcified pleural-based nodule in the right upper lobe suggesting possible primary malignant neoplasm. There are pathologically enlarged bulky lymph nodes in the mediastinum and both hilar regions, more so on the right side suggesting metastatic lymphadenopathy. There is extrinsic pressure over the right main pulmonary artery and superior vena cava by the enlarged lymph nodes in mediastinum. Increased interstitial markings and small scattered nodules are seen in the right lung which may be part of pneumonia or pulmonary metastatic disease. There are nodular densities in both adrenals largest measuring 3 cm in the left adrenal suggesting possible metastatic disease. There are enlarged lymph nodes in the retroperitoneum and mesentery largest measuring 5.9 cm in maximum diameter suggesting metastatic lymphadenopathy. There are multiple cysts in the liver. There are other indeterminate low-density lesions of varying sizes in the liver. Possibility of hepatic metastatic disease is not excluded. Head of the pancreas is enlarged in size with lobulated margins. Possibility of neoplastic process in the head of the pancreas is not excluded. Follow-up PET-CT and biopsy as warranted should be considered. Coronary artery calcifications are seen. Other findings as described in the body of the report. Electronically Signed   By: Elmer Picker M.D.   On: 11/26/2021 13:49   CT BIOPSY  Result Date: 11/28/2021 INDICATION: 65 year old with evidence of diffuse metastatic disease. Tissue diagnosis is needed. EXAM: CT-GUIDED BIOPSY OF ANTERIOR ABDOMINAL PERITONEAL MASS MEDICATIONS: Moderate sedation ANESTHESIA/SEDATION: Moderate (conscious) sedation was employed during this procedure. A total of Versed 2.40m and fentanyl 100 mcg was administered intravenously at the order of the provider performing the procedure. Total intra-service moderate sedation time: 14 minutes. Patient's level of consciousness and  vital signs were monitored continuously by radiology nurse throughout the procedure under the supervision of the provider performing the procedure. FLUOROSCOPY TIME:  None COMPLICATIONS: None immediate. PROCEDURE: Informed written consent was obtained from the patient after a thorough discussion of the procedural risks, benefits and alternatives. All questions were addressed. A timeout was performed prior to the initiation of the procedure. Patient was placed supine on CT scanner. Images of the upper abdomen are obtained. The large mass in the anterior upper abdomen was identified and targeted. The skin was prepped with chlorhexidine and sterile field was created. Skin and soft tissues were anesthetized with 1% lidocaine. A small incision was made. Using CT guidance, 17 gauge coaxial needle was directed into the anterior abdominal mass. Multiple core biopsies were obtained with an 18 gauge core device and specimens were placed in formalin. 17 gauge needle was removed without complication. Bandage placed over the puncture site. FINDINGS: Again noted is large mass in the anterior upper abdomen adjacent to the abdominal wall. This structure measures up to 5.1 cm. Needle position was confirmed within the lesion. Specimens placed in formalin. IMPRESSION: CT-guided core  biopsies of the anterior upper abdominal mass. Electronically Signed   By: Markus Daft M.D.   On: 11/28/2021 12:50   DG CHEST PORT 1 VIEW  Result Date: 11/30/2021 CLINICAL DATA:  Cough, prostate cancer, COVID EXAM: PORTABLE CHEST 1 VIEW COMPARISON:  None. FINDINGS: Right Port-A-Cath tip at the cavoatrial junction. Heart is normal size. Left lung clear. Right hilar and mediastinal adenopathy again noted. Peripheral right upper lobe nodule/mass again noted. No effusions. No acute bony abnormality. IMPRESSION: Right upper lobe mass with right hilar and mediastinal adenopathy. Electronically Signed   By: Rolm Baptise M.D.   On: 11/30/2021 17:32   IR  IMAGING GUIDED PORT INSERTION  Result Date: 11/28/2021 INDICATION: 65 year old with evidence of metastatic disease. Port-A-Cath needed for therapy. EXAM: FLUOROSCOPIC AND ULTRASOUND GUIDED PLACEMENT OF A SUBCUTANEOUS PORT COMPARISON:  None. MEDICATIONS: Moderate sedation ANESTHESIA/SEDATION: Versed 2.0 mg IV; Fentanyl 100 mcg IV; Moderate Sedation Time:  44 minutes The patient was continuously monitored during the procedure by the interventional radiology nurse under my direct supervision. FLUOROSCOPY TIME:  24 seconds, 1 mGy COMPLICATIONS: None immediate. PROCEDURE: The procedure, risks, benefits, and alternatives were explained to the patient. Questions regarding the procedure were encouraged and answered. The patient understands and consents to the procedure. Patient was placed supine on the interventional table. Ultrasound confirmed a patent right internal jugular vein. Ultrasound image was saved for documentation. The right chest and neck were cleaned with a skin antiseptic and a sterile drape was placed. Maximal barrier sterile technique was utilized including caps, mask, sterile gowns, sterile gloves, sterile drape, hand hygiene and skin antiseptic. The right neck was anesthetized with 1% lidocaine. Small incision was made in the right neck with a blade. Micropuncture set was placed in the right internal jugular vein with ultrasound guidance. The micropuncture wire was used for measurement purposes. The right chest was anesthetized with 1% lidocaine with epinephrine. #15 blade was used to make an incision and a subcutaneous port pocket was formed. Fannin was assembled. Subcutaneous tunnel was formed with a stiff tunneling device. The port catheter was brought through the subcutaneous tunnel. The port was placed in the subcutaneous pocket. The micropuncture set was exchanged for a peel-away sheath. The catheter was placed through the peel-away sheath and the tip was positioned at the superior  cavoatrial junction. Catheter placement was confirmed with fluoroscopy. The port was accessed and flushed with heparinized saline. The port pocket was closed using two layers of absorbable sutures and Dermabond. The vein skin site was closed using a single layer of absorbable suture and Dermabond. Sterile dressings were applied. Patient tolerated the procedure well without an immediate complication. Ultrasound and fluoroscopic images were taken and saved for this procedure. IMPRESSION: Placement of a subcutaneous power-injectable port device. Catheter tip at the superior cavoatrial junction. Electronically Signed   By: Markus Daft M.D.   On: 11/28/2021 17:48

## 2021-12-09 NOTE — Assessment & Plan Note (Signed)
He is doing well with radiation treatment I recommend gentle dexamethasone taper

## 2021-12-09 NOTE — Assessment & Plan Note (Signed)
This is due to his malignancy Overall, his sodium is improving I recommend reducing salt tablets and continue to monitor closely

## 2021-12-09 NOTE — Assessment & Plan Note (Signed)
He had constipation while hospitalized but now improving I recommend discontinuation of lactulose

## 2021-12-09 NOTE — Assessment & Plan Note (Signed)
The patient was diagnosed with widespread metastatic small cell lung cancer He was started on palliative chemotherapy and radiation and overall improving I recommend gentle medication taper I will see him again next week for further follow-up and supportive care

## 2021-12-10 ENCOUNTER — Telehealth: Payer: Self-pay

## 2021-12-10 ENCOUNTER — Ambulatory Visit
Admission: RE | Admit: 2021-12-10 | Discharge: 2021-12-10 | Disposition: A | Discharge status: Home / Self Care | Payer: BC Managed Care – PPO | Source: Ambulatory Visit | Attending: Radiation Oncology | Admitting: Radiation Oncology

## 2021-12-10 DIAGNOSIS — Z510 Encounter for antineoplastic radiation therapy: Secondary | ICD-10-CM | POA: Diagnosis not present

## 2021-12-10 NOTE — Telephone Encounter (Signed)
Returned phone call to Ryan [name] regarding [name] anxiety. [name] stated that pt has been very anxious today and she is unsure of how to handle. Jarae took (1) prochlorperazine tablet around 12:00 today and is feeling better. [name] stated she has offered anxiety reducing techniques and will continue to do so if anxiety reoccurs. [name] was instructed to call the office back if [name]'s anxiety worsens.

## 2021-12-11 ENCOUNTER — Ambulatory Visit
Admission: RE | Admit: 2021-12-11 | Discharge: 2021-12-11 | Disposition: A | Discharge status: Home / Self Care | Payer: BC Managed Care – PPO | Source: Ambulatory Visit | Attending: Radiation Oncology | Admitting: Radiation Oncology

## 2021-12-11 ENCOUNTER — Other Ambulatory Visit: Payer: Self-pay

## 2021-12-11 DIAGNOSIS — Z510 Encounter for antineoplastic radiation therapy: Secondary | ICD-10-CM | POA: Diagnosis not present

## 2021-12-12 ENCOUNTER — Ambulatory Visit: Payer: BC Managed Care – PPO

## 2021-12-12 ENCOUNTER — Ambulatory Visit
Admission: RE | Admit: 2021-12-12 | Discharge: 2021-12-12 | Disposition: A | Discharge status: Home / Self Care | Payer: BC Managed Care – PPO | Source: Ambulatory Visit | Attending: Radiation Oncology | Admitting: Radiation Oncology

## 2021-12-12 ENCOUNTER — Encounter: Payer: Self-pay | Admitting: Urology

## 2021-12-12 ENCOUNTER — Inpatient Hospital Stay: Payer: BC Managed Care – PPO | Admitting: Dietician

## 2021-12-12 DIAGNOSIS — Z510 Encounter for antineoplastic radiation therapy: Secondary | ICD-10-CM | POA: Diagnosis not present

## 2021-12-12 DIAGNOSIS — C3491 Malignant neoplasm of unspecified part of right bronchus or lung: Secondary | ICD-10-CM

## 2021-12-12 NOTE — Progress Notes (Signed)
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 He is receiving palliative radiation and chemotherapy with carboplatin/etoposide q21d. Patient is under the care of Dr. Alvy Bimler and Dr. Tammi Klippel.  Past medical history includes prostate cancer, DM2, SIADH, acute hyponatremia -12/7-12/16 hospital admission after presenting with 3 weeks of progressive weakness, poor appetite, weight loss, severe dizziness/disequilibrium  Met with patient in office. He reports appetite is slowly but surely getting better. Patient is positive, reports he is feeling good, has increased energy. Patient eating smaller more frequent meals and snacks. Patient eating a variety of foods (eggs, waffles, bacon, fruit cups, ice cream, pot roast, carrots, peanut butter crackers) He is drinking 2 Ensure Plus, likes vanilla flavor the best. Patient drinks CIB with whole milk occasionally. Patient reports mild acidic taste of foods. He denies nausea, vomiting, diarrhea, constipation.   Nutrition Focused Physical Exam: deferred   Medications: zofran, compazine, decadron, colace, lasix, namenda, metformin, protonix, ambien   Labs: 12/19 - Na 132, Cr 0.55   Anthropometrics: Patient is 13% under reported usual weight; significant  Height: 6'2" Weight: 218 lb  UBW: 250 lb (~6 months ago per pt) BMI: 28.02    NUTRITION DIAGNOSIS: Food and nutrition related knowledge deficit related to cancer as evidenced by no prior nutrition related information needed   INTERVENTION:  Educated on importance of adequate calorie and protein energy intake to maintain strength, weights Continue eating smaller more frequent meals/snacks - handout with ideas provided Encouraged soft, moist high protein foods for ease of intake - handout provided Continue drinking Ensure Plus/equivalent twice daily - would favor adjusting  medications as needed vs drinking lower carbohydrate supplement as these are lower in calories. Patient is monitoring blood glucose Discussed strategies for altered taste - handout provided Suggested using baking soda salt water rinses several times day - recipe given Educated on [facility] support services available - January calendar as well as Insurance risk surveyor information given  MONITORING, EVALUATION, GOAL: Patient will tolerate increased calories and protein to promote weight gain   Next Visit: Monday, January 9 during infusion with Pamala Hurry

## 2021-12-15 ENCOUNTER — Ambulatory Visit: Payer: BC Managed Care – PPO

## 2021-12-16 ENCOUNTER — Ambulatory Visit: Payer: BC Managed Care – PPO

## 2021-12-16 ENCOUNTER — Other Ambulatory Visit: Payer: Self-pay

## 2021-12-16 ENCOUNTER — Encounter: Payer: Self-pay | Admitting: Urology

## 2021-12-16 ENCOUNTER — Ambulatory Visit
Admission: RE | Admit: 2021-12-16 | Discharge: 2021-12-16 | Disposition: A | Discharge status: Home / Self Care | Payer: BC Managed Care – PPO | Source: Ambulatory Visit | Attending: Radiation Oncology | Admitting: Radiation Oncology

## 2021-12-16 DIAGNOSIS — Z510 Encounter for antineoplastic radiation therapy: Secondary | ICD-10-CM | POA: Diagnosis not present

## 2021-12-17 ENCOUNTER — Telehealth: Payer: Self-pay

## 2021-12-17 ENCOUNTER — Ambulatory Visit: Payer: BC Managed Care – PPO

## 2021-12-17 NOTE — Telephone Encounter (Signed)
Called him back. He is complaining that he is not sleeping at night. He is having a lot of anxiety since he was diagnosed with cancer. He has taken Ambien for years and it is not helping. Offered appt with [facility] today, he declined the appt due to no transportation. He will try some benadryl or tylenol pm to see if it helps. Told him I would call back in the am.  Asking for Rx for anxiety or any suggestions. He uses [facility].

## 2021-12-17 NOTE — Telephone Encounter (Signed)
Returned call to [employer]. Ask her to call the office back. Ryan [name] is having difficulty sleeping at night and take Ambien. He is having anxiety during the day. He is taking compazine to help.

## 2021-12-18 ENCOUNTER — Ambulatory Visit: Payer: BC Managed Care – PPO

## 2021-12-18 ENCOUNTER — Encounter: Payer: Self-pay | Admitting: Hematology and Oncology

## 2021-12-18 NOTE — Telephone Encounter (Signed)
Called and given below message. He verbalized understanding. 

## 2021-12-18 NOTE — Telephone Encounter (Signed)
I will address that tomorrow

## 2021-12-19 ENCOUNTER — Inpatient Hospital Stay: Payer: BC Managed Care – PPO

## 2021-12-19 ENCOUNTER — Ambulatory Visit: Payer: BC Managed Care – PPO

## 2021-12-19 ENCOUNTER — Encounter: Payer: Self-pay | Admitting: Hematology and Oncology

## 2021-12-19 ENCOUNTER — Telehealth: Payer: Self-pay | Admitting: *Deleted

## 2021-12-19 ENCOUNTER — Other Ambulatory Visit: Payer: Self-pay

## 2021-12-19 ENCOUNTER — Inpatient Hospital Stay: Payer: BC Managed Care – PPO | Admitting: Hematology and Oncology

## 2021-12-19 DIAGNOSIS — L89302 Pressure ulcer of unspecified buttock, stage 2: Secondary | ICD-10-CM | POA: Diagnosis not present

## 2021-12-19 DIAGNOSIS — C3491 Malignant neoplasm of unspecified part of right bronchus or lung: Secondary | ICD-10-CM | POA: Diagnosis not present

## 2021-12-19 DIAGNOSIS — I959 Hypotension, unspecified: Secondary | ICD-10-CM | POA: Insufficient documentation

## 2021-12-19 DIAGNOSIS — R5381 Other malaise: Secondary | ICD-10-CM | POA: Diagnosis not present

## 2021-12-19 DIAGNOSIS — I9589 Other hypotension: Secondary | ICD-10-CM | POA: Diagnosis not present

## 2021-12-19 DIAGNOSIS — Z510 Encounter for antineoplastic radiation therapy: Secondary | ICD-10-CM | POA: Diagnosis not present

## 2021-12-19 DIAGNOSIS — E222 Syndrome of inappropriate secretion of antidiuretic hormone: Secondary | ICD-10-CM

## 2021-12-19 DIAGNOSIS — E119 Type 2 diabetes mellitus without complications: Secondary | ICD-10-CM

## 2021-12-19 DIAGNOSIS — L8992 Pressure ulcer of unspecified site, stage 2: Secondary | ICD-10-CM | POA: Insufficient documentation

## 2021-12-19 LAB — CBC WITH DIFFERENTIAL/PLATELET
Abs Immature Granulocytes: 0.09 10*3/uL — ABNORMAL HIGH (ref 0.00–0.07)
Basophils Absolute: 0 10*3/uL (ref 0.0–0.1)
Basophils Relative: 0 %
Eosinophils Absolute: 0 10*3/uL (ref 0.0–0.5)
Eosinophils Relative: 0 %
HCT: 36.8 % — ABNORMAL LOW (ref 39.0–52.0)
Hemoglobin: 12.4 g/dL — ABNORMAL LOW (ref 13.0–17.0)
Immature Granulocytes: 1 %
Lymphocytes Relative: 5 %
Lymphs Abs: 0.3 10*3/uL — ABNORMAL LOW (ref 0.7–4.0)
MCH: 30.7 pg (ref 26.0–34.0)
MCHC: 33.7 g/dL (ref 30.0–36.0)
MCV: 91.1 fL (ref 80.0–100.0)
Monocytes Absolute: 0.4 10*3/uL (ref 0.1–1.0)
Monocytes Relative: 7 %
Neutro Abs: 5.5 10*3/uL (ref 1.7–7.7)
Neutrophils Relative %: 87 %
Platelets: 213 10*3/uL (ref 150–400)
RBC: 4.04 MIL/uL — ABNORMAL LOW (ref 4.22–5.81)
RDW: 13.3 % (ref 11.5–15.5)
WBC: 6.3 10*3/uL (ref 4.0–10.5)
nRBC: 0 % (ref 0.0–0.2)

## 2021-12-19 LAB — CMP (CANCER CENTER ONLY)
ALT: 48 U/L — ABNORMAL HIGH (ref 0–44)
AST: 16 U/L (ref 15–41)
Albumin: 3.2 g/dL — ABNORMAL LOW (ref 3.5–5.0)
Alkaline Phosphatase: 89 U/L (ref 38–126)
Anion gap: 11 (ref 5–15)
BUN: 27 mg/dL — ABNORMAL HIGH (ref 8–23)
CO2: 22 mmol/L (ref 22–32)
Calcium: 8.7 mg/dL — ABNORMAL LOW (ref 8.9–10.3)
Chloride: 101 mmol/L (ref 98–111)
Creatinine: 0.55 mg/dL — ABNORMAL LOW (ref 0.61–1.24)
GFR, Estimated: >60 mL/min (ref >60)
Glucose, Bld: 157 mg/dL — ABNORMAL HIGH (ref 70–99)
Potassium: 4.3 mmol/L (ref 3.5–5.1)
Sodium: 134 mmol/L — ABNORMAL LOW (ref 135–145)
Total Bilirubin: 0.5 mg/dL (ref 0.3–1.2)
Total Protein: 7.2 g/dL (ref 6.5–8.1)

## 2021-12-19 LAB — URIC ACID: Uric Acid, Serum: 2.9 mg/dL — ABNORMAL LOW (ref 3.7–8.6)

## 2021-12-19 MED ORDER — DEXAMETHASONE 1 MG PO TABS
1.0000 mg | ORAL_TABLET | Freq: Every day | ORAL | 1 refills | Status: DC
Start: 2021-12-19 — End: 2022-01-08

## 2021-12-19 NOTE — Assessment & Plan Note (Signed)
Overall, the patient is clinically improving although he is weak from all his treatment We discussed the importance of medication taper I am concerned about his low blood pressure and I recommend discontinuation of furosemide and amlodipine Due to his poor oral intake, I recommend against taking insulin or metformin consistently Due to improvement of his sodium level, I recommend discontinuation of his salt tablets I also recommend dexamethasone taper I will see him for aggressive supportive care on a weekly basis while receiving treatment

## 2021-12-19 NOTE — Assessment & Plan Note (Signed)
This is due to recent diarrhea I have discontinue all laxatives I recommend Desitin cream and Tefla dressing

## 2021-12-19 NOTE — Assessment & Plan Note (Signed)
He has lost a lot of weight The hypotension is multifactorial I recommend discontinuation of furosemide and amlodipine

## 2021-12-19 NOTE — Assessment & Plan Note (Signed)
He is quite debilitated after completion of radiation treatment Recommend physical therapy and rehab

## 2021-12-19 NOTE — Assessment & Plan Note (Signed)
His sodium level is improving I recommend discontinuation of salt tablet I also recommend dexamethasone taper with 2 mg of dexamethasone in the morning and 1 mg in the afternoon

## 2021-12-19 NOTE — Progress Notes (Signed)
Ryan [name]  Patient Care Team: Ryan Oman, DO as PCP - General (Internal Medicine) Ryan Hart, RN as Oncology Nurse Navigator (Oncology)  ASSESSMENT & PLAN:  Metastatic primary lung cancer, right ([place]) Overall, the patient is clinically improving although he is weak from all his treatment We discussed the importance of medication taper I am concerned about his low blood pressure and I recommend discontinuation of furosemide and amlodipine Due to his poor oral intake, I recommend against taking insulin or metformin consistently Due to improvement of his sodium level, I recommend discontinuation of his salt tablets I also recommend dexamethasone taper I will see him for aggressive supportive care on a weekly basis while receiving treatment  DM2 (diabetes mellitus, type 2) ([place]) He has very poor appetite With dexamethasone taper, anticipate he will not need insulin I recommend discontinuation of metformin  Hypotension He has lost a lot of weight The hypotension is multifactorial I recommend discontinuation of furosemide and amlodipine  SIADH (syndrome of inappropriate ADH production) ([place]) His sodium level is improving I recommend discontinuation of salt tablet I also recommend dexamethasone taper with 2 mg of dexamethasone in the morning and 1 mg in the afternoon  Stage II pressure sore ([place]) This is due to recent diarrhea I have discontinue all laxatives I recommend Desitin cream and Tefla dressing  Physical debility He is quite debilitated after completion of radiation treatment Recommend physical therapy and rehab  Orders Placed This Encounter  Procedures   CMP ([place] only)   Uric acid   Ambulatory referral to Physical Therapy    Referral Priority:   Routine    Referral Type:   Physical Medicine    Referral Reason:   Specialty Services Required    Requested Specialty:   Physical Therapy    Number of Visits  Requested:   1    All questions were answered. The patient knows to call the clinic with any problems, questions or concerns. The total time spent in the appointment was 55 minutes encounter with patients including review of chart and various tests results, discussions about plan of care and coordination of care plan   Ryan Lark, MD 12/19/2021 5:06 PM  INTERVAL HISTORY: Please see below for problem oriented charting. he returns for treatment follow-up He has completed radiation treatment He had recent loose stool and has developed sore near the sacrum area He has poor energy and poor appetite He has lost a lot of weight He have difficulty sleeping due previous sleeping habits working at night shift and also side effects of dexamethasone Denies nausea or vomiting He complained of dizziness  REVIEW OF SYSTEMS:   Constitutional: Denies fevers, chills or abnormal weight loss Eyes: Denies blurriness of vision Ears, nose, mouth, throat, and face: Denies mucositis or sore throat Cardiovascular: Denies palpitation, chest discomfort or lower extremity swelling Lymphatics: Denies new lymphadenopathy or easy bruising Behavioral/Psych: Mood is stable, no new changes  All other systems were reviewed with the patient and are negative.  I have reviewed the past medical history, past surgical history, social history and family history with the patient and they are unchanged from previous [name].  ALLERGIES:  is allergic to hydralazine and percocet [oxycodone-acetaminophen].  MEDICATIONS:  Current Outpatient Medications  Medication Sig Dispense Refill   dexamethasone (DECADRON) 1 MG tablet Take 1 tablet (1 mg total) by mouth daily. Please dispense, for taper course 30 tablet 1   atorvastatin (LIPITOR) 20 MG tablet Take 20 mg  by mouth daily.     dexamethasone (DECADRON) 4 MG tablet Take 0.5 tablets (2 mg total) by mouth 2 (two) times daily. 60 tablet 1   Elastic Bandages & Supports (ABDOMINAL  BINDER/ELASTIC 2XL) MISC 1 Units by Does not apply route daily. 1 each 0   lidocaine-prilocaine (EMLA) cream Apply 1 application topically as needed. 30 g 3   memantine (NAMENDA) 10 MG tablet Take 1 tablet (10 mg total) by mouth 2 (two) times daily. 60 tablet 4   memantine (NAMENDA) 5 MG tablet Begin this prescription the first day of brain radiation. Week 1: take one tablet po qam. Week 2: take one tablet qam and qpm. Week 3: take two tablets qam, and one tablet po q pm. Week 4: take two tablets qam and qpm. Fill subsequent prescription q month. 70 tablet 0   ondansetron (ZOFRAN) 8 MG tablet Take 1 tablet (8 mg total) by mouth every 8 (eight) hours as needed for nausea or vomiting. 30 tablet 1   pantoprazole (PROTONIX) 40 MG tablet Take 1 tablet (40 mg total) by mouth daily. 30 tablet 2   zolpidem (AMBIEN) 10 MG tablet Take 10 mg by mouth at bedtime as needed for sleep.     No current facility-administered medications for this visit.    SUMMARY OF ONCOLOGIC HISTORY: Oncology History  Metastatic cancer to brain ([place])  11/27/2021 Initial Diagnosis   Metastatic cancer to brain [facility])   12/03/2021 -  Chemotherapy   Patient is on Treatment Plan : LUNG SMALL CELL Carboplatin D1 / Etoposide D1-3 q21d     Metastatic primary lung cancer, right ([place])  11/26/2021 Imaging   There is 2.9 cm lobulated noncalcified pleural-based nodule in the right upper lobe suggesting possible primary malignant neoplasm. There are pathologically enlarged bulky lymph nodes in the mediastinum and both hilar regions, more so on the right side suggesting metastatic lymphadenopathy. There is extrinsic pressure over the right main pulmonary artery and superior vena cava by the enlarged lymph nodes in mediastinum.   Increased interstitial markings and small scattered nodules are seen in the right lung which may be part of pneumonia or pulmonary metastatic disease.   There are nodular densities in both adrenals largest measuring  3 cm in the left adrenal suggesting possible metastatic disease. There are enlarged lymph nodes in the retroperitoneum and mesentery largest measuring 5.9 cm in maximum diameter suggesting metastatic lymphadenopathy.   There are multiple cysts in the liver. There are other indeterminate low-density lesions of varying sizes in the liver. Possibility of hepatic metastatic disease is not excluded.   Head of the pancreas is enlarged in size with lobulated margins. Possibility of neoplastic process in the head of the pancreas is not excluded. Follow-up PET-CT and biopsy as warranted should be considered.   Coronary artery calcifications are seen. Other findings as described in the body of the report.   11/26/2021 - 12/05/2021 Hospital Admission   He was admitted to the hospital and found to have metastatic lung cancer   11/27/2021 Initial Diagnosis   Metastatic primary lung cancer, right ([place])   11/27/2021 Imaging   MRI HEAD IMPRESSION:   1. 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 Mild localized edema with hemorrhagic blood products about several of these lesions without significant regional mass effect or midline shift.  2. 3.2 x 6.6 x 4.8 cm enhancing extra-axial mass at the posterior aspect of the superior sagittal sinus, indeterminate. While this finding could reflect an  additional dural-based metastasis, a possible concomitant meningioma could also be considered.   MRV HEAD IMPRESSION:   1. Invasion and obliteration of the posterior superior sagittal sinus by the above described dural based mass. Small amount of additional nonocclusive thrombus within the superior sagittal sinus anterior to the mass as above. 2. Remainder of the major dural sinuses are otherwise patent.     11/28/2021 Pathology Results   A. PERITONEAL MASS, UPPER ABDOMINAL MIDLINE, BIOPSY:  High-grade neuroendocrine carcinoma, small cell type (Small cell   carcinoma).  Please see comment.   The following immunostains are performed on block A2 with appropriate  controls:  CK7: Negative.  TTF-1: Positive.  Chromogranin: Negative.  Synoptophysin: Negative.  CD56: Positive.  Ki-67: Very high proliferative index (more than 90%).   Comment: Morphologic features of the neoplasm are most consistent with metastasis from a primary small cell carcinoma in the lung.    12/03/2021 -  Chemotherapy   Patient is on Treatment Plan : LUNG SMALL CELL Carboplatin D1 / Etoposide D1-3 q21d     12/03/2021 -  Chemotherapy   Patient is on Treatment Plan :  LUNG SMALL CELL Carboplatin D1 / Etoposide D1-3 q21d     12/09/2021 Cancer Staging   Staging form: Lung, AJCC 8th Edition - Clinical: Stage IV (cT4, cN3, pM1) - Signed by Ryan Lark, MD on 12/09/2021      PHYSICAL EXAMINATION: ECOG PERFORMANCE STATUS: 2 - Symptomatic, <50% confined to bed  Vitals:   12/19/21 1501  BP: 99/71  Pulse: 63  Resp: 18  Temp: 99.1 F (37.3 C)  SpO2: 98%   Filed Weights   12/19/21 1501  Weight: 197 lb 9.6 oz (89.6 kg)    GENERAL:alert, no distress and comfortable SKIN: Noted stage II pressure sore on his sacral area EYES: normal, Conjunctiva are pink and non-injected, sclera clear OROPHARYNX:no exudate, no erythema and lips, buccal mucosa, and tongue normal  NECK: supple, thyroid normal size, non-tender, without nodularity LYMPH:  no palpable lymphadenopathy in the cervical, axillary or inguinal LUNGS: clear to auscultation and percussion with normal breathing effort HEART: regular rate & rhythm and no murmurs and no lower extremity edema ABDOMEN:abdomen soft, non-tender and normal bowel sounds Musculoskeletal:no cyanosis of digits and no clubbing  NEURO: alert & oriented x 3 with fluent speech, no focal motor/sensory deficits  LABORATORY DATA:  I have reviewed the data as listed    Component Value Date/Time   NA 134 (L) 12/19/2021 1426   K 4.3  12/19/2021 1426   CL 101 12/19/2021 1426   CO2 22 12/19/2021 1426   GLUCOSE 157 (H) 12/19/2021 1426   BUN 27 (H) 12/19/2021 1426   CREATININE 0.55 (L) 12/19/2021 1426   CALCIUM 8.7 (L) 12/19/2021 1426   PROT 7.2 12/19/2021 1426   ALBUMIN 3.2 (L) 12/19/2021 1426   AST 16 12/19/2021 1426   ALT 48 (H) 12/19/2021 1426   ALKPHOS 89 12/19/2021 1426   BILITOT 0.5 12/19/2021 1426   GFRNONAA >60 12/19/2021 1426   GFRAA >60 01/01/2019 0726    No results found for: SPEP, UPEP  Lab Results  Component Value Date   WBC 6.3 12/19/2021   NEUTROABS 5.5 12/19/2021   HGB 12.4 (L) 12/19/2021   HCT 36.8 (L) 12/19/2021   MCV 91.1 12/19/2021   PLT 213 12/19/2021      Chemistry      Component Value Date/Time   NA 134 (L) 12/19/2021 1426   K 4.3 12/19/2021 1426   CL 101  12/19/2021 1426   CO2 22 12/19/2021 1426   BUN 27 (H) 12/19/2021 1426   CREATININE 0.55 (L) 12/19/2021 1426      Component Value Date/Time   CALCIUM 8.7 (L) 12/19/2021 1426   ALKPHOS 89 12/19/2021 1426   AST 16 12/19/2021 1426   ALT 48 (H) 12/19/2021 1426   BILITOT 0.5 12/19/2021 1426       RADIOGRAPHIC STUDIES: I have personally reviewed the radiological images as listed and agreed with the findings in the report. CT Head Wo Contrast  Result Date: 11/26/2021 CLINICAL DATA:  Dizziness and disequilibrium. Symptoms for 1 month. COVID 3 weeks ago. Prostate cancer with new lung mass. EXAM: CT HEAD WITHOUT CONTRAST TECHNIQUE: Contiguous axial images were obtained from the base of the skull through the vertex without intravenous contrast. COMPARISON:  None. FINDINGS: Brain: A midline extra-axial hyperdense mass lesion surrounds the superior sagittal sinus. The lesion measures 5.9 x 3.5 x 4.4 cm. An area of hypoattenuation is present in the posterior right frontal lobe adjacent to the lesion. There is mass effect on the posterior frontal and parietal lobes bilaterally. Separate hyperdense focus is present in the cortical or  subcortical left parietal lobe on axial image 24 of series 5. Similar subcortical hyperdensity is present in the left occipital lobe on axial image 17 of series 5. Areas of hypoattenuation are present in the cerebellum. The largest is in midline with some surrounding hyperdensity, also concerning for a metastatic lesion. It measures 2.2 x 2.1 cm on saddle sagittal image 29 of series 4. Smaller hypodense left cystic lesions the cerebellum are also concerning metastases. Infarct is considered less likely. A hypodense lesion in the right temporal lobe measures 11 mm. The ventricles are of normal size.  No significant fluid is present Vascular: No hyperdense vessel or unexpected calcification. Skull: Calvarium is intact. No focal lytic or blastic lesions are present. No significant extracranial soft tissue lesion is present. Sinuses/Orbits: Right maxillary sinus opacification is likely chronic. There is some wall thickening. Minimal air is present in the sinus. The paranasal sinuses and mastoid air cells are otherwise clear. The globes and orbits are within normal limits. IMPRESSION: 1. 5.9 x 3.5 x 4.4 cm midline extra-axial hyperdense mass lesion surrounds the superior sagittal sinus. This most likely represents a metastasis. Meningioma is considered less likely. 2. Other hyperdense lesions are present in the cortical or subcortical left parietal lobe, left occipital lobe, and right temporal lobe concerning for metastatic disease. 3. There is mass effect on the posterior frontal and parietal lobes bilaterally. 4. Recommend MRI the brain without and contrast for further evaluation of these lesions. 5. Chronic right maxillary sinus disease. These results were called by telephone at the time of interpretation on 11/26/2021 at 1:30 pm to provider Dr. Melina Copa, who verbally acknowledged these results. Electronically Signed   By: San Morelle M.D.   On: 11/26/2021 13:46   MR Brain W and Wo Contrast  Result Date:  11/28/2021 CLINICAL DATA:  Initial evaluation for brain mass. EXAM: MRI HEAD WITHOUT AND WITH CONTRAST MRV HEAD WITHOUT CONTRAST TECHNIQUE: Multiplanar, multiecho pulse sequences of the brain and surrounding structures were obtained without and with intravenous contrast. Angiographic images of the intracranial venous structures were obtained using MRV technique without intravenous contrast. CONTRAST:  33m GADAVIST GADOBUTROL 1 MMOL/ML IV SOLN COMPARISON:  Prior CT from 11/26/2021. FINDINGS: MRI HEAD FINDINGS: Brain: Cerebral volume within normal limits. Multiple scattered intraparenchymal lesions are seen involving both cerebral hemispheres as well as the  cerebellum, consistent with widespread metastatic disease. There are at least 20 lesions. For reference purposes, the largest lesion within the right cerebral hemisphere is present at the right temporal lobe in measures 1.5 x 1.2 x 0.7 cm (series 26, image 22). The largest lesion within the left cerebral hemisphere is positioned at the left occipital pole and measures 1.5 x 1.7 x 1.7 cm (series 26, image 21). Largest distinct infratentorial lesion is seen within the central cerebellum and measures 2.1 x 2.2 x 2.5 cm (series 26, image 12). Several of these lesions are somewhat cystic in appearance. Few lesions demonstrate intrinsic T1 hyperintensity and/or SWI signal, consistent with hemorrhage and/or blood products, most notable at the central cerebellum (series 11, image 11). Mild localized edema about several of these lesions without significant regional mass effect. An additional large enhancing extra-axial mass positioned at the posterior aspect of the superior sagittal sinus measures 3.2 x 6.6 x 4.8 cm (series 28, image 13). Scattered areas of internal susceptibility artifact consistent with blood products. Mild localized vasogenic edema without significant mass effect. This lesion is somewhat indeterminate, and could reflect an additional dural-based  metastasis. Concomitant meningioma could also be considered. This lesion invades and obliterates the adjacent superior sagittal sinus, better evaluated on concomitant MRV. Mild diffuse smooth pachymeningeal thickening and enhancement is likely secondary. No definite evidence for acute or subacute ischemia. [name] made of a punctate focus of diffusion abnormality at the right basal ganglia, favored to reflect a small metastasis rather than infarct (series 5, image 75). No midline shift or hydrocephalus. No other extra-axial fluid collection. Pituitary gland suprasellar region within normal limits. Vascular: Major intracranial arterial vascular flow voids are maintained. Skull and upper cervical spine: Craniocervical junction within normal limits. Heterogeneous signal intensity seen within the visualized bone marrow without focal marrow replacing lesion. No scalp soft tissue abnormality. Sinuses/Orbits: Globes within normal limits. Increased CSF signal intensity seen along the optic nerve sheaths bilaterally, suggesting elevated intracranial pressures. Orbital soft tissues otherwise unremarkable. Moderate mucosal thickening within the right maxillary sinus. Paranasal sinuses are otherwise largely clear. Small right with trace left mastoid effusions. Other: None. MRV HEAD FINDINGS: The above described dural based mass again noted at the posterior aspect of the superior sagittal sinus. The mass invades and obliterates the superior sagittal sinus at this level (series 1, image 98). Tubular filling defect is seen within the superior sagittal sinus anterior to the mass, likely a small amount of nonocclusive thrombus (series 13, images 88, 102). Superior sagittal sinus otherwise patent anteriorly. Additional extension of the lesion to involve the posterior aspect of the suture superior sagittal sinus as it courses towards the torcula (series 12, image 82) the torcula itself is patent. Transverse and sigmoid sinuses are patent  as are the visualized proximal internal jugular veins. Straight sinus, vein of Galen, inferior sagittal sinus, internal cerebral veins, and basal veins of Rosenthal are patent. No abnormality about the cavernous sinus. Superior orbital veins symmetric and within normal limits. No appreciable cortical venous thrombosis. IMPRESSION: MRI HEAD IMPRESSION: 1. 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 Mild localized edema with hemorrhagic blood products about several of these lesions without significant regional mass effect or midline shift. 2. 3.2 x 6.6 x 4.8 cm enhancing extra-axial mass at the posterior aspect of the superior sagittal sinus, indeterminate. While this finding could reflect an additional dural-based metastasis, a possible concomitant meningioma could also be considered. MRV HEAD IMPRESSION: 1. Invasion and obliteration of the posterior  superior sagittal sinus by the above described dural based mass. Small amount of additional nonocclusive thrombus within the superior sagittal sinus anterior to the mass as above. 2. Remainder of the major dural sinuses are otherwise patent. Electronically Signed   By: Jeannine Boga M.D.   On: 11/28/2021 00:09   MR Venogram Head  Result Date: 11/28/2021 CLINICAL DATA:  Initial evaluation for brain mass. EXAM: MRI HEAD WITHOUT AND WITH CONTRAST MRV HEAD WITHOUT CONTRAST TECHNIQUE: Multiplanar, multiecho pulse sequences of the brain and surrounding structures were obtained without and with intravenous contrast. Angiographic images of the intracranial venous structures were obtained using MRV technique without intravenous contrast. CONTRAST:  29m GADAVIST GADOBUTROL 1 MMOL/ML IV SOLN COMPARISON:  Prior CT from 11/26/2021. FINDINGS: MRI HEAD FINDINGS: Brain: Cerebral volume within normal limits. Multiple scattered intraparenchymal lesions are seen involving both cerebral hemispheres as well as the  cerebellum, consistent with widespread metastatic disease. There are at least 20 lesions. For reference purposes, the largest lesion within the right cerebral hemisphere is present at the right temporal lobe in measures 1.5 x 1.2 x 0.7 cm (series 26, image 22). The largest lesion within the left cerebral hemisphere is positioned at the left occipital pole and measures 1.5 x 1.7 x 1.7 cm (series 26, image 21). Largest distinct infratentorial lesion is seen within the central cerebellum and measures 2.1 x 2.2 x 2.5 cm (series 26, image 12). Several of these lesions are somewhat cystic in appearance. Few lesions demonstrate intrinsic T1 hyperintensity and/or SWI signal, consistent with hemorrhage and/or blood products, most notable at the central cerebellum (series 11, image 11). Mild localized edema about several of these lesions without significant regional mass effect. An additional large enhancing extra-axial mass positioned at the posterior aspect of the superior sagittal sinus measures 3.2 x 6.6 x 4.8 cm (series 28, image 13). Scattered areas of internal susceptibility artifact consistent with blood products. Mild localized vasogenic edema without significant mass effect. This lesion is somewhat indeterminate, and could reflect an additional dural-based metastasis. Concomitant meningioma could also be considered. This lesion invades and obliterates the adjacent superior sagittal sinus, better evaluated on concomitant MRV. Mild diffuse smooth pachymeningeal thickening and enhancement is likely secondary. No definite evidence for acute or subacute ischemia. [name] made of a punctate focus of diffusion abnormality at the right basal ganglia, favored to reflect a small metastasis rather than infarct (series 5, image 75). No midline shift or hydrocephalus. No other extra-axial fluid collection. Pituitary gland suprasellar region within normal limits. Vascular: Major intracranial arterial vascular flow voids are  maintained. Skull and upper cervical spine: Craniocervical junction within normal limits. Heterogeneous signal intensity seen within the visualized bone marrow without focal marrow replacing lesion. No scalp soft tissue abnormality. Sinuses/Orbits: Globes within normal limits. Increased CSF signal intensity seen along the optic nerve sheaths bilaterally, suggesting elevated intracranial pressures. Orbital soft tissues otherwise unremarkable. Moderate mucosal thickening within the right maxillary sinus. Paranasal sinuses are otherwise largely clear. Small right with trace left mastoid effusions. Other: None. MRV HEAD FINDINGS: The above described dural based mass again noted at the posterior aspect of the superior sagittal sinus. The mass invades and obliterates the superior sagittal sinus at this level (series 1, image 98). Tubular filling defect is seen within the superior sagittal sinus anterior to the mass, likely a small amount of nonocclusive thrombus (series 13, images 88, 102). Superior sagittal sinus otherwise patent anteriorly. Additional extension of the lesion to involve the posterior aspect of the suture superior sagittal  sinus as it courses towards the torcula (series 12, image 82) the torcula itself is patent. Transverse and sigmoid sinuses are patent as are the visualized proximal internal jugular veins. Straight sinus, vein of Galen, inferior sagittal sinus, internal cerebral veins, and basal veins of Rosenthal are patent. No abnormality about the cavernous sinus. Superior orbital veins symmetric and within normal limits. No appreciable cortical venous thrombosis. IMPRESSION: MRI HEAD IMPRESSION: 1. 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 Mild localized edema with hemorrhagic blood products about several of these lesions without significant regional mass effect or midline shift. 2. 3.2 x 6.6 x 4.8 cm enhancing extra-axial mass at the  posterior aspect of the superior sagittal sinus, indeterminate. While this finding could reflect an additional dural-based metastasis, a possible concomitant meningioma could also be considered. MRV HEAD IMPRESSION: 1. Invasion and obliteration of the posterior superior sagittal sinus by the above described dural based mass. Small amount of additional nonocclusive thrombus within the superior sagittal sinus anterior to the mass as above. 2. Remainder of the major dural sinuses are otherwise patent. Electronically Signed   By: Jeannine Boga M.D.   On: 11/28/2021 00:09   CT CHEST ABDOMEN PELVIS W CONTRAST  Result Date: 11/26/2021 CLINICAL DATA:  Persistent cough, shortness of breath EXAM: CT CHEST, ABDOMEN, AND PELVIS WITH CONTRAST TECHNIQUE: Multidetector CT imaging of the chest, abdomen and pelvis was performed following the standard protocol during bolus administration of intravenous contrast. CONTRAST:  166m OMNIPAQUE IOHEXOL 300 MG/ML  SOLN COMPARISON:  Previous studies including chest radiographs done on 11/24/2021 FINDINGS: CT CHEST FINDINGS Cardiovascular: Coronary artery calcifications are seen. There is homogeneous enhancement in thoracic aorta. There are no intraluminal filling defects in central pulmonary artery branches. There is extrinsic compression of right main pulmonary artery caused by pathologically enlarged lymph nodes in mediastinum and right hilum. There is extrinsic pressure over the superior vena cava caused by lymphadenopathy. Mediastinum/Nodes: There is bulky lymphadenopathy in the mediastinum extending to the right hilum measuring approximately 8.6 x 8.6 x 9.5 cm. There is 2.6 x 1.9 cm node in the left hilum. Lungs/Pleura: Centrilobular emphysema is seen. In image 54 of series 4, there is 2.9 x 2.4 cm pleural-based noncalcified nodule in the right upper lobe. There are increased interstitial markings in the right upper lobe. In the image 71, there is 12 mm nodular density in the  right upper lobe. In the image 73, there is 6 mm nodule in the right upper lobe. There are 3 small nodular densities each measuring less than 9 mm in size in the right lower lobe. Musculoskeletal: Unremarkable. CT ABDOMEN PELVIS FINDINGS Hepatobiliary: Liver measures 17.5 cm in length. In the image 58 of series 2, there is 1.6 cm fluid density lesion in the left lobe, possibly cyst. There are other scattered low-density foci in the right lobe some of which may be cysts. Possibility of space-occupying neoplastic process is not excluded. There are slightly enlarged lymph nodes anterior to the liver and adjacent to the pericardium largest measuring 2.7 x 1.7 cm. There is no dilation of bile ducts. Gallbladder is not distended. Pancreas: Head of the pancreas is prominent in size. There is no dilation of pancreatic duct. No focal abnormality is seen in the body and tail of pancreas. Spleen: Spleen is not enlarged. Adrenals/Urinary Tract: There is 3 cm nodule in the left adrenal. There is 2 cm nodule in the right adrenal. There is no hydronephrosis. There are no renal or ureteral  stones. There are smooth marginated nodular densities adjacent to the kidneys largest measuring 3.6 cm posterior to the upper pole of right kidney. Density measurements in this lesions are higher than usual for simple cysts. Ureters not dilated. Urinary bladder is not distended. Stomach/Bowel: Stomach is unremarkable. Small bowel loops are not dilated. Appendix is not dilated. There is no significant wall thickening in colon. Vascular/Lymphatic: There are scattered atherosclerotic plaques and calcifications in the aorta and its major branches. There are pathologically enlarged lymph nodes in the mesentery and retroperitoneum. Largest of these nodes is noted anterior to the transverse colon measuring 5.9 x 3 cm. There are multiple other smaller nodules of varying sizes in the abdomen in the mesentery and retroperitoneum. Reproductive: Prostate is  not enlarged. There is fluid density in the the region of prostatic urethra. This may suggest previous prostate surgery. Other: In image 76 of series 2, there is a 12 mm subcutaneous nodule in the right anterior abdominal wall. Small bilateral inguinal hernias containing fat are seen. Musculoskeletal: Unremarkable. IMPRESSION: There is 2.9 cm lobulated noncalcified pleural-based nodule in the right upper lobe suggesting possible primary malignant neoplasm. There are pathologically enlarged bulky lymph nodes in the mediastinum and both hilar regions, more so on the right side suggesting metastatic lymphadenopathy. There is extrinsic pressure over the right main pulmonary artery and superior vena cava by the enlarged lymph nodes in mediastinum. Increased interstitial markings and small scattered nodules are seen in the right lung which may be part of pneumonia or pulmonary metastatic disease. There are nodular densities in both adrenals largest measuring 3 cm in the left adrenal suggesting possible metastatic disease. There are enlarged lymph nodes in the retroperitoneum and mesentery largest measuring 5.9 cm in maximum diameter suggesting metastatic lymphadenopathy. There are multiple cysts in the liver. There are other indeterminate low-density lesions of varying sizes in the liver. Possibility of hepatic metastatic disease is not excluded. Head of the pancreas is enlarged in size with lobulated margins. Possibility of neoplastic process in the head of the pancreas is not excluded. Follow-up PET-CT and biopsy as warranted should be considered. Coronary artery calcifications are seen. Other findings as described in the body of the report. Electronically Signed   By: Elmer Picker M.D.   On: 11/26/2021 13:49   CT BIOPSY  Result Date: 11/28/2021 INDICATION: 65 year old with evidence of diffuse metastatic disease. Tissue diagnosis is needed. EXAM: CT-GUIDED BIOPSY OF ANTERIOR ABDOMINAL PERITONEAL MASS  MEDICATIONS: Moderate sedation ANESTHESIA/SEDATION: Moderate (conscious) sedation was employed during this procedure. A total of Versed 2.53m and fentanyl 100 mcg was administered intravenously at the order of the provider performing the procedure. Total intra-service moderate sedation time: 14 minutes. Patient's level of consciousness and vital signs were monitored continuously by radiology nurse throughout the procedure under the supervision of the provider performing the procedure. FLUOROSCOPY TIME:  None COMPLICATIONS: None immediate. PROCEDURE: Informed written consent was obtained from the patient after a thorough discussion of the procedural risks, benefits and alternatives. All questions were addressed. A timeout was performed prior to the initiation of the procedure. Patient was placed supine on CT scanner. Images of the upper abdomen are obtained. The large mass in the anterior upper abdomen was identified and targeted. The skin was prepped with chlorhexidine and sterile field was created. Skin and soft tissues were anesthetized with 1% lidocaine. A small incision was made. Using CT guidance, 17 gauge coaxial needle was directed into the anterior abdominal mass. Multiple core biopsies were obtained with an 18  gauge core device and specimens were placed in formalin. 17 gauge needle was removed without complication. Bandage placed over the puncture site. FINDINGS: Again noted is large mass in the anterior upper abdomen adjacent to the abdominal wall. This structure measures up to 5.1 cm. Needle position was confirmed within the lesion. Specimens placed in formalin. IMPRESSION: CT-guided core biopsies of the anterior upper abdominal mass. Electronically Signed   By: Markus Daft M.D.   On: 11/28/2021 12:50   DG CHEST PORT 1 VIEW  Result Date: 11/30/2021 CLINICAL DATA:  Cough, prostate cancer, COVID EXAM: PORTABLE CHEST 1 VIEW COMPARISON:  None. FINDINGS: Right Port-A-Cath tip at the cavoatrial junction.  Heart is normal size. Left lung clear. Right hilar and mediastinal adenopathy again noted. Peripheral right upper lobe nodule/mass again noted. No effusions. No acute bony abnormality. IMPRESSION: Right upper lobe mass with right hilar and mediastinal adenopathy. Electronically Signed   By: Rolm Baptise M.D.   On: 11/30/2021 17:32   IR IMAGING GUIDED PORT INSERTION  Result Date: 11/28/2021 INDICATION: 65 year old with evidence of metastatic disease. Port-A-Cath needed for therapy. EXAM: FLUOROSCOPIC AND ULTRASOUND GUIDED PLACEMENT OF A SUBCUTANEOUS PORT COMPARISON:  None. MEDICATIONS: Moderate sedation ANESTHESIA/SEDATION: Versed 2.0 mg IV; Fentanyl 100 mcg IV; Moderate Sedation Time:  44 minutes The patient was continuously monitored during the procedure by the interventional radiology nurse under my direct supervision. FLUOROSCOPY TIME:  24 seconds, 1 mGy COMPLICATIONS: None immediate. PROCEDURE: The procedure, risks, benefits, and alternatives were explained to the patient. Questions regarding the procedure were encouraged and answered. The patient understands and consents to the procedure. Patient was placed supine on the interventional table. Ultrasound confirmed a patent right internal jugular vein. Ultrasound image was saved for documentation. The right chest and neck were cleaned with a skin antiseptic and a sterile drape was placed. Maximal barrier sterile technique was utilized including caps, mask, sterile gowns, sterile gloves, sterile drape, hand hygiene and skin antiseptic. The right neck was anesthetized with 1% lidocaine. Small incision was made in the right neck with a blade. Micropuncture set was placed in the right internal jugular vein with ultrasound guidance. The micropuncture wire was used for measurement purposes. The right chest was anesthetized with 1% lidocaine with epinephrine. #15 blade was used to make an incision and a subcutaneous port pocket was formed. Tangerine was  assembled. Subcutaneous tunnel was formed with a stiff tunneling device. The port catheter was brought through the subcutaneous tunnel. The port was placed in the subcutaneous pocket. The micropuncture set was exchanged for a peel-away sheath. The catheter was placed through the peel-away sheath and the tip was positioned at the superior cavoatrial junction. Catheter placement was confirmed with fluoroscopy. The port was accessed and flushed with heparinized saline. The port pocket was closed using two layers of absorbable sutures and Dermabond. The vein skin site was closed using a single layer of absorbable suture and Dermabond. Sterile dressings were applied. Patient tolerated the procedure well without an immediate complication. Ultrasound and fluoroscopic images were taken and saved for this procedure. IMPRESSION: Placement of a subcutaneous power-injectable port device. Catheter tip at the superior cavoatrial junction. Electronically Signed   By: Markus Daft M.D.   On: 11/28/2021 17:48

## 2021-12-19 NOTE — Telephone Encounter (Signed)
Connected with [facility] [phone] to notify to use date of authorization or later to process order for generic EMLA cream.  Current authorization effective 12/09/2021.  [facility] reports processed earlier today and already called [employer].

## 2021-12-19 NOTE — Assessment & Plan Note (Signed)
He has very poor appetite With dexamethasone taper, anticipate he will not need insulin I recommend discontinuation of metformin

## 2021-12-23 ENCOUNTER — Ambulatory Visit: Payer: BC Managed Care – PPO

## 2021-12-25 ENCOUNTER — Inpatient Hospital Stay: Payer: BC Managed Care – PPO | Attending: Hematology and Oncology

## 2021-12-25 ENCOUNTER — Ambulatory Visit: Payer: BC Managed Care – PPO | Attending: Hematology and Oncology

## 2021-12-25 ENCOUNTER — Telehealth: Payer: Self-pay

## 2021-12-25 ENCOUNTER — Other Ambulatory Visit: Payer: Self-pay

## 2021-12-25 ENCOUNTER — Inpatient Hospital Stay: Payer: BC Managed Care – PPO | Admitting: Hematology and Oncology

## 2021-12-25 VITALS — BP 122/63 | HR 116 | Temp 97.7°F | Resp 18 | Wt 191.4 lb

## 2021-12-25 DIAGNOSIS — E119 Type 2 diabetes mellitus without complications: Secondary | ICD-10-CM | POA: Insufficient documentation

## 2021-12-25 DIAGNOSIS — Z79899 Other long term (current) drug therapy: Secondary | ICD-10-CM | POA: Diagnosis not present

## 2021-12-25 DIAGNOSIS — I959 Hypotension, unspecified: Secondary | ICD-10-CM | POA: Diagnosis not present

## 2021-12-25 DIAGNOSIS — R634 Abnormal weight loss: Secondary | ICD-10-CM

## 2021-12-25 DIAGNOSIS — L89302 Pressure ulcer of unspecified buttock, stage 2: Secondary | ICD-10-CM

## 2021-12-25 DIAGNOSIS — R54 Age-related physical debility: Secondary | ICD-10-CM

## 2021-12-25 DIAGNOSIS — C3490 Malignant neoplasm of unspecified part of unspecified bronchus or lung: Secondary | ICD-10-CM

## 2021-12-25 DIAGNOSIS — Z5189 Encounter for other specified aftercare: Secondary | ICD-10-CM | POA: Insufficient documentation

## 2021-12-25 DIAGNOSIS — C3491 Malignant neoplasm of unspecified part of right bronchus or lung: Secondary | ICD-10-CM | POA: Insufficient documentation

## 2021-12-25 DIAGNOSIS — L8992 Pressure ulcer of unspecified site, stage 2: Secondary | ICD-10-CM | POA: Diagnosis not present

## 2021-12-25 DIAGNOSIS — E222 Syndrome of inappropriate secretion of antidiuretic hormone: Secondary | ICD-10-CM | POA: Diagnosis not present

## 2021-12-25 DIAGNOSIS — Z5111 Encounter for antineoplastic chemotherapy: Secondary | ICD-10-CM | POA: Insufficient documentation

## 2021-12-25 DIAGNOSIS — R5381 Other malaise: Secondary | ICD-10-CM | POA: Insufficient documentation

## 2021-12-25 DIAGNOSIS — C7931 Secondary malignant neoplasm of brain: Secondary | ICD-10-CM | POA: Insufficient documentation

## 2021-12-25 DIAGNOSIS — I9589 Other hypotension: Secondary | ICD-10-CM

## 2021-12-25 LAB — COMPREHENSIVE METABOLIC PANEL
ALT: 56 U/L — ABNORMAL HIGH (ref 0–44)
AST: 16 U/L (ref 15–41)
Albumin: 3.4 g/dL — ABNORMAL LOW (ref 3.5–5.0)
Alkaline Phosphatase: 97 U/L (ref 38–126)
Anion gap: 8 (ref 5–15)
BUN: 30 mg/dL — ABNORMAL HIGH (ref 8–23)
CO2: 23 mmol/L (ref 22–32)
Calcium: 8.7 mg/dL — ABNORMAL LOW (ref 8.9–10.3)
Chloride: 105 mmol/L (ref 98–111)
Creatinine, Ser: 0.56 mg/dL — ABNORMAL LOW (ref 0.61–1.24)
GFR, Estimated: >60 mL/min (ref >60)
Glucose, Bld: 293 mg/dL — ABNORMAL HIGH (ref 70–99)
Potassium: 3.9 mmol/L (ref 3.5–5.1)
Sodium: 136 mmol/L (ref 135–145)
Total Bilirubin: 0.5 mg/dL (ref 0.3–1.2)
Total Protein: 6.8 g/dL (ref 6.5–8.1)

## 2021-12-25 LAB — CBC WITH DIFFERENTIAL/PLATELET
Abs Immature Granulocytes: 0.1 10*3/uL — ABNORMAL HIGH (ref 0.00–0.07)
Basophils Absolute: 0.1 10*3/uL (ref 0.0–0.1)
Basophils Relative: 0 %
Eosinophils Absolute: 0 10*3/uL (ref 0.0–0.5)
Eosinophils Relative: 0 %
HCT: 38.2 % — ABNORMAL LOW (ref 39.0–52.0)
Hemoglobin: 13.2 g/dL (ref 13.0–17.0)
Immature Granulocytes: 1 %
Lymphocytes Relative: 10 %
Lymphs Abs: 1.3 10*3/uL (ref 0.7–4.0)
MCH: 31.1 pg (ref 26.0–34.0)
MCHC: 34.6 g/dL (ref 30.0–36.0)
MCV: 90.1 fL (ref 80.0–100.0)
Monocytes Absolute: 0.7 10*3/uL (ref 0.1–1.0)
Monocytes Relative: 5 %
Neutro Abs: 11.1 10*3/uL — ABNORMAL HIGH (ref 1.7–7.7)
Neutrophils Relative %: 84 %
Platelets: 300 10*3/uL (ref 150–400)
RBC: 4.24 MIL/uL (ref 4.22–5.81)
RDW: 13.8 % (ref 11.5–15.5)
WBC: 13.2 10*3/uL — ABNORMAL HIGH (ref 4.0–10.5)
nRBC: 0 % (ref 0.0–0.2)

## 2021-12-25 MED ORDER — MIRTAZAPINE 30 MG PO TABS: 30.0000 mg | ORAL_TABLET | Freq: Every day | ORAL | 1 refills | Status: DC

## 2021-12-25 MED ORDER — ZOLPIDEM TARTRATE 10 MG PO TABS: 10.0000 mg | ORAL_TABLET | Freq: Every evening | ORAL | 0 refills | Status: DC | PRN

## 2021-12-25 NOTE — Telephone Encounter (Signed)
Called and given below message. Ryan [name] verbalized understanding.

## 2021-12-25 NOTE — Therapy (Signed)
[facility] @ [address] [place], [place], [address] Phone: [phone]   Fax:  [phone]  Patient Details  Name: Ryan [name] MRN: [id number] Date of Birth: December 10, 1956 Referring Provider:  [name], MD  Encounter Date: 12/25/2021 Pt arrived for therapy and required a WC to get from the front lobby to the treatment room.  After speaking with pt and his daughter it was determined that he might be more appropriate for HHPT secondary to his level of deconditioning and regression since last week. We briefly discussed HHPT and they were in agreement.  An in box was sent to Dr. Alvy Bimler to see if she was in agreement and would contact the pt directly about setting this up.     Claris Pong, PT 12/25/2021, 1:36 PM  [facility] @ [address] [place], [place], [address] Phone: [phone]   Fax:  [phone]

## 2021-12-25 NOTE — Telephone Encounter (Signed)
-----   Message from [name], MD sent at 12/25/2021 12:05 PM EST ----- Pls call his significant other Sodium is now normal Blood sugar is very high, due to steroids and shakes

## 2021-12-26 ENCOUNTER — Encounter: Payer: Self-pay | Admitting: Hematology and Oncology

## 2021-12-26 ENCOUNTER — Telehealth: Payer: Self-pay | Admitting: Hematology and Oncology

## 2021-12-26 ENCOUNTER — Telehealth: Payer: Self-pay

## 2021-12-26 DIAGNOSIS — R634 Abnormal weight loss: Secondary | ICD-10-CM | POA: Insufficient documentation

## 2021-12-26 MED FILL — Dexamethasone Sodium Phosphate Inj 100 MG/10ML: INTRAMUSCULAR | Qty: 1 | Status: AC

## 2021-12-26 MED FILL — Fosaprepitant Dimeglumine For IV Infusion 150 MG (Base Eq): INTRAVENOUS | Qty: 5 | Status: AC

## 2021-12-26 NOTE — Assessment & Plan Note (Signed)
This has resolved I am hopeful he will have positive response to treatment

## 2021-12-26 NOTE — Progress Notes (Signed)
[facility] OFF[facility] PROGRESS NOTE  Patient Care Team: [name], DO as PCP - General (Internal Medicine) Valrie Hart, RN as Oncology Nurse Navigator (Oncology)  ASSESSMENT & PLAN:  Metastatic primary lung cancer, right [facility]) Clinically, his labs has improved dramatically We will proceed with treatment next week as scheduled After that, I plan to order PET/CT imaging for objective assessment of response to treatment The patient need additional aggressive supportive measures I will try to address his needs on a weekly basis  SIADH (syndrome of inappropriate ADH production) ([place]) This has resolved I am hopeful he will have positive response to treatment  DM2 (diabetes mellitus, type 2) ([place]) This is difficult to treat as he has inconsistent diet and have difficulties tolerating normal food I will monitor it closely For now, I will hold off prescribing metformin or insulin due to risk of hypoglycemia  Stage II pressure sore ([place]) This is improving I recommend home health nurse to check on him  Hypotension He is tachycardic and somewhat hypotensive due to poor oral intake I encouraged the patient to increase oral intake as tolerated  Physical debility I have received communication from physical therapist who felt that outpatient therapy would be impossible for him due to his profound weakness I will try to consult home health and home physical therapy  Weight loss 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    Answer:   [facility]   Ambulatory referral to Home Health    Referral Priority:   Routine    Referral Type:   Home Health Care    Referral Reason:   Specialty Services Required    Requested Specialty:   [place]    Number of Visits Requested:   1    All questions were answered. The patient knows to call the clinic with any problems, questions or concerns. The total time spent in the appointment was 40 minutes encounter with patients including review of chart and various tests results, discussions about plan of care and coordination of care plan   [name], MD 12/26/2021 10:48 AM  INTERVAL HISTORY: Please see below for problem oriented charting. he returns for treatment follow-up seen prior to cycle 2 of chemotherapy He has lost a lot of weight His appetite is very poor and the patient declined eating because of altered taste sensation His diarrhea has stopped He has improvement of his decubitus ulcer He is very weak and spent a lot of time not moving around He is still bothered a lot due to lack of sleep  REVIEW OF SYSTEMS:   Constitutional: Denies fevers, chills  Eyes: Denies blurriness of vision Ears, nose, mouth, throat, and face: Denies mucositis or sore throat Respiratory: Denies cough, dyspnea or wheezes Cardiovascular: Denies palpitation, chest discomfort or lower extremity swelling Lymphatics: Denies new lymphadenopathy or  easy bruising Behavioral/Psych: Mood is stable, no new changes  All other systems were reviewed with the patient and are negative.  I have reviewed the past medical history, past surgical history, social history and family history with the patient and they are unchanged from previous note.  ALLERGIES:  is allergic to hydralazine and percocet [oxycodone-acetaminophen].  MEDICATIONS:  Current Outpatient Medications   Medication Sig Dispense Refill   mirtazapine (REMERON) 30 MG tablet Take 1 tablet (30 mg total) by mouth at bedtime. 30 tablet 1   atorvastatin (LIPITOR) 20 MG tablet Take 20 mg by mouth daily.     dexamethasone (DECADRON) 1 MG tablet Take 1 tablet (1 mg total) by mouth daily. Please dispense, for taper course 30 tablet 1   dexamethasone (DECADRON) 4 MG tablet Take 0.5 tablets (2 mg total) by mouth 2 (two) times daily. 60 tablet 1   Elastic Bandages & Supports (ABDOMINAL BINDER/ELASTIC 2XL) MISC 1 Units by Does not apply route daily. 1 each 0   lidocaine-prilocaine (EMLA) cream Apply 1 application topically as needed. 30 g 3   memantine (NAMENDA) 10 MG tablet Take 1 tablet (10 mg total) by mouth 2 (two) times daily. 60 tablet 4   memantine (NAMENDA) 5 MG tablet Begin this prescription the first day of brain radiation. Week 1: take one tablet po qam. Week 2: take one tablet qam and qpm. Week 3: take two tablets qam, and one tablet po q pm. Week 4: take two tablets qam and qpm. Fill subsequent prescription q month. 70 tablet 0   ondansetron (ZOFRAN) 8 MG tablet Take 1 tablet (8 mg total) by mouth every 8 (eight) hours as needed for nausea or vomiting. 30 tablet 1   pantoprazole (PROTONIX) 40 MG tablet Take 1 tablet (40 mg total) by mouth daily. 30 tablet 2   zolpidem (AMBIEN) 10 MG tablet Take 1 tablet (10 mg total) by mouth at bedtime as needed for sleep. 30 tablet 0   No current facility-administered medications for this visit.    SUMMARY OF ONCOLOGIC HISTORY: Oncology History  Metastatic cancer to brain ([place])  11/27/2021 Initial Diagnosis   Metastatic cancer to brain [facility])   12/03/2021 -  Chemotherapy   Patient is on Treatment Plan : LUNG SMALL CELL Carboplatin D1 / Etoposide D1-3 q21d     Metastatic primary lung cancer, right ([place])  11/26/2021 Imaging   There is 2.9 cm lobulated noncalcified pleural-based nodule in the right upper lobe suggesting possible primary malignant neoplasm.  There are pathologically enlarged bulky lymph nodes in the mediastinum and both hilar regions, more so on the right side suggesting metastatic lymphadenopathy. There is extrinsic pressure over the right main pulmonary artery and superior vena cava by the enlarged lymph nodes in mediastinum.   Increased interstitial markings and small scattered nodules are seen in the right lung which may be part of pneumonia or pulmonary metastatic disease.   There are nodular densities in both adrenals largest measuring 3 cm in the left adrenal suggesting possible metastatic disease. There are enlarged lymph nodes in the retroperitoneum and mesentery largest measuring 5.9 cm in maximum diameter suggesting metastatic lymphadenopathy.   There are multiple cysts in the liver. There are other indeterminate low-density lesions of varying sizes in the liver. Possibility of hepatic metastatic disease is not excluded.   Head of the pancreas is enlarged in size with lobulated margins. Possibility of neoplastic process in the head of the pancreas is not excluded. Follow-up PET-CT and biopsy as warranted  should be considered.   Coronary artery calcifications are seen. Other findings as described in the body of the report.   11/26/2021 - 12/05/2021 Hospital Admission   He was admitted to the hospital and found to have metastatic lung cancer   11/27/2021 Initial Diagnosis   Metastatic primary lung cancer, right ([place])   11/27/2021 Imaging   MRI HEAD IMPRESSION:   1. 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 Mild localized edema with hemorrhagic blood products about several of these lesions without significant regional mass effect or midline shift.  2. 3.2 x 6.6 x 4.8 cm enhancing extra-axial mass at the posterior aspect of the superior sagittal sinus, indeterminate. While this finding could reflect an additional dural-based metastasis, a possible concomitant  meningioma could also be considered.   MRV HEAD IMPRESSION:   1. Invasion and obliteration of the posterior superior sagittal sinus by the above described dural based mass. Small amount of additional nonocclusive thrombus within the superior sagittal sinus anterior to the mass as above. 2. Remainder of the major dural sinuses are otherwise patent.     11/28/2021 Pathology Results   A. PERITONEAL MASS, UPPER ABDOMINAL MIDLINE, BIOPSY:  High-grade neuroendocrine carcinoma, small cell type (Small cell  carcinoma).  Please see comment.   The following immunostains are performed on block A2 with appropriate  controls:  CK7: Negative.  TTF-1: Positive.  Chromogranin: Negative.  Synoptophysin: Negative.  CD56: Positive.  Ki-67: Very high proliferative index (more than 90%).   Comment: Morphologic features of the neoplasm are most consistent with metastasis from a primary small cell carcinoma in the lung.    12/03/2021 -  Chemotherapy   Patient is on Treatment Plan : LUNG SMALL CELL Carboplatin D1 / Etoposide D1-3 q21d     12/03/2021 -  Chemotherapy   Patient is on Treatment Plan :  LUNG SMALL CELL Carboplatin D1 / Etoposide D1-3 q21d     12/09/2021 Cancer Staging   Staging form: Lung, AJCC 8th Edition - Clinical: Stage IV (cT4, cN3, pM1) - Signed by [name], MD on 12/09/2021      PHYSICAL EXAMINATION: ECOG PERFORMANCE STATUS: 2 - Symptomatic, <50% confined to bed  Vitals:   12/25/21 1126  BP: 122/63  Pulse: (!) 116  Resp: 18  Temp: 97.7 F (36.5 C)  SpO2: 97%   Filed Weights   12/25/21 1126  Weight: 191 lb 6.4 oz (86.8 kg)    GENERAL:alert, no distress and comfortable SKIN: skin color, texture, turgor are normal, no rashes or significant lesions.  Noted skin bruises NEURO: alert & oriented x 3 with fluent speech, no focal motor/sensory deficits.  Noted muscle wasting  LABORATORY DATA:  I have reviewed the data as listed    Component Value Date/Time   NA  136 12/25/2021 1107   K 3.9 12/25/2021 1107   CL 105 12/25/2021 1107   CO2 23 12/25/2021 1107   GLUCOSE 293 (H) 12/25/2021 1107   BUN 30 (H) 12/25/2021 1107   CREATININE 0.56 (L) 12/25/2021 1107   CREATININE 0.55 (L) 12/19/2021 1426   CALCIUM 8.7 (L) 12/25/2021 1107   PROT 6.8 12/25/2021 1107   ALBUMIN 3.4 (L) 12/25/2021 1107   AST 16 12/25/2021 1107   AST 16 12/19/2021 1426   ALT 56 (H) 12/25/2021 1107   ALT 48 (H) 12/19/2021 1426   ALKPHOS 97 12/25/2021 1107   BILITOT 0.5 12/25/2021 1107   BILITOT 0.5 12/19/2021 1426   GFRNONAA >60 12/25/2021 1107  GFRNONAA >60 12/19/2021 1426   GFRAA >60 01/01/2019 0726    No results found for: SPEP, UPEP  Lab Results  Component Value Date   WBC 13.2 (H) 12/25/2021   NEUTROABS 11.1 (H) 12/25/2021   HGB 13.2 12/25/2021   HCT 38.2 (L) 12/25/2021   MCV 90.1 12/25/2021   PLT 300 12/25/2021      Chemistry      Component Value Date/Time   NA 136 12/25/2021 1107   K 3.9 12/25/2021 1107   CL 105 12/25/2021 1107   CO2 23 12/25/2021 1107   BUN 30 (H) 12/25/2021 1107   CREATININE 0.56 (L) 12/25/2021 1107   CREATININE 0.55 (L) 12/19/2021 1426      Component Value Date/Time   CALCIUM 8.7 (L) 12/25/2021 1107   ALKPHOS 97 12/25/2021 1107   AST 16 12/25/2021 1107   AST 16 12/19/2021 1426   ALT 56 (H) 12/25/2021 1107   ALT 48 (H) 12/19/2021 1426   BILITOT 0.5 12/25/2021 1107   BILITOT 0.5 12/19/2021 1426       RADIOGRAPHIC STUDIES: I have personally reviewed the radiological images as listed and agreed with the findings in the report. CT Head Wo Contrast  Result Date: 11/26/2021 CLINICAL DATA:  Dizziness and disequilibrium. Symptoms for 1 month. COVID 3 weeks ago. Prostate cancer with new lung mass. EXAM: CT HEAD WITHOUT CONTRAST TECHNIQUE: Contiguous axial images were obtained from the base of the skull through the vertex without intravenous contrast. COMPARISON:  None. FINDINGS: Brain: A midline extra-axial hyperdense mass lesion  surrounds the superior sagittal sinus. The lesion measures 5.9 x 3.5 x 4.4 cm. An area of hypoattenuation is present in the posterior right frontal lobe adjacent to the lesion. There is mass effect on the posterior frontal and parietal lobes bilaterally. Separate hyperdense focus is present in the cortical or subcortical left parietal lobe on axial image 24 of series 5. Similar subcortical hyperdensity is present in the left occipital lobe on axial image 17 of series 5. Areas of hypoattenuation are present in the cerebellum. The largest is in midline with some surrounding hyperdensity, also concerning for a metastatic lesion. It measures 2.2 x 2.1 cm on saddle sagittal image 29 of series 4. Smaller hypodense left cystic lesions the cerebellum are also concerning metastases. Infarct is considered less likely. A hypodense lesion in the right temporal lobe measures 11 mm. The ventricles are of normal size.  No significant fluid is present Vascular: No hyperdense vessel or unexpected calcification. Skull: Calvarium is intact. No focal lytic or blastic lesions are present. No significant extracranial soft tissue lesion is present. Sinuses/Orbits: Right maxillary sinus opacification is likely chronic. There is some wall thickening. Minimal air is present in the sinus. The paranasal sinuses and mastoid air cells are otherwise clear. The globes and orbits are within normal limits. IMPRESSION: 1. 5.9 x 3.5 x 4.4 cm midline extra-axial hyperdense mass lesion surrounds the superior sagittal sinus. This most likely represents a metastasis. Meningioma is considered less likely. 2. Other hyperdense lesions are present in the cortical or subcortical left parietal lobe, left occipital lobe, and right temporal lobe concerning for metastatic disease. 3. There is mass effect on the posterior frontal and parietal lobes bilaterally. 4. Recommend MRI the brain without and contrast for further evaluation of these lesions. 5. Chronic right  maxillary sinus disease. These results were called by telephone at the time of interpretation on 11/26/2021 at 1:30 pm to provider Dr. Melina Copa, who verbally acknowledged these results. Electronically Signed  By: San Morelle M.D.   On: 11/26/2021 13:46   MR Brain W and Wo Contrast  Result Date: 11/28/2021 CLINICAL DATA:  Initial evaluation for brain mass. EXAM: MRI HEAD WITHOUT AND WITH CONTRAST MRV HEAD WITHOUT CONTRAST TECHNIQUE: Multiplanar, multiecho pulse sequences of the brain and surrounding structures were obtained without and with intravenous contrast. Angiographic images of the intracranial venous structures were obtained using MRV technique without intravenous contrast. CONTRAST:  28m GADAVIST GADOBUTROL 1 MMOL/ML IV SOLN COMPARISON:  Prior CT from 11/26/2021. FINDINGS: MRI HEAD FINDINGS: Brain: Cerebral volume within normal limits. Multiple scattered intraparenchymal lesions are seen involving both cerebral hemispheres as well as the cerebellum, consistent with widespread metastatic disease. There are at least 20 lesions. For reference purposes, the largest lesion within the right cerebral hemisphere is present at the right temporal lobe in measures 1.5 x 1.2 x 0.7 cm (series 26, image 22). The largest lesion within the left cerebral hemisphere is positioned at the left occipital pole and measures 1.5 x 1.7 x 1.7 cm (series 26, image 21). Largest distinct infratentorial lesion is seen within the central cerebellum and measures 2.1 x 2.2 x 2.5 cm (series 26, image 12). Several of these lesions are somewhat cystic in appearance. Few lesions demonstrate intrinsic T1 hyperintensity and/or SWI signal, consistent with hemorrhage and/or blood products, most notable at the central cerebellum (series 11, image 11). Mild localized edema about several of these lesions without significant regional mass effect. An additional large enhancing extra-axial mass positioned at the posterior aspect of the  superior sagittal sinus measures 3.2 x 6.6 x 4.8 cm (series 28, image 13). Scattered areas of internal susceptibility artifact consistent with blood products. Mild localized vasogenic edema without significant mass effect. This lesion is somewhat indeterminate, and could reflect an additional dural-based metastasis. Concomitant meningioma could also be considered. This lesion invades and obliterates the adjacent superior sagittal sinus, better evaluated on concomitant MRV. Mild diffuse smooth pachymeningeal thickening and enhancement is likely secondary. No definite evidence for acute or subacute ischemia. Note made of a punctate focus of diffusion abnormality at the right basal ganglia, favored to reflect a small metastasis rather than infarct (series 5, image 75). No midline shift or hydrocephalus. No other extra-axial fluid collection. Pituitary gland suprasellar region within normal limits. Vascular: Major intracranial arterial vascular flow voids are maintained. Skull and upper cervical spine: Craniocervical junction within normal limits. Heterogeneous signal intensity seen within the visualized bone marrow without focal marrow replacing lesion. No scalp soft tissue abnormality. Sinuses/Orbits: Globes within normal limits. Increased CSF signal intensity seen along the optic nerve sheaths bilaterally, suggesting elevated intracranial pressures. Orbital soft tissues otherwise unremarkable. Moderate mucosal thickening within the right maxillary sinus. Paranasal sinuses are otherwise largely clear. Small right with trace left mastoid effusions. Other: None. MRV HEAD FINDINGS: The above described dural based mass again noted at the posterior aspect of the superior sagittal sinus. The mass invades and obliterates the superior sagittal sinus at this level (series 1, image 98). Tubular filling defect is seen within the superior sagittal sinus anterior to the mass, likely a small amount of nonocclusive thrombus (series  13, images 88, 102). Superior sagittal sinus otherwise patent anteriorly. Additional extension of the lesion to involve the posterior aspect of the suture superior sagittal sinus as it courses towards the torcula (series 12, image 82) the torcula itself is patent. Transverse and sigmoid sinuses are patent as are the visualized proximal internal jugular veins. Straight sinus, vein of Galen, inferior sagittal sinus,  internal cerebral veins, and basal veins of Rosenthal are patent. No abnormality about the cavernous sinus. Superior orbital veins symmetric and within normal limits. No appreciable cortical venous thrombosis. IMPRESSION: MRI HEAD IMPRESSION: 1. 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 Mild localized edema with hemorrhagic blood products about several of these lesions without significant regional mass effect or midline shift. 2. 3.2 x 6.6 x 4.8 cm enhancing extra-axial mass at the posterior aspect of the superior sagittal sinus, indeterminate. While this finding could reflect an additional dural-based metastasis, a possible concomitant meningioma could also be considered. MRV HEAD IMPRESSION: 1. Invasion and obliteration of the posterior superior sagittal sinus by the above described dural based mass. Small amount of additional nonocclusive thrombus within the superior sagittal sinus anterior to the mass as above. 2. Remainder of the major dural sinuses are otherwise patent. Electronically Signed   By: Jeannine Boga M.D.   On: 11/28/2021 00:09   MR Venogram Head  Result Date: 11/28/2021 CLINICAL DATA:  Initial evaluation for brain mass. EXAM: MRI HEAD WITHOUT AND WITH CONTRAST MRV HEAD WITHOUT CONTRAST TECHNIQUE: Multiplanar, multiecho pulse sequences of the brain and surrounding structures were obtained without and with intravenous contrast. Angiographic images of the intracranial venous structures were obtained using MRV technique  without intravenous contrast. CONTRAST:  30m GADAVIST GADOBUTROL 1 MMOL/ML IV SOLN COMPARISON:  Prior CT from 11/26/2021. FINDINGS: MRI HEAD FINDINGS: Brain: Cerebral volume within normal limits. Multiple scattered intraparenchymal lesions are seen involving both cerebral hemispheres as well as the cerebellum, consistent with widespread metastatic disease. There are at least 20 lesions. For reference purposes, the largest lesion within the right cerebral hemisphere is present at the right temporal lobe in measures 1.5 x 1.2 x 0.7 cm (series 26, image 22). The largest lesion within the left cerebral hemisphere is positioned at the left occipital pole and measures 1.5 x 1.7 x 1.7 cm (series 26, image 21). Largest distinct infratentorial lesion is seen within the central cerebellum and measures 2.1 x 2.2 x 2.5 cm (series 26, image 12). Several of these lesions are somewhat cystic in appearance. Few lesions demonstrate intrinsic T1 hyperintensity and/or SWI signal, consistent with hemorrhage and/or blood products, most notable at the central cerebellum (series 11, image 11). Mild localized edema about several of these lesions without significant regional mass effect. An additional large enhancing extra-axial mass positioned at the posterior aspect of the superior sagittal sinus measures 3.2 x 6.6 x 4.8 cm (series 28, image 13). Scattered areas of internal susceptibility artifact consistent with blood products. Mild localized vasogenic edema without significant mass effect. This lesion is somewhat indeterminate, and could reflect an additional dural-based metastasis. Concomitant meningioma could also be considered. This lesion invades and obliterates the adjacent superior sagittal sinus, better evaluated on concomitant MRV. Mild diffuse smooth pachymeningeal thickening and enhancement is likely secondary. No definite evidence for acute or subacute ischemia. Note made of a punctate focus of diffusion abnormality at the  right basal ganglia, favored to reflect a small metastasis rather than infarct (series 5, image 75). No midline shift or hydrocephalus. No other extra-axial fluid collection. Pituitary gland suprasellar region within normal limits. Vascular: Major intracranial arterial vascular flow voids are maintained. Skull and upper cervical spine: Craniocervical junction within normal limits. Heterogeneous signal intensity seen within the visualized bone marrow without focal marrow replacing lesion. No scalp soft tissue abnormality. Sinuses/Orbits: Globes within normal limits. Increased CSF signal intensity seen along the optic nerve sheaths bilaterally, suggesting elevated  intracranial pressures. Orbital soft tissues otherwise unremarkable. Moderate mucosal thickening within the right maxillary sinus. Paranasal sinuses are otherwise largely clear. Small right with trace left mastoid effusions. Other: None. MRV HEAD FINDINGS: The above described dural based mass again noted at the posterior aspect of the superior sagittal sinus. The mass invades and obliterates the superior sagittal sinus at this level (series 1, image 98). Tubular filling defect is seen within the superior sagittal sinus anterior to the mass, likely a small amount of nonocclusive thrombus (series 13, images 88, 102). Superior sagittal sinus otherwise patent anteriorly. Additional extension of the lesion to involve the posterior aspect of the suture superior sagittal sinus as it courses towards the torcula (series 12, image 82) the torcula itself is patent. Transverse and sigmoid sinuses are patent as are the visualized proximal internal jugular veins. Straight sinus, vein of Galen, inferior sagittal sinus, internal cerebral veins, and basal veins of Rosenthal are patent. No abnormality about the cavernous sinus. Superior orbital veins symmetric and within normal limits. No appreciable cortical venous thrombosis. IMPRESSION: MRI HEAD IMPRESSION: 1. Widespread  intracranial metastatic disease involving both cerebral hemispheres and cerebellum as above. Mild localized edema with hemorrhagic blood products about several of these lesions without significant regional mass effect or midline shift. 2. 3.2 x 6.6 x 4.8 cm enhancing extra-axial mass at the posterior aspect of the superior sagittal sinus, indeterminate. While this finding could reflect an additional dural-based metastasis, a possible concomitant meningioma could also be considered. MRV HEAD IMPRESSION: 1. Invasion and obliteration of the posterior superior sagittal sinus by the above described dural based mass. Small amount of additional nonocclusive thrombus within the superior sagittal sinus anterior to the mass as above. 2. Remainder of the major dural sinuses are otherwise patent. Electronically Signed   By: Jeannine Boga M.D.   On: 11/28/2021 00:09   CT CHEST ABDOMEN PELVIS W CONTRAST  Result Date: 11/26/2021 CLINICAL DATA:  Persistent cough, shortness of breath EXAM: CT CHEST, ABDOMEN, AND PELVIS WITH CONTRAST TECHNIQUE: Multidetector CT imaging of the chest, abdomen and pelvis was performed following the standard protocol during bolus administration of intravenous contrast. CONTRAST:  177m OMNIPAQUE IOHEXOL 300 MG/ML  SOLN COMPARISON:  Previous studies including chest radiographs done on 11/24/2021 FINDINGS: CT CHEST FINDINGS Cardiovascular: Coronary artery calcifications are seen. There is homogeneous enhancement in thoracic aorta. There are no intraluminal filling defects in central pulmonary artery branches. There is extrinsic compression of right main pulmonary artery caused by pathologically enlarged lymph nodes in mediastinum and right hilum. There is extrinsic pressure over the superior vena cava caused by lymphadenopathy. Mediastinum/Nodes: There is bulky lymphadenopathy in the mediastinum extending to the right hilum measuring approximately 8.6 x 8.6 x 9.5 cm. There is 2.6 x 1.9 cm node  in the left hilum. Lungs/Pleura: Centrilobular emphysema is seen. In image 54 of series 4, there is 2.9 x 2.4 cm pleural-based noncalcified nodule in the right upper lobe. There are increased interstitial markings in the right upper lobe. In the image 71, there is 12 mm nodular density in the right upper lobe. In the image 73, there is 6 mm nodule in the right upper lobe. There are 3 small nodular densities each measuring less than 9 mm in size in the right lower lobe. Musculoskeletal: Unremarkable. CT ABDOMEN PELVIS FINDINGS Hepatobiliary: Liver measures 17.5 cm in length. In the image 58 of series 2, there is 1.6 cm fluid density lesion in the left lobe, possibly cyst. There are other scattered low-density foci  in the right lobe some of which may be cysts. Possibility of space-occupying neoplastic process is not excluded. There are slightly enlarged lymph nodes anterior to the liver and adjacent to the pericardium largest measuring 2.7 x 1.7 cm. There is no dilation of bile ducts. Gallbladder is not distended. Pancreas: Head of the pancreas is prominent in size. There is no dilation of pancreatic duct. No focal abnormality is seen in the body and tail of pancreas. Spleen: Spleen is not enlarged. Adrenals/Urinary Tract: There is 3 cm nodule in the left adrenal. There is 2 cm nodule in the right adrenal. There is no hydronephrosis. There are no renal or ureteral stones. There are smooth marginated nodular densities adjacent to the kidneys largest measuring 3.6 cm posterior to the upper pole of right kidney. Density measurements in this lesions are higher than usual for simple cysts. Ureters not dilated. Urinary bladder is not distended. Stomach/Bowel: Stomach is unremarkable. Small bowel loops are not dilated. Appendix is not dilated. There is no significant wall thickening in colon. Vascular/Lymphatic: There are scattered atherosclerotic plaques and calcifications in the aorta and its major branches. There are  pathologically enlarged lymph nodes in the mesentery and retroperitoneum. Largest of these nodes is noted anterior to the transverse colon measuring 5.9 x 3 cm. There are multiple other smaller nodules of varying sizes in the abdomen in the mesentery and retroperitoneum. Reproductive: Prostate is not enlarged. There is fluid density in the the region of prostatic urethra. This may suggest previous prostate surgery. Other: In image 76 of series 2, there is a 12 mm subcutaneous nodule in the right anterior abdominal wall. Small bilateral inguinal hernias containing fat are seen. Musculoskeletal: Unremarkable. IMPRESSION: There is 2.9 cm lobulated noncalcified pleural-based nodule in the right upper lobe suggesting possible primary malignant neoplasm. There are pathologically enlarged bulky lymph nodes in the mediastinum and both hilar regions, more so on the right side suggesting metastatic lymphadenopathy. There is extrinsic pressure over the right main pulmonary artery and superior vena cava by the enlarged lymph nodes in mediastinum. Increased interstitial markings and small scattered nodules are seen in the right lung which may be part of pneumonia or pulmonary metastatic disease. There are nodular densities in both adrenals largest measuring 3 cm in the left adrenal suggesting possible metastatic disease. There are enlarged lymph nodes in the retroperitoneum and mesentery largest measuring 5.9 cm in maximum diameter suggesting metastatic lymphadenopathy. There are multiple cysts in the liver. There are other indeterminate low-density lesions of varying sizes in the liver. Possibility of hepatic metastatic disease is not excluded. Head of the pancreas is enlarged in size with lobulated margins. Possibility of neoplastic process in the head of the pancreas is not excluded. Follow-up PET-CT and biopsy as warranted should be considered. Coronary artery calcifications are seen. Other findings as described in the body  of the report. Electronically Signed   By: Elmer Picker M.D.   On: 11/26/2021 13:49   CT BIOPSY  Result Date: 11/28/2021 INDICATION: 66 year old with evidence of diffuse metastatic disease. Tissue diagnosis is needed. EXAM: CT-GUIDED BIOPSY OF ANTERIOR ABDOMINAL PERITONEAL MASS MEDICATIONS: Moderate sedation ANESTHESIA/SEDATION: Moderate (conscious) sedation was employed during this procedure. A total of Versed 2.60m and fentanyl 100 mcg was administered intravenously at the order of the provider performing the procedure. Total intra-service moderate sedation time: 14 minutes. Patient's level of consciousness and vital signs were monitored continuously by radiology nurse throughout the procedure under the supervision of the provider performing the procedure. FLUOROSCOPY TIME:  None COMPLICATIONS: None immediate. PROCEDURE: Informed written consent was obtained from the patient after a thorough discussion of the procedural risks, benefits and alternatives. All questions were addressed. A timeout was performed prior to the initiation of the procedure. Patient was placed supine on CT scanner. Images of the upper abdomen are obtained. The large mass in the anterior upper abdomen was identified and targeted. The skin was prepped with chlorhexidine and sterile field was created. Skin and soft tissues were anesthetized with 1% lidocaine. A small incision was made. Using CT guidance, 17 gauge coaxial needle was directed into the anterior abdominal mass. Multiple core biopsies were obtained with an 18 gauge core device and specimens were placed in formalin. 17 gauge needle was removed without complication. Bandage placed over the puncture site. FINDINGS: Again noted is large mass in the anterior upper abdomen adjacent to the abdominal wall. This structure measures up to 5.1 cm. Needle position was confirmed within the lesion. Specimens placed in formalin. IMPRESSION: CT-guided core biopsies of the anterior upper  abdominal mass. Electronically Signed   By: Markus Daft M.D.   On: 11/28/2021 12:50   DG CHEST PORT 1 VIEW  Result Date: 11/30/2021 CLINICAL DATA:  Cough, prostate cancer, COVID EXAM: PORTABLE CHEST 1 VIEW COMPARISON:  None. FINDINGS: Right Port-A-Cath tip at the cavoatrial junction. Heart is normal size. Left lung clear. Right hilar and mediastinal adenopathy again noted. Peripheral right upper lobe nodule/mass again noted. No effusions. No acute bony abnormality. IMPRESSION: Right upper lobe mass with right hilar and mediastinal adenopathy. Electronically Signed   By: Rolm Baptise M.D.   On: 11/30/2021 17:32   IR IMAGING GUIDED PORT INSERTION  Result Date: 11/28/2021 INDICATION: 66 year old with evidence of metastatic disease. Port-A-Cath needed for therapy. EXAM: FLUOROSCOPIC AND ULTRASOUND GUIDED PLACEMENT OF A SUBCUTANEOUS PORT COMPARISON:  None. MEDICATIONS: Moderate sedation ANESTHESIA/SEDATION: Versed 2.0 mg IV; Fentanyl 100 mcg IV; Moderate Sedation Time:  44 minutes The patient was continuously monitored during the procedure by the interventional radiology nurse under my direct supervision. FLUOROSCOPY TIME:  24 seconds, 1 mGy COMPLICATIONS: None immediate. PROCEDURE: The procedure, risks, benefits, and alternatives were explained to the patient. Questions regarding the procedure were encouraged and answered. The patient understands and consents to the procedure. Patient was placed supine on the interventional table. Ultrasound confirmed a patent right internal jugular vein. Ultrasound image was saved for documentation. The right chest and neck were cleaned with a skin antiseptic and a sterile drape was placed. Maximal barrier sterile technique was utilized including caps, mask, sterile gowns, sterile gloves, sterile drape, hand hygiene and skin antiseptic. The right neck was anesthetized with 1% lidocaine. Small incision was made in the right neck with a blade. Micropuncture set was placed in the  right internal jugular vein with ultrasound guidance. The micropuncture wire was used for measurement purposes. The right chest was anesthetized with 1% lidocaine with epinephrine. #15 blade was used to make an incision and a subcutaneous port pocket was formed. [place] was assembled. Subcutaneous tunnel was formed with a stiff tunneling device. The port catheter was brought through the subcutaneous tunnel. The port was placed in the subcutaneous pocket. The micropuncture set was exchanged for a peel-away sheath. The catheter was placed through the peel-away sheath and the tip was positioned at the superior cavoatrial junction. Catheter placement was confirmed with fluoroscopy. The port was accessed and flushed with heparinized saline. The port pocket was closed using two layers of absorbable sutures and Dermabond. The  vein skin site was closed using a single layer of absorbable suture and Dermabond. Sterile dressings were applied. Patient tolerated the procedure well without an immediate complication. Ultrasound and fluoroscopic images were taken and saved for this procedure. IMPRESSION: Placement of a subcutaneous power-injectable port device. Catheter tip at the superior cavoatrial junction. Electronically Signed   By: Markus Daft M.D.   On: 11/28/2021 17:48

## 2021-12-26 NOTE — Assessment & Plan Note (Signed)
He is tachycardic and somewhat hypotensive due to poor oral intake I encouraged the patient to increase oral intake as tolerated

## 2021-12-26 NOTE — Telephone Encounter (Signed)
Called referral for home health to [facility], spoke with Wylene Men nurse with [facility]. She can see referral in Epic and will contact significant other, [name].

## 2021-12-26 NOTE — Assessment & Plan Note (Signed)
I have received communication from physical therapist who felt that outpatient therapy would be impossible for him due to his profound weakness I will try to consult home health and home physical therapy

## 2021-12-26 NOTE — Telephone Encounter (Signed)
Scheduled appointment per 1/5 los. Called to update patient on the changes made to his appointment. Left message. Patient will be mailed updated calendar.

## 2021-12-26 NOTE — Assessment & Plan Note (Addendum)
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

## 2021-12-26 NOTE — Assessment & Plan Note (Signed)
Clinically, his labs has improved dramatically We will proceed with treatment next week as scheduled After that, I plan to order PET/CT imaging for objective assessment of response to treatment The patient need additional aggressive supportive measures I will try to address his needs on a weekly basis

## 2021-12-26 NOTE — Assessment & Plan Note (Signed)
This is difficult to treat as he has inconsistent diet and have difficulties tolerating normal food I will monitor it closely For now, I will hold off prescribing metformin or insulin due to risk of hypoglycemia

## 2021-12-26 NOTE — Assessment & Plan Note (Signed)
This is improving I recommend home health nurse to check on him

## 2021-12-29 ENCOUNTER — Inpatient Hospital Stay: Payer: BC Managed Care – PPO | Admitting: *Deleted

## 2021-12-29 ENCOUNTER — Other Ambulatory Visit: Payer: Self-pay

## 2021-12-29 ENCOUNTER — Inpatient Hospital Stay: Payer: BC Managed Care – PPO

## 2021-12-29 ENCOUNTER — Encounter: Payer: Self-pay | Admitting: General Practice

## 2021-12-29 ENCOUNTER — Inpatient Hospital Stay: Payer: BC Managed Care – PPO | Admitting: Nutrition

## 2021-12-29 VITALS — BP 125/83 | HR 114 | Temp 98.4°F | Resp 24 | Wt 189.5 lb

## 2021-12-29 DIAGNOSIS — C3491 Malignant neoplasm of unspecified part of right bronchus or lung: Secondary | ICD-10-CM

## 2021-12-29 DIAGNOSIS — C7931 Secondary malignant neoplasm of brain: Secondary | ICD-10-CM

## 2021-12-29 MED ORDER — SODIUM CHLORIDE 0.9 % IV SOLN: Freq: Once | INTRAVENOUS | Status: AC

## 2021-12-29 MED ORDER — LOPERAMIDE HCL 2 MG PO CAPS
4.0000 mg | ORAL_CAPSULE | ORAL | Status: DC | PRN
  Administered 2021-12-29: 4 mg via ORAL
  Filled 2021-12-29: qty 2

## 2021-12-29 MED ORDER — HEPARIN SOD (PORK) LOCK FLUSH 100 UNIT/ML IV SOLN
500.0000 [IU] | Freq: Once | INTRAVENOUS | Status: AC | PRN
  Administered 2021-12-29: 500 [IU]

## 2021-12-29 MED ORDER — SODIUM CHLORIDE 0.9 % IV SOLN
100.0000 mg/m2 | Freq: Once | INTRAVENOUS | Status: AC
  Administered 2021-12-29: 210 mg via INTRAVENOUS
  Filled 2021-12-29: qty 10.5

## 2021-12-29 MED ORDER — SODIUM CHLORIDE 0.9 % IV SOLN
150.0000 mg | Freq: Once | INTRAVENOUS | Status: AC
  Administered 2021-12-29: 150 mg via INTRAVENOUS
  Filled 2021-12-29: qty 150

## 2021-12-29 MED ORDER — PALONOSETRON HCL INJECTION 0.25 MG/5ML
0.2500 mg | Freq: Once | INTRAVENOUS | Status: AC
  Administered 2021-12-29: 0.25 mg via INTRAVENOUS
  Filled 2021-12-29: qty 5

## 2021-12-29 MED ORDER — SODIUM CHLORIDE 0.9 % IV SOLN
10.0000 mg | Freq: Once | INTRAVENOUS | Status: AC
  Administered 2021-12-29: 10 mg via INTRAVENOUS
  Filled 2021-12-29: qty 10

## 2021-12-29 MED ORDER — SODIUM CHLORIDE 0.9% FLUSH
10.0000 mL | INTRAVENOUS | Status: DC | PRN
  Administered 2021-12-29: 10 mL

## 2021-12-29 MED ORDER — SODIUM CHLORIDE 0.9 % IV SOLN
565.0000 mg | Freq: Once | INTRAVENOUS | Status: AC
  Administered 2021-12-29: 570 mg via INTRAVENOUS
  Filled 2021-12-29: qty 57

## 2021-12-29 MED FILL — Dexamethasone Sodium Phosphate Inj 100 MG/10ML: INTRAMUSCULAR | Qty: 1 | Status: AC

## 2021-12-29 NOTE — Progress Notes (Signed)
Per Dr. Alvy Bimler ok to treat with elevated HR

## 2021-12-29 NOTE — Progress Notes (Signed)
Nutrition follow-up completed with patient during infusion.  I also spoke with patient's wife after speaking with patient.  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  Status postradiation therapy on December 27.  Weight decreased and documented as 189.5 pounds January 9.  This is decreased from 218 pounds December 23. Patient denies skin issues and reports all of his "wounds/decubitus ulcers" have healed. Reports taste alterations.  He has not been using baking soda and salt water rinses. Patient is drinking 2 Ensure Plus daily.  In addition patient consumes 3 milkshakes made from [employer] and powdered milk.  He is eating very small amounts of other foods.  Per wife he takes bites.  Labs were reviewed.  Nutrition diagnosis: Food and nutrition related knowledge deficit continues.  Intervention: Educated increase Ensure Plus high-protein 3 times daily and continue homemade milkshakes 3 times daily.   Stressed importance of trying to eat small amounts of food throughout the day.  Reviewed snack options. Encouraged increased fluid intake. Provided additional nutrition facts sheets.  Questions were answered.  Teach back method used.  Coupons given.  Monitoring, evaluation, goals: Patient will tolerate increased calories and protein to minimize weight loss.  Next visit: Monday, January 30 during infusion.  **Disclaimer: This note was dictated with voice recognition software. Similar sounding words can inadvertently be transcribed and this note may contain transcription errors which may not have been corrected upon publication of note.**

## 2021-12-29 NOTE — Progress Notes (Signed)
[facility] Note  Spoke with Ryan [name] in infusion about need to reschedule today's [facility] appointment due to conflict with chemo treatment. Provided date of next clinic and contact information to register. He expressed concern that his need may be more pressing; encouraged him to contact me directly if he would like to schedule a special appointment outside of clinic. (Explained that process includes two staff and two volunteers to complete, so we need lead time to coordinate availability.) He verbalized frustration and understanding.   [place], [place], [facility] Pager [phone] Voicemail [phone]

## 2021-12-29 NOTE — Patient Instructions (Signed)
[facility] ONCOLOGY  Discharge Instructions: Thank you for choosing [facility] to provide your oncology and hematology care.   If you have a lab appointment with the [facility], please go directly to the [facility] and check in at the registration area.   Wear comfortable clothing and clothing appropriate for easy access to any Portacath or PICC line.   We strive to give you quality time with your provider. You may need to reschedule your appointment if you arrive late (15 or more minutes).  Arriving late affects you and other patients whose appointments are after yours.  Also, if you miss three or more appointments without notifying the office, you may be dismissed from the clinic at the providers discretion.      For prescription refill requests, have your pharmacy contact our office and allow 72 hours for refills to be completed.    Today you received the following chemotherapy and/or immunotherapy agents carboplatin, etoposide      To help prevent nausea and vomiting after your treatment, we encourage you to take your nausea medication as directed.  BELOW ARE SYMPTOMS THAT SHOULD BE REPORTED IMMEDIATELY: *FEVER GREATER THAN 100.4 F (38 C) OR HIGHER *CHILLS OR SWEATING *NAUSEA AND VOMITING THAT IS NOT CONTROLLED WITH YOUR NAUSEA MEDICATION *UNUSUAL SHORTNESS OF BREATH *UNUSUAL BRUISING OR BLEEDING *URINARY PROBLEMS (pain or burning when urinating, or frequent urination) *BOWEL PROBLEMS (unusual diarrhea, constipation, pain near the anus) TENDERNESS IN MOUTH AND THROAT WITH OR WITHOUT PRESENCE OF ULCERS (sore throat, sores in mouth, or a toothache) UNUSUAL RASH, SWELLING OR PAIN  UNUSUAL VAGINAL DISCHARGE OR ITCHING   Items with * indicate a potential emergency and should be followed up as soon as possible or go to the Emergency Department if any problems should occur.  Please show the CHEMOTHERAPY ALERT CARD or IMMUNOTHERAPY ALERT CARD at  check-in to the Emergency Department and triage nurse.  Should you have questions after your visit or need to cancel or reschedule your appointment, please contact [facility]  Dept: [phone]  and follow the prompts.  Office hours are 8:00 a.m. to 4:30 p.m. Monday - Friday. Please note that voicemails left after 4:00 p.m. may not be returned until the following business day.  We are closed weekends and major holidays. You have access to a nurse at all times for urgent questions. Please call the main number to the clinic Dept: [phone] and follow the prompts.   For any non-urgent questions, you may also contact your provider using MyChart. We now offer e-Visits for anyone 53 and older to request care online for non-urgent symptoms. For details visit [email or web address].   Also download the MyChart app! Go to the app store, search "MyChart", open the app, select [email or web address], and log in with your MyChart username and password.  Due to Covid, a mask is required upon entering the hospital/clinic. If you do not have a mask, one will be given to you upon arrival. For doctor visits, patients may have 1 support person aged 66 or older with them. For treatment visits, patients cannot have anyone with them due to current Covid guidelines and our immunocompromised population.

## 2021-12-30 ENCOUNTER — Inpatient Hospital Stay: Payer: BC Managed Care – PPO

## 2021-12-30 VITALS — BP 126/82 | HR 94 | Temp 98.3°F | Resp 28 | Ht 74.0 in

## 2021-12-30 DIAGNOSIS — C7931 Secondary malignant neoplasm of brain: Secondary | ICD-10-CM

## 2021-12-30 DIAGNOSIS — C3491 Malignant neoplasm of unspecified part of right bronchus or lung: Secondary | ICD-10-CM | POA: Diagnosis not present

## 2021-12-30 MED ORDER — HEPARIN SOD (PORK) LOCK FLUSH 100 UNIT/ML IV SOLN
500.0000 [IU] | Freq: Once | INTRAVENOUS | Status: AC | PRN
  Administered 2021-12-30: 500 [IU]

## 2021-12-30 MED ORDER — SODIUM CHLORIDE 0.9 % IV SOLN
100.0000 mg/m2 | Freq: Once | INTRAVENOUS | Status: AC
  Administered 2021-12-30: 210 mg via INTRAVENOUS
  Filled 2021-12-30: qty 10.5

## 2021-12-30 MED ORDER — SODIUM CHLORIDE 0.9 % IV SOLN
10.0000 mg | Freq: Once | INTRAVENOUS | Status: AC
  Administered 2021-12-30: 10 mg via INTRAVENOUS
  Filled 2021-12-30: qty 10

## 2021-12-30 MED ORDER — SODIUM CHLORIDE 0.9 % IV SOLN: Freq: Once | INTRAVENOUS | Status: AC

## 2021-12-30 MED ORDER — SODIUM CHLORIDE 0.9% FLUSH
10.0000 mL | INTRAVENOUS | Status: DC | PRN
  Administered 2021-12-30: 10 mL

## 2021-12-30 MED FILL — Dexamethasone Sodium Phosphate Inj 100 MG/10ML: INTRAMUSCULAR | Qty: 1 | Status: AC

## 2021-12-30 NOTE — Patient Instructions (Signed)
[facility] ONCOLOG[facility]  Discharge Instructions: Thank you for choosing [facility] to provide your oncology and hematology care.   If you have a lab appointment with the [facility], please go directly to the [facility] and check in at the registration area.   Wear comfortable clothing and clothing appropriate for easy access to any Portacath or PICC line.   We strive to give you quality time with your provider. You may need to reschedule your appointment if you arrive late (15 or more minutes).  Arriving late affects you and other patients whose appointments are after yours.  Also, if you miss three or more appointments without notifying the office, you may be dismissed from the clinic at the providers discretion.      For prescription refill requests, have your pharmacy contact our office and allow 72 hours for refills to be completed.    Today you received the following chemotherapy and/or immunotherapy agent: Etoposide   To help prevent nausea and vomiting after your treatment, we encourage you to take your nausea medication as directed.  BELOW ARE SYMPTOMS THAT SHOULD BE REPORTED IMMEDIATELY: *FEVER GREATER THAN 100.4 F (38 C) OR HIGHER *CHILLS OR SWEATING *NAUSEA AND VOMITING THAT IS NOT CONTROLLED WITH YOUR NAUSEA MEDICATION *UNUSUAL SHORTNESS OF BREATH *UNUSUAL BRUISING OR BLEEDING *URINARY PROBLEMS (pain or burning when urinating, or frequent urination) *BOWEL PROBLEMS (unusual diarrhea, constipation, pain near the anus) TENDERNESS IN MOUTH AND THROAT WITH OR WITHOUT PRESENCE OF ULCERS (sore throat, sores in mouth, or a toothache) UNUSUAL RASH, SWELLING OR PAIN  UNUSUAL VAGINAL DISCHARGE OR ITCHING   Items with * indicate a potential emergency and should be followed up as soon as possible or go to the Emergency Department if any problems should occur.  Please show the CHEMOTHERAPY ALERT CARD or IMMUNOTHERAPY ALERT CARD at check-in to the  Emergency Department and triage nurse.  Should you have questions after your visit or need to cancel or reschedule your appointment, please contact [facility]  Dept: [phone]  and follow the prompts.  Office hours are 8:00 a.m. to 4:30 p.m. Monday - Friday. Please note that voicemails left after 4:00 p.m. may not be returned until the following business day.  We are closed weekends and major holidays. You have access to a nurse at all times for urgent questions. Please call the main number to the clinic Dept: [phone] and follow the prompts.   For any non-urgent questions, you may also contact your provider using MyChart. We now offer e-Visits for anyone 15 and older to request care online for non-urgent symptoms. For details visit [email or web address].   Also download the MyChart app! Go to the app store, search "MyChart", open the app, select , and log in with your MyChart username and password.  Due to Covid, a mask is required upon entering the hospital/clinic. If you do not have a mask, one will be given to you upon arrival. For doctor visits, patients may have 1 support person aged 68 or older with them. For treatment visits, patients cannot have anyone with them due to current Covid guidelines and our immunocompromised population.

## 2021-12-31 ENCOUNTER — Telehealth: Payer: Self-pay

## 2021-12-31 ENCOUNTER — Inpatient Hospital Stay: Payer: BC Managed Care – PPO

## 2021-12-31 ENCOUNTER — Other Ambulatory Visit: Payer: Self-pay

## 2021-12-31 VITALS — BP 126/77 | HR 89 | Temp 98.0°F | Resp 19

## 2021-12-31 DIAGNOSIS — C3491 Malignant neoplasm of unspecified part of right bronchus or lung: Secondary | ICD-10-CM

## 2021-12-31 DIAGNOSIS — C7931 Secondary malignant neoplasm of brain: Secondary | ICD-10-CM

## 2021-12-31 MED ORDER — SODIUM CHLORIDE 0.9 % IV SOLN: Freq: Once | INTRAVENOUS | Status: AC

## 2021-12-31 MED ORDER — SODIUM CHLORIDE 0.9 % IV SOLN
100.0000 mg/m2 | Freq: Once | INTRAVENOUS | Status: AC
  Administered 2021-12-31: 210 mg via INTRAVENOUS
  Filled 2021-12-31: qty 10.5

## 2021-12-31 MED ORDER — HEPARIN SOD (PORK) LOCK FLUSH 100 UNIT/ML IV SOLN: 500.0000 [IU] | Freq: Once | INTRAVENOUS | Status: DC | PRN

## 2021-12-31 MED ORDER — SODIUM CHLORIDE 0.9% FLUSH: 10.0000 mL | INTRAVENOUS | Status: DC | PRN

## 2021-12-31 MED ORDER — SODIUM CHLORIDE 0.9 % IV SOLN
10.0000 mg | Freq: Once | INTRAVENOUS | Status: AC
  Administered 2021-12-31: 10 mg via INTRAVENOUS
  Filled 2021-12-31: qty 10

## 2021-12-31 NOTE — Telephone Encounter (Signed)
Returned call to [facility]. She is concerned that home health wants to come in for nursing in addition to PT. Left a message asking her to call the office back.

## 2021-12-31 NOTE — Patient Instructions (Signed)
[facility]ICAL [facility]COL[facility]  Discharge Instructions: Thank you for choosing [facility] to provide your oncology and hematology care.   If you have a lab appointment with the Cancer Center, please go directly to the Cancer Center and check in at the registration area.   Wear comfortable clothing and clothing appropriate for easy access to any Portacath or PICC line.   We strive to give you quality time with your provider. You may need to reschedule your appointment if you arrive late (15 or more minutes).  Arriving late affects you and other patients whose appointments are after yours.  Also, if you miss three or more appointments without notifying the office, you may be dismissed from the clinic at the provider's discretion.      For prescription refill requests, have your pharmacy contact our office and allow 72 hours for refills to be completed.    Today you received the following chemotherapy and/or immunotherapy agents: etoposide.     To help prevent nausea and vomiting after your treatment, we encourage you to take your nausea medication as directed.  BELOW ARE SYMPTOMS THAT SHOULD BE REPORTED IMMEDIATELY: . *FEVER GREATER THAN 100.4 F (38 C) OR HIGHER . *CHILLS OR SWEATING . *NAUSEA AND VOMITING THAT IS NOT CONTROLLED WITH YOUR NAUSEA MEDICATION . *UNUSUAL SHORTNESS OF BREATH . *UNUSUAL BRUISING OR BLEEDING . *URINARY PROBLEMS (pain or burning when urinating, or frequent urination) . *BOWEL PROBLEMS (unusual diarrhea, constipation, pain near the anus) . TENDERNESS IN MOUTH AND THROAT WITH OR WITHOUT PRESENCE OF ULCERS (sore throat, sores in mouth, or a toothache) . UNUSUAL RASH, SWELLING OR PAIN  . UNUSUAL VAGINAL DISCHARGE OR ITCHING   Items with * indicate a potential emergency and should be followed up as soon as possible or go to the Emergency Department if any problems should occur.  Please show the CHEMOTHERAPY ALERT CARD or IMMUNOTHERAPY ALERT  CARD at check-in to the Emergency Department and triage nurse.  Should you have questions after your visit or need to cancel or reschedule your appointment, please contact [facility]  Dept: [phone]  and follow the prompts.  Office hours are 8:00 a.m. to 4:30 p.m. Monday - Friday. Please note that voicemails left after 4:00 p.m. may not be returned until the following business day.  We are closed weekends and major holidays. You have access to a nurse at all times for urgent questions. Please call the main number to the clinic Dept: [phone] and follow the prompts.   For any non-urgent questions, you may also contact your provider using MyChart. We now offer e-Visits for anyone 18 and older to request care online for non-urgent symptoms. For details visit [email or web address].   Also download the MyChart app! Go to the app store, search "MyChart", open the app, select [facility], and log in with your MyChart username and password.  Due to Covid, a mask is required upon entering the hospital/clinic. If you do not have a mask, one will be given to you upon arrival. For doctor visits, patients may have 1 support person aged 18 or older with them. For treatment visits, patients cannot have anyone with them due to current Covid guidelines and our immunocompromised population.   

## 2021-12-31 NOTE — Telephone Encounter (Signed)
She called back. She got a call from home health. PT to start tomorrow. Then they tried to set up nursing assessment with home health. She was concerned that something had happened. Reassured her that nothing had changed and nursing is just for assessment. She verbalized understanding.

## 2022-01-01 ENCOUNTER — Telehealth: Payer: Self-pay

## 2022-01-01 NOTE — Telephone Encounter (Signed)
Returned call to PT with [facility], given verbal order for PT weekly x 6 weeks to [place].

## 2022-01-02 ENCOUNTER — Ambulatory Visit: Payer: BC Managed Care – PPO

## 2022-01-02 ENCOUNTER — Inpatient Hospital Stay: Payer: BC Managed Care – PPO | Admitting: Hematology and Oncology

## 2022-01-02 ENCOUNTER — Encounter: Payer: Self-pay | Admitting: Hematology and Oncology

## 2022-01-02 ENCOUNTER — Inpatient Hospital Stay: Payer: BC Managed Care – PPO

## 2022-01-02 ENCOUNTER — Other Ambulatory Visit: Payer: Self-pay

## 2022-01-02 DIAGNOSIS — R634 Abnormal weight loss: Secondary | ICD-10-CM | POA: Diagnosis not present

## 2022-01-02 DIAGNOSIS — C7931 Secondary malignant neoplasm of brain: Secondary | ICD-10-CM

## 2022-01-02 DIAGNOSIS — C3491 Malignant neoplasm of unspecified part of right bronchus or lung: Secondary | ICD-10-CM

## 2022-01-02 MED ORDER — PEGFILGRASTIM-CBQV 6 MG/0.6ML ~~LOC~~ SOSY
6.0000 mg | PREFILLED_SYRINGE | Freq: Once | SUBCUTANEOUS | Status: AC
  Administered 2022-01-02: 6 mg via SUBCUTANEOUS
  Filled 2022-01-02: qty 0.6

## 2022-01-02 NOTE — Assessment & Plan Note (Signed)
From previous discussion, I have recommended PET/CT imaging as soon as possible Continue supportive care

## 2022-01-02 NOTE — Progress Notes (Signed)
[facility] OFFICE PROGRESS NOTE  Patient Care Team: [name], DO as PCP - General (Internal Medicine) Valrie Hart, RN as Oncology Nurse Navigator (Oncology)  ASSESSMENT & PLAN:  Metastatic primary lung cancer, right [facility]) From previous discussion, I have recommended PET/CT imaging as soon as possible Continue supportive care  Weight loss Continues to have significant weight loss His appetite and oral intake is poor He has no mouth sores, nausea or problem with swallowing He declined eating due to lack of appetite and taste We have already placed the patient on Remeron and dexamethasone At this point in time, I continue to encourage the patient to increase oral intake as tolerated I do not recommend placement of feeding  No orders of the defined types were placed in this encounter.   All questions were answered. The patient knows to call the clinic with any problems, questions or concerns. The total time spent in the appointment was 20 minutes encounter with patients including review of chart and various tests results, discussions about plan of care and coordination of care plan   [name], MD 01/02/2022 2:12 PM  INTERVAL HISTORY: Please see below for problem oriented charting. he returns for surveillance follow-up and growth factor support with his significant other He continues to lose weight He has poor appetite and altered taste sensation in hands decline eating No recent falls Denies new decubitus ulcer  REVIEW OF SYSTEMS:   Constitutional: Denies fevers, chills or abnormal weight loss Eyes: Denies blurriness of vision Ears, nose, mouth, throat, and face: Denies mucositis or sore throat Respiratory: Denies cough, dyspnea or wheezes Cardiovascular: Denies palpitation, chest discomfort or lower extremity swelling Gastrointestinal:  Denies nausea, heartburn or change in bowel habits Skin: Denies abnormal skin rashes Lymphatics: Denies new  lymphadenopathy or easy bruising Neurological:Denies numbness, tingling or new weaknesses Behavioral/Psych: Mood is stable, no new changes  All other systems were reviewed with the patient and are negative.  I have reviewed the past medical history, past surgical history, social history and family history with the patient and they are unchanged from previous note.  ALLERGIES:  is allergic to hydralazine and percocet [oxycodone-acetaminophen].  MEDICATIONS:  Current Outpatient Medications  Medication Sig Dispense Refill   atorvastatin (LIPITOR) 20 MG tablet Take 20 mg by mouth daily.     dexamethasone (DECADRON) 1 MG tablet Take 1 tablet (1 mg total) by mouth daily. Please dispense, for taper course 30 tablet 1   dexamethasone (DECADRON) 4 MG tablet Take 0.5 tablets (2 mg total) by mouth 2 (two) times daily. 60 tablet 1   Elastic Bandages & Supports (ABDOMINAL BINDER/ELASTIC 2XL) MISC 1 Units by Does not apply route daily. 1 each 0   lidocaine-prilocaine (EMLA) cream Apply 1 application topically as needed. 30 g 3   memantine (NAMENDA) 10 MG tablet Take 1 tablet (10 mg total) by mouth 2 (two) times daily. 60 tablet 4   memantine (NAMENDA) 5 MG tablet Begin this prescription the first day of brain radiation. Week 1: take one tablet po qam. Week 2: take one tablet qam and qpm. Week 3: take two tablets qam, and one tablet po q pm. Week 4: take two tablets qam and qpm. Fill subsequent prescription q month. 70 tablet 0   mirtazapine (REMERON) 30 MG tablet Take 1 tablet (30 mg total) by mouth at bedtime. 30 tablet 1   ondansetron (ZOFRAN) 8 MG tablet Take 1 tablet (8 mg total) by mouth every 8 (eight) hours as  needed for nausea or vomiting. 30 tablet 1   pantoprazole (PROTONIX) 40 MG tablet Take 1 tablet (40 mg total) by mouth daily. 30 tablet 2   zolpidem (AMBIEN) 10 MG tablet Take 1 tablet (10 mg total) by mouth at bedtime as needed for sleep. 30 tablet 0   No current facility-administered  medications for this visit.    SUMMARY OF ONCOLOGIC HISTORY: Oncology History  Metastatic cancer to brain ([place])  11/27/2021 Initial Diagnosis   Metastatic cancer to brain [facility])   12/03/2021 -  Chemotherapy   Patient is on Treatment Plan : LUNG SMALL CELL Carboplatin D1 / Etoposide D1-3 q21d     Metastatic primary lung cancer, right ([place])  11/26/2021 Imaging   There is 2.9 cm lobulated noncalcified pleural-based nodule in the right upper lobe suggesting possible primary malignant neoplasm. There are pathologically enlarged bulky lymph nodes in the mediastinum and both hilar regions, more so on the right side suggesting metastatic lymphadenopathy. There is extrinsic pressure over the right main pulmonary artery and superior vena cava by the enlarged lymph nodes in mediastinum.   Increased interstitial markings and small scattered nodules are seen in the right lung which may be part of pneumonia or pulmonary metastatic disease.   There are nodular densities in both adrenals largest measuring 3 cm in the left adrenal suggesting possible metastatic disease. There are enlarged lymph nodes in the retroperitoneum and mesentery largest measuring 5.9 cm in maximum diameter suggesting metastatic lymphadenopathy.   There are multiple cysts in the liver. There are other indeterminate low-density lesions of varying sizes in the liver. Possibility of hepatic metastatic disease is not excluded.   Head of the pancreas is enlarged in size with lobulated margins. Possibility of neoplastic process in the head of the pancreas is not excluded. Follow-up PET-CT and biopsy as warranted should be considered.   Coronary artery calcifications are seen. Other findings as described in the body of the report.   11/26/2021 - 12/05/2021 Hospital Admission   He was admitted to the hospital and found to have metastatic lung cancer   11/27/2021 Initial Diagnosis   Metastatic primary lung cancer, right ([place])   11/27/2021  Imaging   MRI HEAD IMPRESSION:   1. 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 Mild localized edema with hemorrhagic blood products about several of these lesions without significant regional mass effect or midline shift.  2. 3.2 x 6.6 x 4.8 cm enhancing extra-axial mass at the posterior aspect of the superior sagittal sinus, indeterminate. While this finding could reflect an additional dural-based metastasis, a possible concomitant meningioma could also be considered.   MRV HEAD IMPRESSION:   1. Invasion and obliteration of the posterior superior sagittal sinus by the above described dural based mass. Small amount of additional nonocclusive thrombus within the superior sagittal sinus anterior to the mass as above. 2. Remainder of the major dural sinuses are otherwise patent.     11/28/2021 Pathology Results   A. PERITONEAL MASS, UPPER ABDOMINAL MIDLINE, BIOPSY:  High-grade neuroendocrine carcinoma, small cell type (Small cell  carcinoma).  Please see comment.   The following immunostains are performed on block A2 with appropriate  controls:  CK7: Negative.  TTF-1: Positive.  Chromogranin: Negative.  Synoptophysin: Negative.  CD56: Positive.  Ki-67: Very high proliferative index (more than 90%).   Comment: Morphologic features of the neoplasm are most consistent with metastasis from a primary small cell carcinoma in the lung.    12/03/2021 -  Chemotherapy   Patient is on Treatment Plan : LUNG SMALL CELL Carboplatin D1 / Etoposide D1-3 q21d     12/03/2021 -  Chemotherapy   Patient is on Treatment Plan :  LUNG SMALL CELL Carboplatin D1 / Etoposide D1-3 q21d     12/09/2021 Cancer Staging   Staging form: Lung, AJCC 8th Edition - Clinical: Stage IV (cT4, cN3, pM1) - Signed by [name], MD on 12/09/2021      PHYSICAL EXAMINATION: ECOG PERFORMANCE STATUS: 2 - Symptomatic, <50% confined to bed  Vitals:   01/02/22  1134  BP: (!) 89/60  Pulse: 64  Resp: 18  Temp: 97.7 F (36.5 C)  SpO2: 96%   Filed Weights   01/02/22 1134  Weight: 187 lb 9.6 oz (85.1 kg)    GENERAL:alert, no distress and comfortable NEURO: alert & oriented x 3 with fluent speech, no focal motor/sensory deficits  LABORATORY DATA:  I have reviewed the data as listed    Component Value Date/Time   NA 136 12/25/2021 1107   K 3.9 12/25/2021 1107   CL 105 12/25/2021 1107   CO2 23 12/25/2021 1107   GLUCOSE 293 (H) 12/25/2021 1107   BUN 30 (H) 12/25/2021 1107   CREATININE 0.56 (L) 12/25/2021 1107   CREATININE 0.55 (L) 12/19/2021 1426   CALCIUM 8.7 (L) 12/25/2021 1107   PROT 6.8 12/25/2021 1107   ALBUMIN 3.4 (L) 12/25/2021 1107   AST 16 12/25/2021 1107   AST 16 12/19/2021 1426   ALT 56 (H) 12/25/2021 1107   ALT 48 (H) 12/19/2021 1426   ALKPHOS 97 12/25/2021 1107   BILITOT 0.5 12/25/2021 1107   BILITOT 0.5 12/19/2021 1426   GFRNONAA >60 12/25/2021 1107   GFRNONAA >60 12/19/2021 1426   GFRAA >60 01/01/2019 0726    No results found for: SPEP, UPEP  Lab Results  Component Value Date   WBC 13.2 (H) 12/25/2021   NEUTROABS 11.1 (H) 12/25/2021   HGB 13.2 12/25/2021   HCT 38.2 (L) 12/25/2021   MCV 90.1 12/25/2021   PLT 300 12/25/2021      Chemistry      Component Value Date/Time   NA 136 12/25/2021 1107   K 3.9 12/25/2021 1107   CL 105 12/25/2021 1107   CO2 23 12/25/2021 1107   BUN 30 (H) 12/25/2021 1107   CREATININE 0.56 (L) 12/25/2021 1107   CREATININE 0.55 (L) 12/19/2021 1426      Component Value Date/Time   CALCIUM 8.7 (L) 12/25/2021 1107   ALKPHOS 97 12/25/2021 1107   AST 16 12/25/2021 1107   AST 16 12/19/2021 1426   ALT 56 (H) 12/25/2021 1107   ALT 48 (H) 12/19/2021 1426   BILITOT 0.5 12/25/2021 1107   BILITOT 0.5 12/19/2021 1426

## 2022-01-02 NOTE — Assessment & Plan Note (Signed)
Continues to have significant weight loss His appetite and oral intake is poor He has no mouth sores, nausea or problem with swallowing He declined eating due to lack of appetite and taste We have already placed the patient on Remeron and dexamethasone At this point in time, I continue to encourage the patient to increase oral intake as tolerated I do not recommend placement of feeding

## 2022-01-02 NOTE — Patient Instructions (Signed)

## 2022-01-06 ENCOUNTER — Telehealth: Payer: Self-pay

## 2022-01-06 NOTE — Telephone Encounter (Signed)
Returned call to [place], RN with [facility]. She was calling to get verbal orders for Home health. Jatinder refused to look at any areas for wound care and stated that he did not have any areas. Left a message asking her to call the office back.

## 2022-01-06 NOTE — Telephone Encounter (Signed)
Ryan [name] called back and given verbal orders for home health.

## 2022-01-07 ENCOUNTER — Telehealth: Payer: Self-pay

## 2022-01-07 NOTE — Telephone Encounter (Signed)
Pt's PET is scheduled for 1/24 at 7 AM. Pt's wife understands pt must be NPO after midnight.

## 2022-01-08 ENCOUNTER — Encounter: Payer: Self-pay | Admitting: Hematology and Oncology

## 2022-01-08 ENCOUNTER — Telehealth: Payer: Self-pay

## 2022-01-08 ENCOUNTER — Inpatient Hospital Stay: Payer: BC Managed Care – PPO

## 2022-01-08 ENCOUNTER — Other Ambulatory Visit: Payer: Self-pay

## 2022-01-08 ENCOUNTER — Inpatient Hospital Stay: Payer: BC Managed Care – PPO | Admitting: Hematology and Oncology

## 2022-01-08 VITALS — BP 116/74 | HR 92 | Temp 98.0°F | Resp 18 | Ht 74.0 in | Wt 193.6 lb

## 2022-01-08 DIAGNOSIS — E119 Type 2 diabetes mellitus without complications: Secondary | ICD-10-CM

## 2022-01-08 DIAGNOSIS — R634 Abnormal weight loss: Secondary | ICD-10-CM

## 2022-01-08 DIAGNOSIS — C7931 Secondary malignant neoplasm of brain: Secondary | ICD-10-CM | POA: Diagnosis not present

## 2022-01-08 DIAGNOSIS — C3491 Malignant neoplasm of unspecified part of right bronchus or lung: Secondary | ICD-10-CM | POA: Diagnosis not present

## 2022-01-08 DIAGNOSIS — E222 Syndrome of inappropriate secretion of antidiuretic hormone: Secondary | ICD-10-CM

## 2022-01-08 DIAGNOSIS — R5381 Other malaise: Secondary | ICD-10-CM

## 2022-01-08 DIAGNOSIS — I9589 Other hypotension: Secondary | ICD-10-CM

## 2022-01-08 LAB — COMPREHENSIVE METABOLIC PANEL
ALT: 23 U/L (ref 0–44)
AST: 9 U/L — ABNORMAL LOW (ref 15–41)
Albumin: 3.5 g/dL (ref 3.5–5.0)
Alkaline Phosphatase: 108 U/L (ref 38–126)
Anion gap: 7 (ref 5–15)
BUN: 28 mg/dL — ABNORMAL HIGH (ref 8–23)
CO2: 29 mmol/L (ref 22–32)
Calcium: 9.1 mg/dL (ref 8.9–10.3)
Chloride: 98 mmol/L (ref 98–111)
Creatinine, Ser: 0.5 mg/dL — ABNORMAL LOW (ref 0.61–1.24)
GFR, Estimated: >60 mL/min (ref >60)
Glucose, Bld: 191 mg/dL — ABNORMAL HIGH (ref 70–99)
Potassium: 3.7 mmol/L (ref 3.5–5.1)
Sodium: 134 mmol/L — ABNORMAL LOW (ref 135–145)
Total Bilirubin: 0.7 mg/dL (ref 0.3–1.2)
Total Protein: 6.3 g/dL — ABNORMAL LOW (ref 6.5–8.1)

## 2022-01-08 LAB — CBC WITH DIFFERENTIAL/PLATELET
Abs Immature Granulocytes: 0.35 10*3/uL — ABNORMAL HIGH (ref 0.00–0.07)
Basophils Absolute: 0.1 10*3/uL (ref 0.0–0.1)
Basophils Relative: 2 %
Eosinophils Absolute: 0.1 10*3/uL (ref 0.0–0.5)
Eosinophils Relative: 1 %
HCT: 28.5 % — ABNORMAL LOW (ref 39.0–52.0)
Hemoglobin: 10 g/dL — ABNORMAL LOW (ref 13.0–17.0)
Immature Granulocytes: 8 %
Lymphocytes Relative: 17 %
Lymphs Abs: 0.8 10*3/uL (ref 0.7–4.0)
MCH: 32.1 pg (ref 26.0–34.0)
MCHC: 35.1 g/dL (ref 30.0–36.0)
MCV: 91.3 fL (ref 80.0–100.0)
Monocytes Absolute: 0.7 10*3/uL (ref 0.1–1.0)
Monocytes Relative: 15 %
Neutro Abs: 2.7 10*3/uL (ref 1.7–7.7)
Neutrophils Relative %: 57 %
Platelets: 126 10*3/uL — ABNORMAL LOW (ref 150–400)
RBC: 3.12 MIL/uL — ABNORMAL LOW (ref 4.22–5.81)
RDW: 14.1 % (ref 11.5–15.5)
Smear Review: NORMAL
WBC: 4.6 10*3/uL (ref 4.0–10.5)
nRBC: 0.4 % — ABNORMAL HIGH (ref 0.0–0.2)

## 2022-01-08 MED ORDER — HEPARIN SOD (PORK) LOCK FLUSH 100 UNIT/ML IV SOLN
500.0000 [IU] | Freq: Once | INTRAVENOUS | Status: AC
  Administered 2022-01-08: 500 [IU]

## 2022-01-08 MED ORDER — SODIUM CHLORIDE 0.9% FLUSH
10.0000 mL | Freq: Once | INTRAVENOUS | Status: AC
  Administered 2022-01-08: 10 mL

## 2022-01-08 MED ORDER — DEXAMETHASONE 4 MG PO TABS: 2.0000 mg | ORAL_TABLET | Freq: Every day | ORAL | 1 refills | Status: DC

## 2022-01-08 NOTE — Telephone Encounter (Signed)
-----   Message from [name], MD sent at 01/08/2022 10:43 AM EST ----- Pls call [name], his labs are ok

## 2022-01-08 NOTE — Assessment & Plan Note (Signed)
He is doing very well with physical therapy and rehab I encouraged the patient to continue

## 2022-01-08 NOTE — Progress Notes (Signed)
[facility] OFFICE PROGRESS NOTE  Patient Care Team: [name], DO as PCP - General (Internal Medicine) Valrie Hart, RN as Oncology Nurse Navigator (Oncology)  ASSESSMENT & PLAN:  Metastatic primary lung cancer, right ([place]) Overall, he continues to improve clinically I am confident we will have excellent response on his PET CT imaging next week I will attempt to taper him off dexamethasone, with plan to add Tecentriq cycle 3 onwards We discussed the risk and benefits of adding Tecentriq and he is in agreement I will get insurance authorization and to start monitoring his TSH  SIADH (syndrome of inappropriate ADH production) ([place]) This is almost completely resolved Monitor closely  DM2 (diabetes mellitus, type 2) ([place]) This is improved with recent dexamethasone taper I continue to encourage the patient to increase regular food intake as tolerated and avoid sugary diet  Hypotension He is not symptomatic with clinical hypotension Observe closely  Physical debility He is doing very well with physical therapy and rehab I encouraged the patient to continue  Weight loss This has stabilized I encouraged him to increase oral intake as tolerated The Remeron might have helped  Metastatic cancer to brain [facility]) He has no recent headaches or seizures I would defer to radiation oncologist and neuro oncologist to order imaging study of his brain independently  Orders Placed This Encounter  Procedures   TSH    Standing Status:   Standing    Number of Occurrences:   22    Standing Expiration Date:   01/08/2023    All questions were answered. The patient knows to call the clinic with any problems, questions or concerns. The total time spent in the appointment was 40 minutes encounter with patients including review of chart and various tests results, discussions about plan of care and coordination of care plan   [name], MD 01/08/2022 4:44 PM  INTERVAL  HISTORY: Please see below for problem oriented charting. he returns for surveillance follow-up with his significant other He is doing better Appetite has improved He has gained some weight Denies recent diarrhea or nausea No recent headaches No recent falls He is getting stronger and is able to walk short distance without assistance  REVIEW OF SYSTEMS:   Constitutional: Denies fevers, chills or abnormal weight loss Eyes: Denies blurriness of vision Ears, nose, mouth, throat, and face: Denies mucositis or sore throat Respiratory: Denies cough, dyspnea or wheezes Cardiovascular: Denies palpitation, chest discomfort or lower extremity swelling Gastrointestinal:  Denies nausea, heartburn or change in bowel habits Skin: Denies abnormal skin rashes Lymphatics: Denies new lymphadenopathy or easy bruising Neurological:Denies numbness, tingling or new weaknesses Behavioral/Psych: Mood is stable, no new changes  All other systems were reviewed with the patient and are negative.  I have reviewed the past medical history, past surgical history, social history and family history with the patient and they are unchanged from previous note.  ALLERGIES:  is allergic to hydralazine and percocet [oxycodone-acetaminophen].  MEDICATIONS:  Current Outpatient Medications  Medication Sig Dispense Refill   atorvastatin (LIPITOR) 20 MG tablet Take 20 mg by mouth daily.     dexamethasone (DECADRON) 4 MG tablet Take 0.5 tablets (2 mg total) by mouth daily. 60 tablet 1   lidocaine-prilocaine (EMLA) cream Apply 1 application topically as needed. 30 g 3   memantine (NAMENDA) 10 MG tablet Take 1 tablet (10 mg total) by mouth 2 (two) times daily. 60 tablet 4   mirtazapine (REMERON) 30 MG tablet Take 1 tablet (30  mg total) by mouth at bedtime. 30 tablet 1   ondansetron (ZOFRAN) 8 MG tablet Take 1 tablet (8 mg total) by mouth every 8 (eight) hours as needed for nausea or vomiting. 30 tablet 1   pantoprazole  (PROTONIX) 40 MG tablet Take 1 tablet (40 mg total) by mouth daily. 30 tablet 2   No current facility-administered medications for this visit.    SUMMARY OF ONCOLOGIC HISTORY: Oncology History  Metastatic cancer to brain ([place])  11/27/2021 Initial Diagnosis   Metastatic cancer to brain [facility])   12/03/2021 -  Chemotherapy   Patient is on Treatment Plan : LUNG SMALL CELL Carboplatin D1 / Etoposide D1-3 q21d     Metastatic primary lung cancer, right ([place])  11/26/2021 Imaging   There is 2.9 cm lobulated noncalcified pleural-based nodule in the right upper lobe suggesting possible primary malignant neoplasm. There are pathologically enlarged bulky lymph nodes in the mediastinum and both hilar regions, more so on the right side suggesting metastatic lymphadenopathy. There is extrinsic pressure over the right main pulmonary artery and superior vena cava by the enlarged lymph nodes in mediastinum.   Increased interstitial markings and small scattered nodules are seen in the right lung which may be part of pneumonia or pulmonary metastatic disease.   There are nodular densities in both adrenals largest measuring 3 cm in the left adrenal suggesting possible metastatic disease. There are enlarged lymph nodes in the retroperitoneum and mesentery largest measuring 5.9 cm in maximum diameter suggesting metastatic lymphadenopathy.   There are multiple cysts in the liver. There are other indeterminate low-density lesions of varying sizes in the liver. Possibility of hepatic metastatic disease is not excluded.   Head of the pancreas is enlarged in size with lobulated margins. Possibility of neoplastic process in the head of the pancreas is not excluded. Follow-up PET-CT and biopsy as warranted should be considered.   Coronary artery calcifications are seen. Other findings as described in the body of the report.   11/26/2021 - 12/05/2021 Hospital Admission   He was admitted to the hospital and found to have  metastatic lung cancer   11/27/2021 Initial Diagnosis   Metastatic primary lung cancer, right ([place])   11/27/2021 Imaging   MRI HEAD IMPRESSION:   1. 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 Mild localized edema with hemorrhagic blood products about several of these lesions without significant regional mass effect or midline shift.  2. 3.2 x 6.6 x 4.8 cm enhancing extra-axial mass at the posterior aspect of the superior sagittal sinus, indeterminate. While this finding could reflect an additional dural-based metastasis, a possible concomitant meningioma could also be considered.   MRV HEAD IMPRESSION:   1. Invasion and obliteration of the posterior superior sagittal sinus by the above described dural based mass. Small amount of additional nonocclusive thrombus within the superior sagittal sinus anterior to the mass as above. 2. Remainder of the major dural sinuses are otherwise patent.     11/28/2021 Pathology Results   A. PERITONEAL MASS, UPPER ABDOMINAL MIDLINE, BIOPSY:  High-grade neuroendocrine carcinoma, small cell type (Small cell  carcinoma).  Please see comment.   The following immunostains are performed on block A2 with appropriate  controls:  CK7: Negative.  TTF-1: Positive.  Chromogranin: Negative.  Synoptophysin: Negative.  CD56: Positive.  Ki-67: Very high proliferative index (more than 90%).   Comment: Morphologic features of the neoplasm are most consistent with metastasis from a primary small cell carcinoma in the lung.  12/03/2021 -  Chemotherapy   Patient is on Treatment Plan : LUNG SMALL CELL Carboplatin D1 / Etoposide D1-3 q21d     12/03/2021 -  Chemotherapy   Patient is on Treatment Plan :  LUNG SMALL CELL Carboplatin D1 / Etoposide D1-3 q21d     12/09/2021 Cancer Staging   Staging form: Lung, AJCC 8th Edition - Clinical: Stage IV (cT4, cN3, pM1) - Signed by [name], MD on  12/09/2021      PHYSICAL EXAMINATION: ECOG PERFORMANCE STATUS: 2 - Symptomatic, <50% confined to bed  Vitals:   01/08/22 1018  BP: 116/74  Pulse: 92  Resp: 18  Temp: 98 F (36.7 C)  SpO2: 94%   Filed Weights   01/08/22 1018  Weight: 193 lb 9.6 oz (87.8 kg)    GENERAL:alert, no distress and comfortable SKIN: skin color, texture, turgor are normal, no rashes or significant lesions EYES: normal, Conjunctiva are pink and non-injected, sclera clear OROPHARYNX:no exudate, no erythema and lips, buccal mucosa, and tongue normal  NECK: supple, thyroid normal size, non-tender, without nodularity LYMPH:  no palpable lymphadenopathy in the cervical, axillary or inguinal LUNGS: clear to auscultation and percussion with normal breathing effort HEART: regular rate & rhythm and no murmurs and no lower extremity edema ABDOMEN:abdomen soft, non-tender and normal bowel sounds Musculoskeletal:no cyanosis of digits and no clubbing  NEURO: alert & oriented x 3 with fluent speech, no focal motor/sensory deficits  LABORATORY DATA:  I have reviewed the data as listed    Component Value Date/Time   NA 134 (L) 01/08/2022 1000   K 3.7 01/08/2022 1000   CL 98 01/08/2022 1000   CO2 29 01/08/2022 1000   GLUCOSE 191 (H) 01/08/2022 1000   BUN 28 (H) 01/08/2022 1000   CREATININE 0.50 (L) 01/08/2022 1000   CREATININE 0.55 (L) 12/19/2021 1426   CALCIUM 9.1 01/08/2022 1000   PROT 6.3 (L) 01/08/2022 1000   ALBUMIN 3.5 01/08/2022 1000   AST 9 (L) 01/08/2022 1000   AST 16 12/19/2021 1426   ALT 23 01/08/2022 1000   ALT 48 (H) 12/19/2021 1426   ALKPHOS 108 01/08/2022 1000   BILITOT 0.7 01/08/2022 1000   BILITOT 0.5 12/19/2021 1426   GFRNONAA >60 01/08/2022 1000   GFRNONAA >60 12/19/2021 1426   GFRAA >60 01/01/2019 0726    No results found for: SPEP, UPEP  Lab Results  Component Value Date   WBC 4.6 01/08/2022   NEUTROABS 2.7 01/08/2022   HGB 10.0 (L) 01/08/2022   HCT 28.5 (L) 01/08/2022    MCV 91.3 01/08/2022   PLT 126 (L) 01/08/2022      Chemistry      Component Value Date/Time   NA 134 (L) 01/08/2022 1000   K 3.7 01/08/2022 1000   CL 98 01/08/2022 1000   CO2 29 01/08/2022 1000   BUN 28 (H) 01/08/2022 1000   CREATININE 0.50 (L) 01/08/2022 1000   CREATININE 0.55 (L) 12/19/2021 1426      Component Value Date/Time   CALCIUM 9.1 01/08/2022 1000   ALKPHOS 108 01/08/2022 1000   AST 9 (L) 01/08/2022 1000   AST 16 12/19/2021 1426   ALT 23 01/08/2022 1000   ALT 48 (H) 12/19/2021 1426   BILITOT 0.7 01/08/2022 1000   BILITOT 0.5 12/19/2021 1426

## 2022-01-08 NOTE — Assessment & Plan Note (Signed)
Overall, he continues to improve clinically I am confident we will have excellent response on his PET CT imaging next week I will attempt to taper him off dexamethasone, with plan to add Tecentriq cycle 3 onwards We discussed the risk and benefits of adding Tecentriq and he is in agreement I will get insurance authorization and to start monitoring his TSH

## 2022-01-08 NOTE — Assessment & Plan Note (Signed)
He is not symptomatic with clinical hypotension Observe closely

## 2022-01-08 NOTE — Assessment & Plan Note (Signed)
This has stabilized I encouraged him to increase oral intake as tolerated The Remeron might have helped

## 2022-01-08 NOTE — Assessment & Plan Note (Signed)
This is almost completely resolved Monitor closely

## 2022-01-08 NOTE — Assessment & Plan Note (Signed)
This is improved with recent dexamethasone taper I continue to encourage the patient to increase regular food intake as tolerated and avoid sugary diet

## 2022-01-08 NOTE — Telephone Encounter (Signed)
Called and given below message. [name] verbalized understanding.

## 2022-01-08 NOTE — Assessment & Plan Note (Signed)
He has no recent headaches or seizures I would defer to radiation oncologist and neuro oncologist to order imaging study of his brain independently

## 2022-01-08 NOTE — Telephone Encounter (Signed)
No answer. I did not leave a message.

## 2022-01-13 ENCOUNTER — Encounter: Payer: Self-pay | Admitting: Urology

## 2022-01-13 ENCOUNTER — Other Ambulatory Visit: Payer: Self-pay

## 2022-01-13 ENCOUNTER — Encounter (HOSPITAL_COMMUNITY)
Admission: RE | Admit: 2022-01-13 | Discharge: 2022-01-13 | Disposition: A | Discharge status: Home / Self Care | Payer: BC Managed Care – PPO | Source: Ambulatory Visit | Attending: Hematology and Oncology | Admitting: Hematology and Oncology

## 2022-01-13 DIAGNOSIS — C3490 Malignant neoplasm of unspecified part of unspecified bronchus or lung: Secondary | ICD-10-CM | POA: Diagnosis present

## 2022-01-13 LAB — GLUCOSE, CAPILLARY: Glucose-Capillary: 175 mg/dL — ABNORMAL HIGH (ref 70–99)

## 2022-01-13 IMAGING — PT NM PET TUM IMG INITIAL (PI) SKULL BASE T - THIGH
8 series · 25 of 25 positions shown · non-contrast
Comparison: [DATE] chest abdomen and pelvic CTs.

CLINICAL DATA: Initial treatment strategy for small-cell lung
cancer.

EXAM:
NUCLEAR MEDICINE PET SKULL BASE TO THIGH
TECHNIQUE: 9.4 mCi F-18 FDG was injected intravenously. Full-ring PET imaging
was performed from the skull base to thigh after the radiotracer. CT
data was obtained and used for attenuation correction and anatomic
localization.
Fasting blood glucose: 175 mg/dl

[Series 3: pet sk_thigh ac · axial · 5.0mm · 4.07mm/px · z∈[-1584,-504]mm · 5 of 271 slices shown]
[im 1/271]
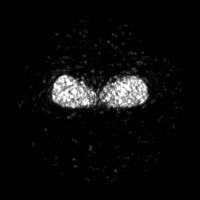
[im 68/271]
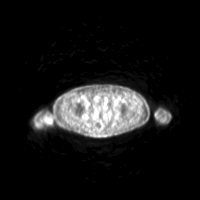
[im 136/271]
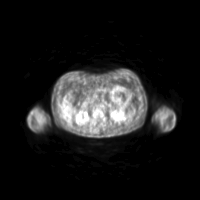
[im 203/271]
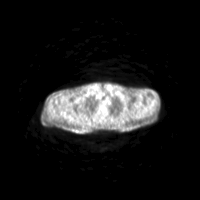
[im 271/271]
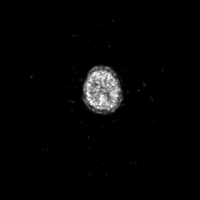

[Series 4: ct sk_thigh 5.0 bf37 · axial · 5.0mm · 0.98mm/px · z∈[-1584,-504]mm · 5 of 271 slices shown]
[im 1/271]
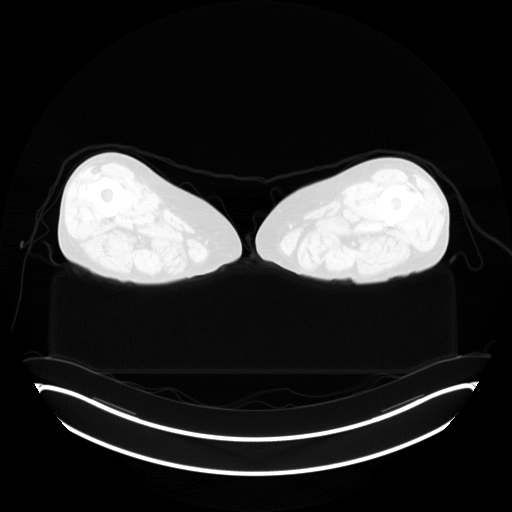
[im 68/271]
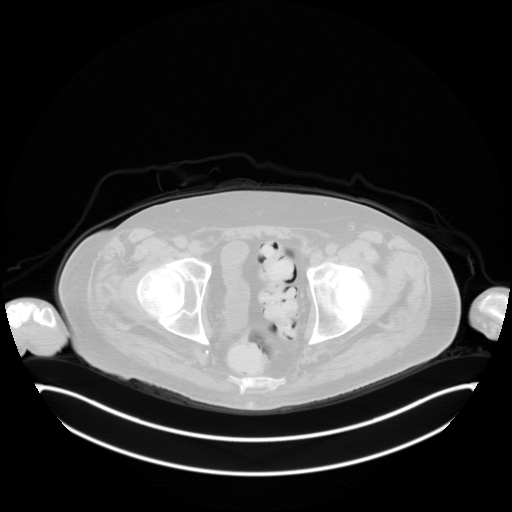
[im 136/271]
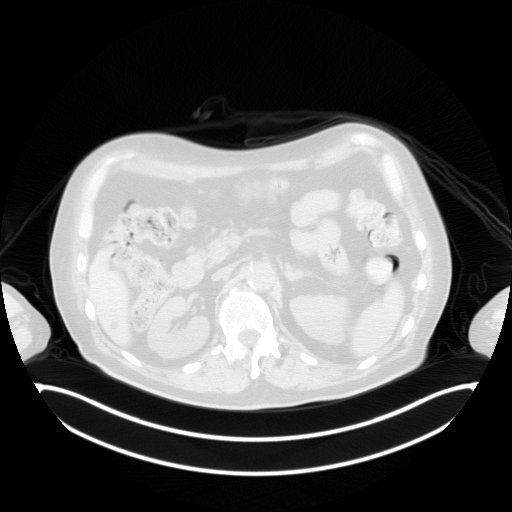
[im 203/271]
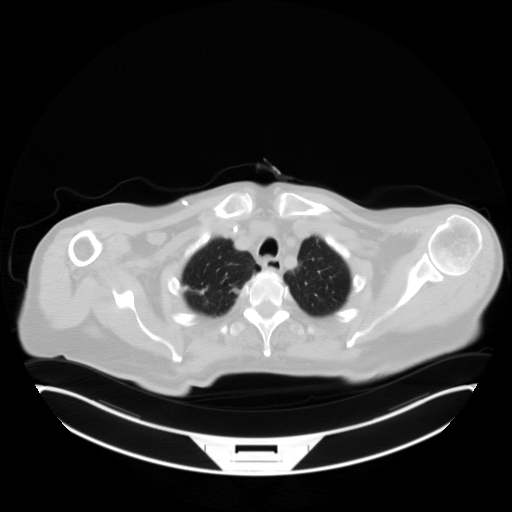
[im 271/271  brain]
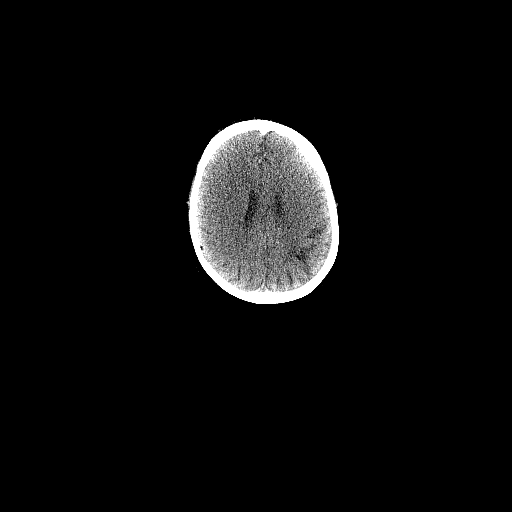

[Series 5: pet sk_thigh nac · axial · 5.0mm · 4.07mm/px · z∈[-1584,-504]mm · 6 of 271 slices shown]
[im 1/271]
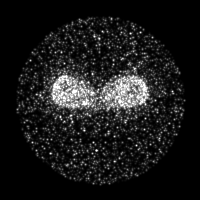
[im 55/271]
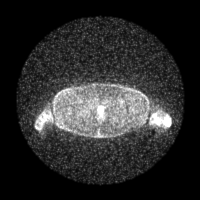
[im 109/271]
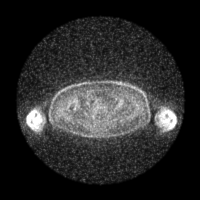
[im 163/271]
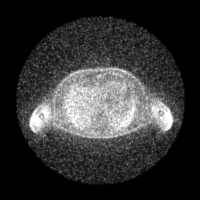
[im 217/271]
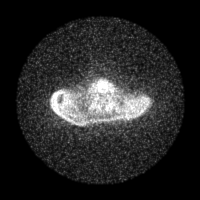
[im 271/271]
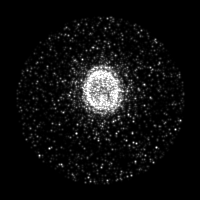

[Series 8: ct sk_thigh 5.0 br59 lung_bone · axial · 5.0mm · 0.67mm/px · 1 of 61 slices shown]
[im 1/61]
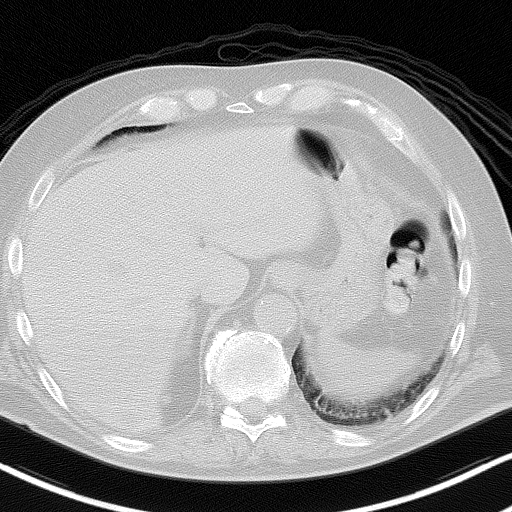

[Series 603: <mip collection> · coronal · 2.24mm/px · 1 of 32 slices shown]
[im 1/32]
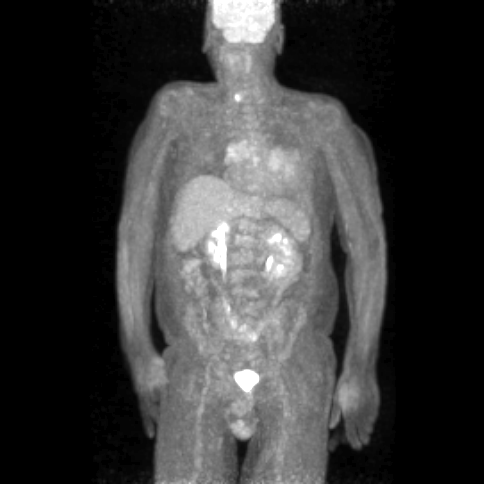

[Series 604: fused cor · 1 of 38 slices shown]
[im 1/38]
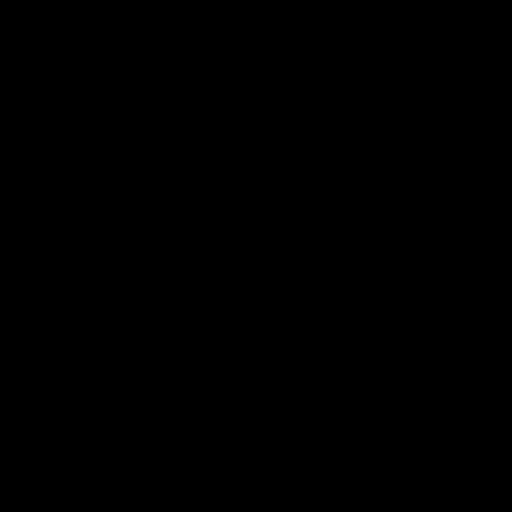

[Series 605: range-ct sk_thigh 5.0 bf37-tra-<alpha range> · 5 of 264 slices shown]
[im 1/264]
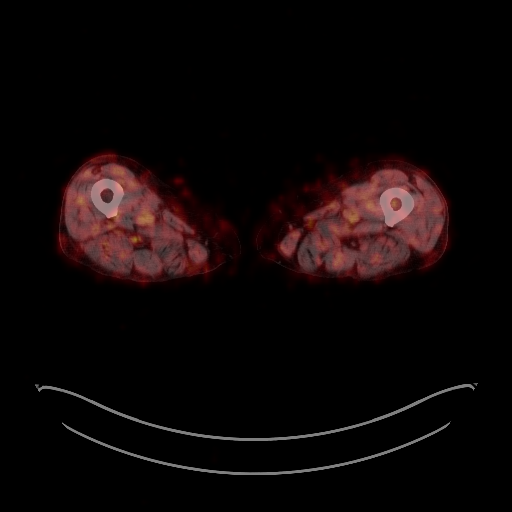
[im 66/264]
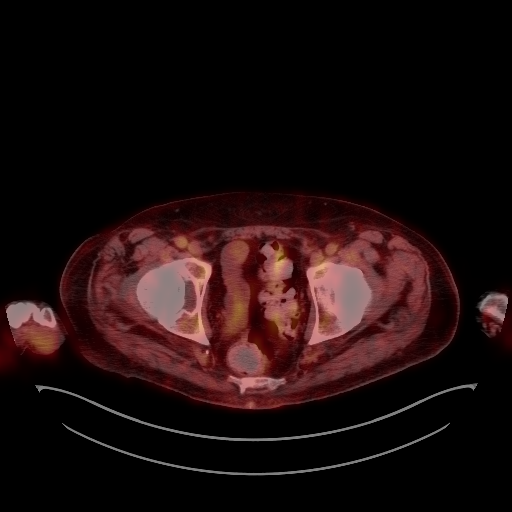
[im 132/264]
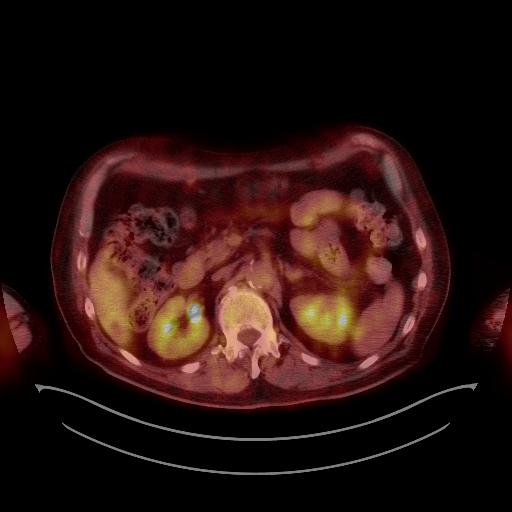
[im 198/264]
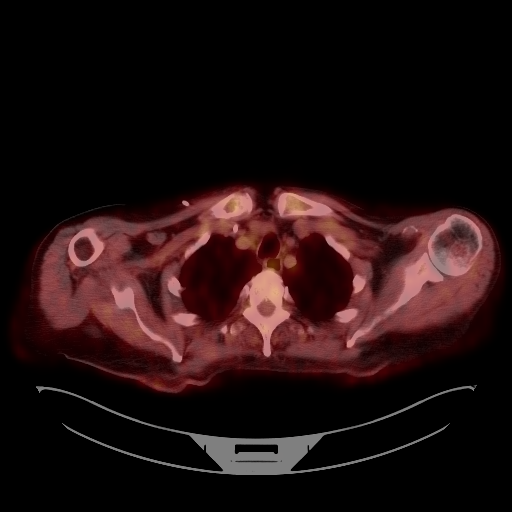
[im 264/264]
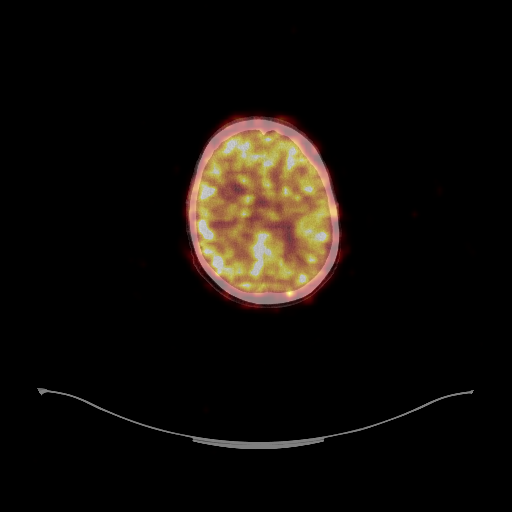

[Series 1089: results mm oncology reading · 3.0mm · 0.94mm/px · 1 of 8 slices shown]
[im 1/8]
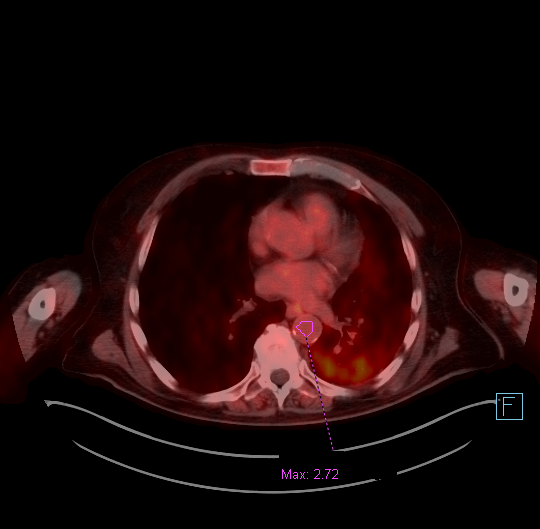

[25 of 25 positions shown; findings below may reference images not displayed]

FINDINGS: Mediastinal blood pool activity: SUV max

Liver activity: SUV max NA

NECK: Right vocal cord hypermetabolism may relate to left-sided
paralysis, but is nonspecific. No well-defined mass in this area. No
cervical nodal hypermetabolism.

Incidental CT findings: Bilateral carotid atherosclerosis. No
cervical adenopathy.

CHEST: Right paratracheal node measures 2.4 cm and a S.U.V. max of
4.4 on 83/4. This is significantly decreased from on the order of
6.5 cm on the prior CT (when remeasured).

Other areas of confluent mediastinal adenopathy have significantly
improved.

The presumed right upper lobe primary has nearly completely
resolved, with only linear opacity remaining including on [DATE].
Low-level hypermetabolism at a S.U.V. max of 2.2.

Development of posterior left upper and superior segment left lower
lobe septal thickening and ground-glass opacity with
hypermetabolism. Example at a S.U.V. max of 4.0 including on 35/8.

Incidental CT findings: Centrilobular emphysema. Right Port-A-Cath
tip superior caval/atrial junction. Aortic and coronary artery
calcification.

ABDOMEN/PELVIS: Right adrenal nodule measures 1.9 cm and a S.U.V.
max of 4.6 on 118/4. Similar in size 2.0 cm on the prior.

The left adrenal nodule is decreased in size and there is no
hypermetabolism in this area.

Gastrohepatic ligament node measures 2.5 cm and a S.U.V. max of
on 125/4 versus 5.4 cm on the prior CT (when remeasured).

An omental 1.6 cm nodule measures a S.U.V. max of 3.1 on 141/4
versus 4.4 cm on the prior CT (when remeasured).

Incidental CT findings: Hepatic cysts. Other liver lesions are not
definitely simple cysts, but are without correlate hypermetabolism.
Example hepatic dome at 1.0 cm on 103/4, similar to on the prior
diagnostic CT.

Abdominal aortic atherosclerosis.  Prostatomegaly.

SKELETON: No abnormal marrow activity.

Incidental CT findings: none
IMPRESSION: 1. Since the CTs of [DATE], response to therapy of widespread
metastatic small-cell carcinoma as detailed above. Near complete
resolution right upper lobe primary with significant improvement in
thoracic nodal, adrenal, abdominal nodal, and peritoneal metastasis.
2. No new or progressive disease.
3. Development of left-sided hypermetabolic pulmonary opacity.
Suspicious for infection.

## 2022-01-13 MED ORDER — FLUDEOXYGLUCOSE F - 18 (FDG) INJECTION
10.0000 | Freq: Once | INTRAVENOUS | Status: AC | PRN
  Administered 2022-01-13: 07:00:00 9.4 via INTRAVENOUS

## 2022-01-13 NOTE — Progress Notes (Signed)
Spoke w/ patient's spouse '[name]', verified her identity, and begin nursing interview. She states "Mr. Abdallah Hern is experiencing some memory difficulties, but otherwise doing well."  Meaningful use complete.  Spouse notified of Mr. Praneel Klemz's 9:30am-01/22/22 telephone appointment w/ [name]. I left my extension [phone] in case patient needs to call. Spouse '[name]' verbalized understanding of information given.  Patient contact [phone]

## 2022-01-15 ENCOUNTER — Other Ambulatory Visit: Payer: BC Managed Care – PPO

## 2022-01-15 ENCOUNTER — Inpatient Hospital Stay: Payer: BC Managed Care – PPO | Admitting: Hematology and Oncology

## 2022-01-15 ENCOUNTER — Inpatient Hospital Stay: Payer: BC Managed Care – PPO

## 2022-01-16 ENCOUNTER — Ambulatory Visit (HOSPITAL_COMMUNITY): Payer: BC Managed Care – PPO

## 2022-01-16 MED FILL — Dexamethasone Sodium Phosphate Inj 100 MG/10ML: INTRAMUSCULAR | Qty: 1 | Status: AC

## 2022-01-16 MED FILL — Fosaprepitant Dimeglumine For IV Infusion 150 MG (Base Eq): INTRAVENOUS | Qty: 5 | Status: AC

## 2022-01-19 ENCOUNTER — Inpatient Hospital Stay: Payer: BC Managed Care – PPO

## 2022-01-19 ENCOUNTER — Inpatient Hospital Stay: Payer: BC Managed Care – PPO | Admitting: Nutrition

## 2022-01-19 ENCOUNTER — Encounter: Payer: Self-pay | Admitting: Hematology and Oncology

## 2022-01-19 ENCOUNTER — Telehealth: Payer: Self-pay

## 2022-01-19 ENCOUNTER — Other Ambulatory Visit: Payer: Self-pay

## 2022-01-19 ENCOUNTER — Inpatient Hospital Stay: Payer: BC Managed Care – PPO | Admitting: Hematology and Oncology

## 2022-01-19 DIAGNOSIS — C3491 Malignant neoplasm of unspecified part of right bronchus or lung: Secondary | ICD-10-CM | POA: Diagnosis not present

## 2022-01-19 DIAGNOSIS — C7931 Secondary malignant neoplasm of brain: Secondary | ICD-10-CM

## 2022-01-19 DIAGNOSIS — E119 Type 2 diabetes mellitus without complications: Secondary | ICD-10-CM

## 2022-01-19 DIAGNOSIS — E222 Syndrome of inappropriate secretion of antidiuretic hormone: Secondary | ICD-10-CM

## 2022-01-19 LAB — COMPREHENSIVE METABOLIC PANEL
ALT: 24 U/L (ref 0–44)
AST: 11 U/L — ABNORMAL LOW (ref 15–41)
Albumin: 3.5 g/dL (ref 3.5–5.0)
Alkaline Phosphatase: 105 U/L (ref 38–126)
Anion gap: 6 (ref 5–15)
BUN: 27 mg/dL — ABNORMAL HIGH (ref 8–23)
CO2: 30 mmol/L (ref 22–32)
Calcium: 9.2 mg/dL (ref 8.9–10.3)
Chloride: 100 mmol/L (ref 98–111)
Creatinine, Ser: 0.53 mg/dL — ABNORMAL LOW (ref 0.61–1.24)
GFR, Estimated: >60 mL/min (ref >60)
Glucose, Bld: 185 mg/dL — ABNORMAL HIGH (ref 70–99)
Potassium: 3.8 mmol/L (ref 3.5–5.1)
Sodium: 136 mmol/L (ref 135–145)
Total Bilirubin: 0.6 mg/dL (ref 0.3–1.2)
Total Protein: 6.6 g/dL (ref 6.5–8.1)

## 2022-01-19 LAB — CBC WITH DIFFERENTIAL/PLATELET
Abs Immature Granulocytes: 0.23 10*3/uL — ABNORMAL HIGH (ref 0.00–0.07)
Basophils Absolute: 0.1 10*3/uL (ref 0.0–0.1)
Basophils Relative: 1 %
Eosinophils Absolute: 0.1 10*3/uL (ref 0.0–0.5)
Eosinophils Relative: 1 %
HCT: 32 % — ABNORMAL LOW (ref 39.0–52.0)
Hemoglobin: 10.6 g/dL — ABNORMAL LOW (ref 13.0–17.0)
Immature Granulocytes: 2 %
Lymphocytes Relative: 6 %
Lymphs Abs: 0.9 10*3/uL (ref 0.7–4.0)
MCH: 32.2 pg (ref 26.0–34.0)
MCHC: 33.1 g/dL (ref 30.0–36.0)
MCV: 97.3 fL (ref 80.0–100.0)
Monocytes Absolute: 1 10*3/uL (ref 0.1–1.0)
Monocytes Relative: 7 %
Neutro Abs: 12.2 10*3/uL — ABNORMAL HIGH (ref 1.7–7.7)
Neutrophils Relative %: 83 %
Platelets: 338 10*3/uL (ref 150–400)
RBC: 3.29 MIL/uL — ABNORMAL LOW (ref 4.22–5.81)
RDW: 18.3 % — ABNORMAL HIGH (ref 11.5–15.5)
WBC: 14.5 10*3/uL — ABNORMAL HIGH (ref 4.0–10.5)
nRBC: 0 % (ref 0.0–0.2)

## 2022-01-19 LAB — TSH: TSH: 1.94 u[IU]/mL (ref 0.320–4.118)

## 2022-01-19 MED ORDER — SODIUM CHLORIDE 0.9 % IV SOLN
565.0000 mg | Freq: Once | INTRAVENOUS | Status: AC
  Administered 2022-01-19: 570 mg via INTRAVENOUS
  Filled 2022-01-19: qty 57

## 2022-01-19 MED ORDER — SODIUM CHLORIDE 0.9 % IV SOLN
1200.0000 mg | Freq: Once | INTRAVENOUS | Status: AC
  Administered 2022-01-19: 1200 mg via INTRAVENOUS
  Filled 2022-01-19: qty 20

## 2022-01-19 MED ORDER — MIRTAZAPINE 30 MG PO TABS: 30.0000 mg | ORAL_TABLET | Freq: Every day | ORAL | 1 refills | Status: DC

## 2022-01-19 MED ORDER — HEPARIN SOD (PORK) LOCK FLUSH 100 UNIT/ML IV SOLN
500.0000 [IU] | Freq: Once | INTRAVENOUS | Status: AC | PRN
  Administered 2022-01-19: 500 [IU]

## 2022-01-19 MED ORDER — PALONOSETRON HCL INJECTION 0.25 MG/5ML
0.2500 mg | Freq: Once | INTRAVENOUS | Status: AC
  Administered 2022-01-19: 0.25 mg via INTRAVENOUS
  Filled 2022-01-19: qty 5

## 2022-01-19 MED ORDER — SODIUM CHLORIDE 0.9 % IV SOLN: Freq: Once | INTRAVENOUS | Status: AC

## 2022-01-19 MED ORDER — SODIUM CHLORIDE 0.9 % IV SOLN
100.0000 mg/m2 | Freq: Once | INTRAVENOUS | Status: AC
  Administered 2022-01-19: 210 mg via INTRAVENOUS
  Filled 2022-01-19: qty 10.5

## 2022-01-19 MED ORDER — SODIUM CHLORIDE 0.9% FLUSH
10.0000 mL | INTRAVENOUS | Status: DC | PRN
  Administered 2022-01-19: 10 mL

## 2022-01-19 MED ORDER — SODIUM CHLORIDE 0.9 % IV SOLN
10.0000 mg | Freq: Once | INTRAVENOUS | Status: AC
  Administered 2022-01-19: 10 mg via INTRAVENOUS
  Filled 2022-01-19: qty 10

## 2022-01-19 MED ORDER — SODIUM CHLORIDE 0.9% FLUSH
10.0000 mL | Freq: Once | INTRAVENOUS | Status: AC
  Administered 2022-01-19: 10 mL

## 2022-01-19 MED ORDER — SODIUM CHLORIDE 0.9 % IV SOLN
150.0000 mg | Freq: Once | INTRAVENOUS | Status: AC
  Administered 2022-01-19: 150 mg via INTRAVENOUS
  Filled 2022-01-19: qty 150

## 2022-01-19 MED FILL — Dexamethasone Sodium Phosphate Inj 100 MG/10ML: INTRAMUSCULAR | Qty: 1 | Status: AC

## 2022-01-19 NOTE — Telephone Encounter (Signed)
Ryan [name] called and left a message. She was so thrilled with the good news this morning from the PET results and appreciates everything that you do. She forgot to ask if Lord should go back on his diabetes medication?

## 2022-01-19 NOTE — Telephone Encounter (Signed)
Since he is eating better, yes

## 2022-01-19 NOTE — Assessment & Plan Note (Signed)
I would defer to radiation oncologist to order repeat imaging study

## 2022-01-19 NOTE — Telephone Encounter (Signed)
Called and given below message to [facility]. She verbalized understanding and will restart diabetes medication.

## 2022-01-19 NOTE — Progress Notes (Signed)
[place] OFFICE PROGRESS NOTE  Patient Care Team: [name], MD as PCP - General (Hematology and Oncology) Valrie Hart, RN as Oncology Nurse Navigator (Oncology)  ASSESSMENT & PLAN:  Metastatic primary lung cancer, right [facility]) I have reviewed multiple imaging studies with the patient and his significant other He has excellent response to therapy with near complete response We will continue 2 more cycles of treatment with addition of checkpoint inhibitors We will wean him off dexamethasone I plan to repeat imaging study again in March for further follow-up  Metastatic cancer to brain [facility]) I would defer to radiation oncologist to order repeat imaging study  DM2 (diabetes mellitus, type 2) ([place]) His blood sugar is better controlled with reduced dose dexamethasone Monitor closely  SIADH (syndrome of inappropriate ADH production) ([place]) This is resolved  No orders of the defined types were placed in this encounter.   All questions were answered. The patient knows to call the clinic with any problems, questions or concerns. The total time spent in the appointment was 30 minutes encounter with patients including review of chart and various tests results, discussions about plan of care and coordination of care plan   [name], MD 01/19/2022 1:21 PM  INTERVAL HISTORY: Please see below for problem oriented charting. he returns for treatment follow-up He is doing well He is eating better and gaining weight No recent falls Denies recent headaches or seizures  REVIEW OF SYSTEMS:   Constitutional: Denies fevers, chills or abnormal weight loss Eyes: Denies blurriness of vision Ears, nose, mouth, throat, and face: Denies mucositis or sore throat Respiratory: Denies cough, dyspnea or wheezes Cardiovascular: Denies palpitation, chest discomfort or lower extremity swelling Gastrointestinal:  Denies nausea, heartburn or change in bowel habits Skin: Denies abnormal  skin rashes Lymphatics: Denies new lymphadenopathy or easy bruising Neurological:Denies numbness, tingling or new weaknesses Behavioral/Psych: Mood is stable, no new changes  All other systems were reviewed with the patient and are negative.  I have reviewed the past medical history, past surgical history, social history and family history with the patient and they are unchanged from previous note.  ALLERGIES:  is allergic to hydralazine and percocet [oxycodone-acetaminophen].  MEDICATIONS:  Current Outpatient Medications  Medication Sig Dispense Refill   atorvastatin (LIPITOR) 20 MG tablet Take 20 mg by mouth daily.     lidocaine-prilocaine (EMLA) cream Apply 1 application topically as needed. 30 g 3   memantine (NAMENDA) 10 MG tablet Take 1 tablet (10 mg total) by mouth 2 (two) times daily. 60 tablet 4   mirtazapine (REMERON) 30 MG tablet Take 1 tablet (30 mg total) by mouth at bedtime. 90 tablet 1   ondansetron (ZOFRAN) 8 MG tablet Take 1 tablet (8 mg total) by mouth every 8 (eight) hours as needed for nausea or vomiting. 30 tablet 1   pantoprazole (PROTONIX) 40 MG tablet Take 1 tablet (40 mg total) by mouth daily. (Patient not taking: Reported on 01/13/2022) 30 tablet 2   No current facility-administered medications for this visit.   Facility-Administered Medications Ordered in Other Visits  Medication Dose Route Frequency Provider Last Rate Last Admin   CARBOplatin (PARAPLATIN) 570 mg in sodium chloride 0.9 % 250 mL chemo infusion  570 mg Intravenous Once Alvy Bimler, Hanson Medeiros, MD       etoposide (VEPESID) 210 mg in sodium chloride 0.9 % 1,000 mL chemo infusion  100 mg/m2 (Treatment Plan Recorded) Intravenous Once [name], MD 1,011 mL/hr at 01/19/22 1250 210 mg at 01/19/22 1250  heparin lock flush 100 unit/mL  500 Units Intracatheter Once PRN Alvy Bimler, Arma Reining, MD       sodium chloride flush (NS) 0.9 % injection 10 mL  10 mL Intracatheter PRN [name], MD        SUMMARY OF ONCOLOGIC  HISTORY: Oncology History  Metastatic cancer to brain ([place])  11/27/2021 Initial Diagnosis   Metastatic cancer to brain ([place])   12/03/2021 -  Chemotherapy   Patient is on Treatment Plan : LUNG SMALL CELL Carboplatin D1 / Etoposide D1-3 q21d     Metastatic primary lung cancer, right ([place])  11/26/2021 Imaging   There is 2.9 cm lobulated noncalcified pleural-based nodule in the right upper lobe suggesting possible primary malignant neoplasm. There are pathologically enlarged bulky lymph nodes in the mediastinum and both hilar regions, more so on the right side suggesting metastatic lymphadenopathy. There is extrinsic pressure over the right main pulmonary artery and superior vena cava by the enlarged lymph nodes in mediastinum.   Increased interstitial markings and small scattered nodules are seen in the right lung which may be part of pneumonia or pulmonary metastatic disease.   There are nodular densities in both adrenals largest measuring 3 cm in the left adrenal suggesting possible metastatic disease. There are enlarged lymph nodes in the retroperitoneum and mesentery largest measuring 5.9 cm in maximum diameter suggesting metastatic lymphadenopathy.   There are multiple cysts in the liver. There are other indeterminate low-density lesions of varying sizes in the liver. Possibility of hepatic metastatic disease is not excluded.   Head of the pancreas is enlarged in size with lobulated margins. Possibility of neoplastic process in the head of the pancreas is not excluded. Follow-up PET-CT and biopsy as warranted should be considered.   Coronary artery calcifications are seen. Other findings as described in the body of the report.   11/26/2021 - 12/05/2021 Hospital Admission   He was admitted to the hospital and found to have metastatic lung cancer   11/27/2021 Initial Diagnosis   Metastatic primary lung cancer, right ([place])   11/27/2021 Imaging   MRI HEAD IMPRESSION:   1. Widespread  intracranial metastatic disease involving both cerebral hemispheres and cerebellum as above. Mild localized edema with hemorrhagic blood products about several of these lesions without significant regional mass effect or midline shift.  2. 3.2 x 6.6 x 4.8 cm enhancing extra-axial mass at the posterior aspect of the superior sagittal sinus, indeterminate. While this finding could reflect an additional dural-based metastasis, a possible concomitant meningioma could also be considered.   MRV HEAD IMPRESSION:   1. Invasion and obliteration of the posterior superior sagittal sinus by the above described dural based mass. Small amount of additional nonocclusive thrombus within the superior sagittal sinus anterior to the mass as above. 2. Remainder of the major dural sinuses are otherwise patent.     11/28/2021 Pathology Results   A. PERITONEAL MASS, UPPER ABDOMINAL MIDLINE, BIOPSY:  High-grade neuroendocrine carcinoma, small cell type (Small cell  carcinoma).  Please see comment.   The following immunostains are performed on block A2 with appropriate  controls:  CK7: Negative.  TTF-1: Positive.  Chromogranin: Negative.  Synoptophysin: Negative.  CD56: Positive.  Ki-67: Very high proliferative index (more than 90%).   Comment: Morphologic features of the neoplasm are most consistent with metastasis from a primary small cell carcinoma in the lung.    12/03/2021 -  Chemotherapy   Patient is on Treatment Plan : LUNG SMALL CELL Carboplatin D1 / Etoposide D1-3 q21d  12/03/2021 -  Chemotherapy   Patient is on Treatment Plan :  LUNG SMALL CELL Carboplatin D1 / Etoposide D1-3 q21d     12/09/2021 Cancer Staging   Staging form: Lung, AJCC 8th Edition - Clinical: Stage IV (cT4, cN3, pM1) - Signed by [name], MD on 12/09/2021    01/14/2022 PET scan   1. Since the CTs of 11/26/2021, response to therapy of widespread metastatic small-cell carcinoma as detailed above. Near complete  resolution right upper lobe primary with significant improvement in thoracic nodal, adrenal, abdominal nodal, and peritoneal metastasis. 2. No new or progressive disease. 3. Development of left-sided hypermetabolic pulmonary opacity. Suspicious for infection.       PHYSICAL EXAMINATION: ECOG PERFORMANCE STATUS: 2 - Symptomatic, <50% confined to bed  Vitals:   01/19/22 1012  BP: (!) 141/75  Pulse: 89  Resp: 18  Temp: 98.1 F (36.7 C)  SpO2: 92%   Filed Weights   01/19/22 1012  Weight: 198 lb 6.4 oz (90 kg)    GENERAL:alert, no distress and comfortable SKIN: skin color, texture, turgor are normal, no rashes or significant lesions EYES: normal, Conjunctiva are pink and non-injected, sclera clear OROPHARYNX:no exudate, no erythema and lips, buccal mucosa, and tongue normal  NECK: supple, thyroid normal size, non-tender, without nodularity LYMPH:  no palpable lymphadenopathy in the cervical, axillary or inguinal LUNGS: clear to auscultation and percussion with normal breathing effort HEART: regular rate & rhythm and no murmurs and no lower extremity edema ABDOMEN:abdomen soft, non-tender and normal bowel sounds Musculoskeletal:no cyanosis of digits and no clubbing  NEURO: alert & oriented x 3 with fluent speech, no focal motor/sensory deficits  LABORATORY DATA:  I have reviewed the data as listed    Component Value Date/Time   NA 136 01/19/2022 0940   K 3.8 01/19/2022 0940   CL 100 01/19/2022 0940   CO2 30 01/19/2022 0940   GLUCOSE 185 (H) 01/19/2022 0940   BUN 27 (H) 01/19/2022 0940   CREATININE 0.53 (L) 01/19/2022 0940   CREATININE 0.55 (L) 12/19/2021 1426   CALCIUM 9.2 01/19/2022 0940   PROT 6.6 01/19/2022 0940   ALBUMIN 3.5 01/19/2022 0940   AST 11 (L) 01/19/2022 0940   AST 16 12/19/2021 1426   ALT 24 01/19/2022 0940   ALT 48 (H) 12/19/2021 1426   ALKPHOS 105 01/19/2022 0940   BILITOT 0.6 01/19/2022 0940   BILITOT 0.5 12/19/2021 1426   GFRNONAA >60 01/19/2022  0940   GFRNONAA >60 12/19/2021 1426   GFRAA >60 01/01/2019 0726    No results found for: SPEP, UPEP  Lab Results  Component Value Date   WBC 14.5 (H) 01/19/2022   NEUTROABS 12.2 (H) 01/19/2022   HGB 10.6 (L) 01/19/2022   HCT 32.0 (L) 01/19/2022   MCV 97.3 01/19/2022   PLT 338 01/19/2022      Chemistry      Component Value Date/Time   NA 136 01/19/2022 0940   K 3.8 01/19/2022 0940   CL 100 01/19/2022 0940   CO2 30 01/19/2022 0940   BUN 27 (H) 01/19/2022 0940   CREATININE 0.53 (L) 01/19/2022 0940   CREATININE 0.55 (L) 12/19/2021 1426      Component Value Date/Time   CALCIUM 9.2 01/19/2022 0940   ALKPHOS 105 01/19/2022 0940   AST 11 (L) 01/19/2022 0940   AST 16 12/19/2021 1426   ALT 24 01/19/2022 0940   ALT 48 (H) 12/19/2021 1426   BILITOT 0.6 01/19/2022 0940   BILITOT 0.5 12/19/2021 1426  RADIOGRAPHIC STUDIES: I have reviewed multiple imaging studies with the patient and family I have personally reviewed the radiological images as listed and agreed with the findings in the report. NM PET Image Initial (PI) Skull Base To Thigh  Result Date: 01/13/2022 CLINICAL DATA:  Initial treatment strategy for small-cell lung cancer. EXAM: NUCLEAR MEDICINE PET SKULL BASE TO THIGH TECHNIQUE: 9.4 mCi F-18 FDG was injected intravenously. Full-ring PET imaging was performed from the skull base to thigh after the radiotracer. CT data was obtained and used for attenuation correction and anatomic localization. Fasting blood glucose: 175 mg/dl COMPARISON:  11/26/2021 chest abdomen and pelvic CTs. FINDINGS: Mediastinal blood pool activity: SUV max 2.7 Liver activity: SUV max NA NECK: Right vocal cord hypermetabolism may relate to left-sided paralysis, but is nonspecific. No well-defined mass in this area. No cervical nodal hypermetabolism. Incidental CT findings: Bilateral carotid atherosclerosis. No cervical adenopathy. CHEST: Right paratracheal node measures 2.4 cm and a S.U.V. max of 4.4  on 83/4. This is significantly decreased from on the order of 6.5 cm on the prior CT (when remeasured). Other areas of confluent mediastinal adenopathy have significantly improved. The presumed right upper lobe primary has nearly completely resolved, with only linear opacity remaining including on 16/8. Low-level hypermetabolism at a S.U.V. max of 2.2. Development of posterior left upper and superior segment left lower lobe septal thickening and ground-glass opacity with hypermetabolism. Example at a S.U.V. max of 4.0 including on 35/8. Incidental CT findings: Centrilobular emphysema. Right Port-A-Cath tip superior caval/atrial junction. Aortic and coronary artery calcification. ABDOMEN/PELVIS: Right adrenal nodule measures 1.9 cm and a S.U.V. max of 4.6 on 118/4. Similar in size 2.0 cm on the prior. The left adrenal nodule is decreased in size and there is no hypermetabolism in this area. Gastrohepatic ligament node measures 2.5 cm and a S.U.V. max of 3.2 on 125/4 versus 5.4 cm on the prior CT (when remeasured). An omental 1.6 cm nodule measures a S.U.V. max of 3.1 on 141/4 versus 4.4 cm on the prior CT (when remeasured). Incidental CT findings: Hepatic cysts. Other liver lesions are not definitely simple cysts, but are without correlate hypermetabolism. Example hepatic dome at 1.0 cm on 103/4, similar to on the prior diagnostic CT. Abdominal aortic atherosclerosis.  Prostatomegaly. SKELETON: No abnormal marrow activity. Incidental CT findings: none IMPRESSION: 1. Since the CTs of 11/26/2021, response to therapy of widespread metastatic small-cell carcinoma as detailed above. Near complete resolution right upper lobe primary with significant improvement in thoracic nodal, adrenal, abdominal nodal, and peritoneal metastasis. 2. No new or progressive disease. 3. Development of left-sided hypermetabolic pulmonary opacity. Suspicious for infection. Electronically Signed   By: Abigail Miyamoto M.D.   On: 01/13/2022 15:24

## 2022-01-19 NOTE — Assessment & Plan Note (Signed)
This is resolved

## 2022-01-19 NOTE — Assessment & Plan Note (Signed)
I have reviewed multiple imaging studies with the patient and his significant other He has excellent response to therapy with near complete response We will continue 2 more cycles of treatment with addition of checkpoint inhibitors We will wean him off dexamethasone I plan to repeat imaging study again in March for further follow-up

## 2022-01-19 NOTE — Progress Notes (Signed)
Nutrition follow-up completed with patient during infusion for small cell lung cancer with mets to brain.  Weight improved and documented as 198.4 pounds January 30.  This is increased from 189.5 pounds January 9. Patient denies nutrition impact symptoms. Reports his taste has returned. He is drinking 4 Ensure Plus a day and has started to eat eggs and bacon.  Nutrition diagnosis: Food and nutrition related knowledge deficit stable.  Intervention: Educated patient to continue strategies for increasing oral intake of high-calorie, high-protein foods. Drink oral nutrition supplements as needed to strive for weight maintenance. Continue increased fluid intake.  Monitoring, evaluation, goals: Patient will continue to tolerate adequate calories and protein to minimize weight loss.  Next visit: To be scheduled 4 to 6 weeks with treatment.  **Disclaimer: This note was dictated with voice recognition software. Similar sounding words can inadvertently be transcribed and this note may contain transcription errors which may not have been corrected upon publication of note.**

## 2022-01-19 NOTE — Assessment & Plan Note (Signed)
His blood sugar is better controlled with reduced dose dexamethasone Monitor closely

## 2022-01-19 NOTE — Patient Instructions (Signed)
[facility] ONCOLOGY   Discharge Instructions: Thank you for choosing [facility] to provide your oncology and hematology care.   If you have a lab appointment with the [facility], please go directly to the [facility] and check in at the registration area.   Wear comfortable clothing and clothing appropriate for easy access to any Portacath or PICC line.   We strive to give you quality time with your provider. You may need to reschedule your appointment if you arrive late (15 or more minutes).  Arriving late affects you and other patients whose appointments are after yours.  Also, if you miss three or more appointments without notifying the office, you may be dismissed from the clinic at the providers discretion.      For prescription refill requests, have your pharmacy contact our office and allow 72 hours for refills to be completed.    Today you received the following chemotherapy and/or immunotherapy agents: Atezolizumab (Tecentriq), Etoposide, and Carboplatin      To help prevent nausea and vomiting after your treatment, we encourage you to take your nausea medication as directed.  BELOW ARE SYMPTOMS THAT SHOULD BE REPORTED IMMEDIATELY: *FEVER GREATER THAN 100.4 F (38 C) OR HIGHER *CHILLS OR SWEATING *NAUSEA AND VOMITING THAT IS NOT CONTROLLED WITH YOUR NAUSEA MEDICATION *UNUSUAL SHORTNESS OF BREATH *UNUSUAL BRUISING OR BLEEDING *URINARY PROBLEMS (pain or burning when urinating, or frequent urination) *BOWEL PROBLEMS (unusual diarrhea, constipation, pain near the anus) TENDERNESS IN MOUTH AND THROAT WITH OR WITHOUT PRESENCE OF ULCERS (sore throat, sores in mouth, or a toothache) UNUSUAL RASH, SWELLING OR PAIN  UNUSUAL VAGINAL DISCHARGE OR ITCHING   Items with * indicate a potential emergency and should be followed up as soon as possible or go to the Emergency Department if any problems should occur.  Please show the CHEMOTHERAPY ALERT  CARD or IMMUNOTHERAPY ALERT CARD at check-in to the Emergency Department and triage nurse.  Should you have questions after your visit or need to cancel or reschedule your appointment, please contact [facility]  Dept: [phone]  and follow the prompts.  Office hours are 8:00 a.m. to 4:30 p.m. Monday - Friday. Please note that voicemails left after 4:00 p.m. may not be returned until the following business day.  We are closed weekends and major holidays. You have access to a nurse at all times for urgent questions. Please call the main number to the clinic Dept: [phone] and follow the prompts.   For any non-urgent questions, you may also contact your provider using MyChart. We now offer e-Visits for anyone 72 and older to request care online for non-urgent symptoms. For details visit [email or web address].   Also download the MyChart app! Go to the app store, search "MyChart", open the app, select [facility], and log in with your MyChart username and password.  Due to Covid, a mask is required upon entering the hospital/clinic. If you do not have a mask, one will be given to you upon arrival. For doctor visits, patients may have 1 support person aged 68 or older with them. For treatment visits, patients cannot have anyone with them due to current Covid guidelines and our immunocompromised population.

## 2022-01-20 ENCOUNTER — Telehealth: Payer: Self-pay

## 2022-01-20 ENCOUNTER — Inpatient Hospital Stay: Payer: BC Managed Care – PPO

## 2022-01-20 ENCOUNTER — Encounter: Payer: Self-pay | Admitting: Hematology and Oncology

## 2022-01-20 VITALS — BP 123/69 | HR 84 | Temp 97.7°F | Resp 18

## 2022-01-20 DIAGNOSIS — C7931 Secondary malignant neoplasm of brain: Secondary | ICD-10-CM

## 2022-01-20 DIAGNOSIS — C3491 Malignant neoplasm of unspecified part of right bronchus or lung: Secondary | ICD-10-CM

## 2022-01-20 MED ORDER — SODIUM CHLORIDE 0.9 % IV SOLN: Freq: Once | INTRAVENOUS | Status: AC

## 2022-01-20 MED ORDER — HEPARIN SOD (PORK) LOCK FLUSH 100 UNIT/ML IV SOLN
500.0000 [IU] | Freq: Once | INTRAVENOUS | Status: AC | PRN
  Administered 2022-01-20: 500 [IU]

## 2022-01-20 MED ORDER — SODIUM CHLORIDE 0.9 % IV SOLN
10.0000 mg | Freq: Once | INTRAVENOUS | Status: AC
  Administered 2022-01-20: 10 mg via INTRAVENOUS
  Filled 2022-01-20: qty 10

## 2022-01-20 MED ORDER — SODIUM CHLORIDE 0.9% FLUSH
10.0000 mL | INTRAVENOUS | Status: DC | PRN
  Administered 2022-01-20: 10 mL

## 2022-01-20 MED ORDER — SODIUM CHLORIDE 0.9 % IV SOLN
100.0000 mg/m2 | Freq: Once | INTRAVENOUS | Status: AC
  Administered 2022-01-20: 210 mg via INTRAVENOUS
  Filled 2022-01-20: qty 10.5

## 2022-01-20 MED FILL — Dexamethasone Sodium Phosphate Inj 100 MG/10ML: INTRAMUSCULAR | Qty: 1 | Status: AC

## 2022-01-20 NOTE — Telephone Encounter (Signed)
[name] called and left a message. Ryan [name] has a dentist appt 2/2. What x-ray do you want him to get at the dental visit? She ask that we fax the xray order to the dentist office.

## 2022-01-20 NOTE — Telephone Encounter (Signed)
This is before zometa, to rule out dental issues All dentists should know what to do

## 2022-01-20 NOTE — Patient Instructions (Signed)
[facility] ONCOLOGY  Discharge Instructions: Thank you for choosing [facility] to provide your oncology and hematology care.   If you have a lab appointment with the [facility], please go directly to the [facility] and check in at the registration area.   Wear comfortable clothing and clothing appropriate for easy access to any Portacath or PICC line.   We strive to give you quality time with your provider. You may need to reschedule your appointment if you arrive late (15 or more minutes).  Arriving late affects you and other patients whose appointments are after yours.  Also, if you miss three or more appointments without notifying the office, you may be dismissed from the clinic at the providers discretion.      For prescription refill requests, have your pharmacy contact our office and allow 72 hours for refills to be completed.    Today you received the following chemotherapy and/or immunotherapy agents Etoposide      To help prevent nausea and vomiting after your treatment, we encourage you to take your nausea medication as directed.  BELOW ARE SYMPTOMS THAT SHOULD BE REPORTED IMMEDIATELY: *FEVER GREATER THAN 100.4 F (38 C) OR HIGHER *CHILLS OR SWEATING *NAUSEA AND VOMITING THAT IS NOT CONTROLLED WITH YOUR NAUSEA MEDICATION *UNUSUAL SHORTNESS OF BREATH *UNUSUAL BRUISING OR BLEEDING *URINARY PROBLEMS (pain or burning when urinating, or frequent urination) *BOWEL PROBLEMS (unusual diarrhea, constipation, pain near the anus) TENDERNESS IN MOUTH AND THROAT WITH OR WITHOUT PRESENCE OF ULCERS (sore throat, sores in mouth, or a toothache) UNUSUAL RASH, SWELLING OR PAIN  UNUSUAL VAGINAL DISCHARGE OR ITCHING   Items with * indicate a potential emergency and should be followed up as soon as possible or go to the Emergency Department if any problems should occur.  Please show the CHEMOTHERAPY ALERT CARD or IMMUNOTHERAPY ALERT CARD at check-in to  the Emergency Department and triage nurse.  Should you have questions after your visit or need to cancel or reschedule your appointment, please contact [facility]  Dept: [phone]  and follow the prompts.  Office hours are 8:00 a.m. to 4:30 p.m. Monday - Friday. Please note that voicemails left after 4:00 p.m. may not be returned until the following business day.  We are closed weekends and major holidays. You have access to a nurse at all times for urgent questions. Please call the main number to the clinic Dept: [phone] and follow the prompts.   For any non-urgent questions, you may also contact your provider using MyChart. We now offer e-Visits for anyone 59 and older to request care online for non-urgent symptoms. For details visit [email or web address].   Also download the MyChart app! Go to the app store, search "MyChart", open the app, select [facility], and log in with your MyChart username and password.  Due to Covid, a mask is required upon entering the hospital/clinic. If you do not have a mask, one will be given to you upon arrival. For doctor visits, patients may have 1 support person aged 44 or older with them. For treatment visits, patients cannot have anyone with them due to current Covid guidelines and our immunocompromised population.

## 2022-01-20 NOTE — Telephone Encounter (Signed)
Called and left below message from Dr. Alvy Bimler. Ask her to call the office for questions.

## 2022-01-21 ENCOUNTER — Other Ambulatory Visit: Payer: Self-pay

## 2022-01-21 ENCOUNTER — Inpatient Hospital Stay: Payer: BC Managed Care – PPO | Attending: Hematology and Oncology

## 2022-01-21 VITALS — BP 120/62 | HR 89 | Temp 97.7°F | Resp 19

## 2022-01-21 DIAGNOSIS — Z5189 Encounter for other specified aftercare: Secondary | ICD-10-CM | POA: Insufficient documentation

## 2022-01-21 DIAGNOSIS — R0600 Dyspnea, unspecified: Secondary | ICD-10-CM | POA: Diagnosis not present

## 2022-01-21 DIAGNOSIS — R630 Anorexia: Secondary | ICD-10-CM | POA: Diagnosis not present

## 2022-01-21 DIAGNOSIS — C3491 Malignant neoplasm of unspecified part of right bronchus or lung: Secondary | ICD-10-CM | POA: Diagnosis not present

## 2022-01-21 DIAGNOSIS — C7970 Secondary malignant neoplasm of unspecified adrenal gland: Secondary | ICD-10-CM | POA: Insufficient documentation

## 2022-01-21 DIAGNOSIS — C7931 Secondary malignant neoplasm of brain: Secondary | ICD-10-CM | POA: Insufficient documentation

## 2022-01-21 DIAGNOSIS — C786 Secondary malignant neoplasm of retroperitoneum and peritoneum: Secondary | ICD-10-CM | POA: Diagnosis not present

## 2022-01-21 DIAGNOSIS — Z5111 Encounter for antineoplastic chemotherapy: Secondary | ICD-10-CM | POA: Insufficient documentation

## 2022-01-21 DIAGNOSIS — R0602 Shortness of breath: Secondary | ICD-10-CM | POA: Insufficient documentation

## 2022-01-21 DIAGNOSIS — R5381 Other malaise: Secondary | ICD-10-CM | POA: Diagnosis not present

## 2022-01-21 DIAGNOSIS — R634 Abnormal weight loss: Secondary | ICD-10-CM | POA: Diagnosis not present

## 2022-01-21 DIAGNOSIS — C778 Secondary and unspecified malignant neoplasm of lymph nodes of multiple regions: Secondary | ICD-10-CM | POA: Insufficient documentation

## 2022-01-21 MED ORDER — SODIUM CHLORIDE 0.9% FLUSH
10.0000 mL | INTRAVENOUS | Status: DC | PRN
  Administered 2022-01-21: 10 mL

## 2022-01-21 MED ORDER — HEPARIN SOD (PORK) LOCK FLUSH 100 UNIT/ML IV SOLN
500.0000 [IU] | Freq: Once | INTRAVENOUS | Status: AC | PRN
  Administered 2022-01-21: 500 [IU]

## 2022-01-21 MED ORDER — SODIUM CHLORIDE 0.9 % IV SOLN
10.0000 mg | Freq: Once | INTRAVENOUS | Status: AC
  Administered 2022-01-21: 10 mg via INTRAVENOUS
  Filled 2022-01-21: qty 10

## 2022-01-21 MED ORDER — SODIUM CHLORIDE 0.9 % IV SOLN
100.0000 mg/m2 | Freq: Once | INTRAVENOUS | Status: AC
  Administered 2022-01-21: 210 mg via INTRAVENOUS
  Filled 2022-01-21: qty 10.5

## 2022-01-21 MED ORDER — SODIUM CHLORIDE 0.9 % IV SOLN: Freq: Once | INTRAVENOUS | Status: AC

## 2022-01-21 NOTE — Patient Instructions (Signed)
[facility]  Discharge Instructions: Thank you for choosing [facility] to provide your oncology and hematology care.   If you have a lab appointment with the [facility], please go directly to the [facility] and check in at the registration area.   Wear comfortable clothing and clothing appropriate for easy access to any Portacath or PICC line.   We strive to give you quality time with your provider. You may need to reschedule your appointment if you arrive late (15 or more minutes).  Arriving late affects you and other patients whose appointments are after yours.  Also, if you miss three or more appointments without notifying the office, you may be dismissed from the clinic at the providers discretion.      For prescription refill requests, have your pharmacy contact our office and allow 72 hours for refills to be completed.    Today you received the following chemotherapy and/or immunotherapy agents: Etoposide      To help prevent nausea and vomiting after your treatment, we encourage you to take your nausea medication as directed.  BELOW ARE SYMPTOMS THAT SHOULD BE REPORTED IMMEDIATELY: *FEVER GREATER THAN 100.4 F (38 C) OR HIGHER *CHILLS OR SWEATING *NAUSEA AND VOMITING THAT IS NOT CONTROLLED WITH YOUR NAUSEA MEDICATION *UNUSUAL SHORTNESS OF BREATH *UNUSUAL BRUISING OR BLEEDING *URINARY PROBLEMS (pain or burning when urinating, or frequent urination) *BOWEL PROBLEMS (unusual diarrhea, constipation, pain near the anus) TENDERNESS IN MOUTH AND THROAT WITH OR WITHOUT PRESENCE OF ULCERS (sore throat, sores in mouth, or a toothache) UNUSUAL RASH, SWELLING OR PAIN  UNUSUAL VAGINAL DISCHARGE OR ITCHING   Items with * indicate a potential emergency and should be followed up as soon as possible or go to the Emergency Department if any problems should occur.  Please show the CHEMOTHERAPY ALERT CARD or IMMUNOTHERAPY ALERT CARD at check-in to  the Emergency Department and triage nurse.  Should you have questions after your visit or need to cancel or reschedule your appointment, please contact [facility]  Dept: [phone]  and follow the prompts.  Office hours are 8:00 a.m. to 4:30 p.m. Monday - Friday. Please note that voicemails left after 4:00 p.m. may not be returned until the following business day.  We are closed weekends and major holidays. You have access to a nurse at all times for urgent questions. Please call the main number to the clinic Dept: [phone] and follow the prompts.   For any non-urgent questions, you may also contact your provider using MyChart. We now offer e-Visits for anyone 33 and older to request care online for non-urgent symptoms. For details visit [email or web address].   Also download the MyChart app! Go to the app store, search "MyChart", open the app, select [facility], and log in with your MyChart username and password.  Due to Covid, a mask is required upon entering the hospital/clinic. If you do not have a mask, one will be given to you upon arrival. For doctor visits, patients may have 1 support person aged 41 or older with them. For treatment visits, patients cannot have anyone with them due to current Covid guidelines and our immunocompromised population.

## 2022-01-22 ENCOUNTER — Emergency Department (HOSPITAL_BASED_OUTPATIENT_CLINIC_OR_DEPARTMENT_OTHER)
Admission: EM | Admit: 2022-01-22 | Discharge: 2022-01-22 | Disposition: A | Discharge status: Home / Self Care | Payer: BC Managed Care – PPO | Attending: Emergency Medicine | Admitting: Emergency Medicine

## 2022-01-22 ENCOUNTER — Encounter (HOSPITAL_BASED_OUTPATIENT_CLINIC_OR_DEPARTMENT_OTHER): Payer: Self-pay | Admitting: *Deleted

## 2022-01-22 ENCOUNTER — Telehealth: Payer: Self-pay

## 2022-01-22 ENCOUNTER — Ambulatory Visit
Admission: RE | Admit: 2022-01-22 | Discharge: 2022-01-22 | Disposition: A | Discharge status: Home / Self Care | Payer: BC Managed Care – PPO | Source: Ambulatory Visit | Attending: Urology | Admitting: Urology

## 2022-01-22 ENCOUNTER — Other Ambulatory Visit: Payer: Self-pay

## 2022-01-22 DIAGNOSIS — Z85118 Personal history of other malignant neoplasm of bronchus and lung: Secondary | ICD-10-CM | POA: Insufficient documentation

## 2022-01-22 DIAGNOSIS — K5900 Constipation, unspecified: Secondary | ICD-10-CM | POA: Diagnosis present

## 2022-01-22 DIAGNOSIS — C7931 Secondary malignant neoplasm of brain: Secondary | ICD-10-CM

## 2022-01-22 DIAGNOSIS — C3491 Malignant neoplasm of unspecified part of right bronchus or lung: Secondary | ICD-10-CM

## 2022-01-22 NOTE — Progress Notes (Incomplete)
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  12/02/21 - 12/16/21:  The whole brain was treated to 30 Gy in 10 fractions of 3 Gy  Narrative:  I spoke with the patient to conduct his routine scheduled 1 month follow up visit via telephone to spare the patient unnecessary potential exposure in the healthcare setting during the current COVID-19 pandemic.  The patient was notified in advance and gave permission to proceed with this visit format.   He tolerated radiation treatment relatively well with only mild fatigue.                               On review of systems, the patient states ***  ALLERGIES:  is allergic to hydralazine and percocet [oxycodone-acetaminophen].  Meds: Current Outpatient Medications  Medication Sig Dispense Refill   atorvastatin (LIPITOR) 20 MG tablet Take 20 mg by mouth daily.     lidocaine-prilocaine (EMLA) cream Apply 1 application topically as needed. 30 g 3   memantine (NAMENDA) 10 MG tablet Take 1 tablet (10 mg total) by mouth 2 (two) times daily. 60 tablet 4   mirtazapine (REMERON) 30 MG tablet Take 1 tablet (30 mg total) by mouth at bedtime. 90 tablet 1   ondansetron (ZOFRAN) 8 MG tablet Take 1 tablet (8 mg total) by mouth every 8 (eight) hours as needed for nausea or vomiting. 30 tablet 1   pantoprazole (PROTONIX) 40 MG tablet Take 1 tablet (40 mg total) by mouth daily. 30 tablet 2   No current facility-administered medications for this encounter.    Physical Findings:  vitals  were not taken for this visit.  Pain Assessment Pain Score: 0-No pain/10 Unable to assess due to telephone follow up visit format.  Lab Findings: Lab Results  Component Value Date   WBC 14.5 (H) 01/19/2022   HGB 10.6 (L) 01/19/2022   HCT 32.0 (L) 01/19/2022   MCV 97.3 01/19/2022   PLT 338 01/19/2022     Radiographic Findings: NM PET Image Initial (PI) Skull Base To Thigh  Result Date: 01/13/2022 CLINICAL DATA:  Initial treatment strategy for small-cell lung cancer. EXAM: NUCLEAR MEDICINE PET SKULL BASE TO THIGH TECHNIQUE: 9.4 mCi F-18 FDG was injected intravenously. Full-ring PET imaging was performed from the skull base to thigh after the radiotracer. CT data was obtained and used for attenuation correction and anatomic localization. Fasting blood glucose: 175 mg/dl COMPARISON:  11/26/2021 chest abdomen and pelvic CTs. FINDINGS: Mediastinal blood pool activity: SUV max 2.7 Liver activity: SUV max NA NECK: Right vocal cord hypermetabolism may relate to left-sided paralysis, but is nonspecific. No well-defined mass in this area. No cervical nodal hypermetabolism. Incidental CT findings: Bilateral carotid atherosclerosis. No cervical adenopathy. CHEST: Right paratracheal node measures 2.4 cm and a S.U.V. max of 4.4 on 83/4. This is significantly decreased from on the order of 6.5 cm on the prior CT (when remeasured). Other areas of  confluent mediastinal adenopathy have significantly improved. The presumed right upper lobe primary has nearly completely resolved, with only linear opacity remaining including on 16/8. Low-level hypermetabolism at a S.U.V. max of 2.2. Development of posterior left upper and superior segment left lower lobe septal thickening and ground-glass opacity with hypermetabolism. Example at a S.U.V. max of 4.0 including on 35/8. Incidental CT findings: Centrilobular emphysema. Right Port-A-Cath tip superior caval/atrial junction. Aortic and coronary artery calcification.  ABDOMEN/PELVIS: Right adrenal nodule measures 1.9 cm and a S.U.V. max of 4.6 on 118/4. Similar in size 2.0 cm on the prior. The left adrenal nodule is decreased in size and there is no hypermetabolism in this area. Gastrohepatic ligament node measures 2.5 cm and a S.U.V. max of 3.2 on 125/4 versus 5.4 cm on the prior CT (when remeasured). An omental 1.6 cm nodule measures a S.U.V. max of 3.1 on 141/4 versus 4.4 cm on the prior CT (when remeasured). Incidental CT findings: Hepatic cysts. Other liver lesions are not definitely simple cysts, but are without correlate hypermetabolism. Example hepatic dome at 1.0 cm on 103/4, similar to on the prior diagnostic CT. Abdominal aortic atherosclerosis.  Prostatomegaly. SKELETON: No abnormal marrow activity. Incidental CT findings: none IMPRESSION: 1. Since the CTs of 11/26/2021, response to therapy of widespread metastatic small-cell carcinoma as detailed above. Near complete resolution right upper lobe primary with significant improvement in thoracic nodal, adrenal, abdominal nodal, and peritoneal metastasis. 2. No new or progressive disease. 3. Development of left-sided hypermetabolic pulmonary opacity. Suspicious for infection. Electronically Signed   By: Abigail Miyamoto M.D.   On: 01/13/2022 15:24    Impression/Plan: 23. 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

## 2022-01-22 NOTE — ED Notes (Signed)
ED Provider at bedside. 

## 2022-01-22 NOTE — ED Provider Notes (Signed)
[facility] EMERGENCY DEPARTMENT Provider Note   CSN: [id number] Arrival date & time: 01/22/22  [id number]     History  Chief Complaint  Patient presents with   Abdominal Pain    Ryan [name] is a 66 y.o. male.  66 year old male currently on chemo for lung cancer, last treatment yesterday, presents with complaint of constipation with minimal stool output since Tuesday.  Patient takes a fiber supplement daily, took Colace and MiraLAX yesterday.  Reports he is passing gas, denies nausea, vomiting, fevers.  No prior abdominal surgeries, no history of bowel obstruction.  Not on narcotic pain medications.      Home Medications Prior to Admission medications   Medication Sig Start Date End Date Taking? Authorizing Provider  atorvastatin (LIPITOR) 20 MG tablet Take 20 mg by mouth daily. 10/01/21  Yes [provider]  memantine (NAMENDA) 10 MG tablet Take 1 tablet (10 mg total) by mouth 2 (two) times daily. 12/01/21  Yes Hayden Pedro, PA-C  mirtazapine (REMERON) 30 MG tablet Take 1 tablet (30 mg total) by mouth at bedtime. 01/19/22  Yes [name], MD  ondansetron (ZOFRAN) 8 MG tablet Take 1 tablet (8 mg total) by mouth every 8 (eight) hours as needed for nausea or vomiting. 12/08/21  Yes Gorsuch, Ni, MD  pantoprazole (PROTONIX) 40 MG tablet Take 1 tablet (40 mg total) by mouth daily. 12/06/21  Yes Dessa Phi, DO  lidocaine-prilocaine (EMLA) cream Apply 1 application topically as needed. 12/08/21   [name], MD      Allergies    Hydralazine and Percocet [oxycodone-acetaminophen]    Review of Systems   Review of Systems  Constitutional:  Negative for chills and fever.  Respiratory:  Negative for shortness of breath.   Cardiovascular:  Negative for chest pain.  Gastrointestinal:  Positive for abdominal pain and constipation. Negative for blood in stool, diarrhea, nausea and vomiting.  Genitourinary:  Negative for difficulty urinating.  Musculoskeletal:   Negative for arthralgias and myalgias.  Skin:  Negative for wound.  Allergic/Immunologic: Positive for immunocompromised state.  Neurological:  Negative for weakness.  Hematological:  Does not bruise/bleed easily.  Psychiatric/Behavioral:  Negative for confusion.    Physical Exam Updated Vital Signs BP 128/71    Pulse 86    Temp 97.7 F (36.5 C) (Oral)    Resp 20    SpO2 95%  Physical Exam Vitals and nursing note reviewed.  Constitutional:      General: He is not in acute distress.    Appearance: He is well-developed. He is not diaphoretic.  HENT:     Head: Normocephalic and atraumatic.  Cardiovascular:     Rate and Rhythm: Normal rate and regular rhythm.     Heart sounds: Normal heart sounds.  Pulmonary:     Effort: Pulmonary effort is normal.     Breath sounds: Normal breath sounds.  Abdominal:     General: Bowel sounds are decreased.     Palpations: Abdomen is soft.     Tenderness: There is generalized abdominal tenderness.  Skin:    General: Skin is warm and dry.     Findings: No erythema or rash.  Neurological:     Mental Status: He is alert and oriented to person, place, and time.  Psychiatric:        Behavior: Behavior normal.    ED Results / Procedures / Treatments   Labs (all labs ordered are listed, but only abnormal results are displayed) Labs Reviewed - No  data to display   EKG None  Radiology No results found.  Procedures Procedures    Medications Ordered in ED Medications - No data to display  ED Course/ Medical Decision Making/ A&P                           Medical Decision Making  66 year old male with complaint of constipation, last chemo yesterday.  Found generalized abdominal tenderness which is mild, hypoactive bowel sounds.  Plan was for lab evaluation and CT abdomen pelvis for concern for possible obstruction versus metastatic process.  After discussion, patient is going to the bathroom, and had a large bowel movement and is feeling  significantly improved with all symptoms resolved.  He is declining further work-up at this time.  This seems reasonable.  He is to follow-up with his oncology team.  Discussed management with his constipation at home with stool softener, fiber supplement, additional dosing with MiraLAX as needed.        Final Clinical Impression(s) / ED Diagnoses Final diagnoses:  Constipation, unspecified constipation type    Rx / DC Orders ED Discharge Orders     None         Tacy Learn, PA-C 01/22/22 1542    Truddie Hidden, MD 01/24/22 [phone]

## 2022-01-22 NOTE — Progress Notes (Signed)
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  The patient was immobilized with a thermoplastic mask and isocenter was verified with weekly port films.  Narrative: The patient tolerated radiation treatment relatively well and did not experience any ill side effects associated with radiation.  Plan: The patient has completed radiation treatment. The patient will return to radiation oncology clinic for routine followup in one month. I advised them to call or return sooner if they have any questions or concerns related to their recovery or treatment. ________________________________  Sheral Apley. Tammi Klippel, M.D.

## 2022-01-22 NOTE — Progress Notes (Signed)
°  Radiation Oncology         (336) [phone] ________________________________  Name: Neeko Pharo MRN: [id number]  Date: 12/12/2021  DOB: [birth year]   End of Treatment Note  Diagnosis:   66 yo man with Extensive Stage, cT1cN3M1c, small cell lung cancer  of the RUL lung with liver, adrenal, and brain metastases     Indication for treatment:  Palliation       Radiation treatment dates:   12/01/21 - 12/12/21  Site/dose:   The primary tumor in the RUL lung and mediastinal nodes were treated to 30 Gy in 10 fractions of 3 Gy each.  Beams/energy:   A five field 3D conformal treatment arrangement was used delivering 6 and 10 MV photons.  Daily image-guidance CT was used to align the treatment with the targeted volume  Narrative: The patient tolerated radiation treatment relatively well with only mild fatigue.  Plan: The patient has completed radiation treatment. The patient will return to radiation oncology clinic for routine followup in one month. I advised him to call or return sooner if he has any questions or concerns related to his recovery or treatment.  ________________________________  Sheral Apley. Tammi Klippel, M.D.

## 2022-01-22 NOTE — Telephone Encounter (Signed)
Under the direction of [name]. I called patient and spoke w/ spouse '[name]'. She states patient 'Ryan [name]' is currently at the [facility] ED for constipation. They would like to reschedule his 9:30am-01/22/22 telephone appointment w/ [name] for the next 1st available telephone appointment on a later date. I told patient that we understand and would do our best to accommodate their scheduling needs. I left my extension [phone] in case patient needs to call.

## 2022-01-22 NOTE — ED Triage Notes (Signed)
Here for evaluation of constipation, currently is being treated for lung cancer. States taking colace and fiber everyday. Miralax last PM. States he is passing gas.

## 2022-01-22 NOTE — Discharge Instructions (Signed)
Manage your bowels per your oncology teams recommendations. Return to the ER for fevers, inability to pass stool, vomiting, or any other concerning symptoms.

## 2022-01-22 NOTE — ED Notes (Signed)
Has [facility] at [facility], states he had a chemo tx yesterday.

## 2022-01-23 ENCOUNTER — Inpatient Hospital Stay: Payer: BC Managed Care – PPO

## 2022-01-23 ENCOUNTER — Encounter: Payer: Self-pay | Admitting: Hematology and Oncology

## 2022-01-23 DIAGNOSIS — C3491 Malignant neoplasm of unspecified part of right bronchus or lung: Secondary | ICD-10-CM | POA: Diagnosis not present

## 2022-01-23 DIAGNOSIS — C7931 Secondary malignant neoplasm of brain: Secondary | ICD-10-CM

## 2022-01-23 MED ORDER — PEGFILGRASTIM-CBQV 6 MG/0.6ML ~~LOC~~ SOSY
6.0000 mg | PREFILLED_SYRINGE | Freq: Once | SUBCUTANEOUS | Status: AC
  Administered 2022-01-23: 6 mg via SUBCUTANEOUS
  Filled 2022-01-23: qty 0.6

## 2022-01-27 ENCOUNTER — Inpatient Hospital Stay: Payer: BC Managed Care – PPO | Admitting: Hematology and Oncology

## 2022-01-27 ENCOUNTER — Emergency Department (HOSPITAL_COMMUNITY)
Admission: EM | Admit: 2022-01-27 | Discharge: 2022-01-27 | Disposition: A | Discharge status: Home / Self Care | Payer: BC Managed Care – PPO | Attending: Emergency Medicine | Admitting: Emergency Medicine

## 2022-01-27 ENCOUNTER — Emergency Department (HOSPITAL_COMMUNITY): Payer: BC Managed Care – PPO

## 2022-01-27 ENCOUNTER — Encounter (HOSPITAL_COMMUNITY): Payer: Self-pay

## 2022-01-27 ENCOUNTER — Inpatient Hospital Stay: Payer: BC Managed Care – PPO

## 2022-01-27 ENCOUNTER — Telehealth: Payer: Self-pay

## 2022-01-27 ENCOUNTER — Encounter: Payer: Self-pay | Admitting: Hematology and Oncology

## 2022-01-27 ENCOUNTER — Other Ambulatory Visit: Payer: Self-pay | Admitting: Hematology and Oncology

## 2022-01-27 ENCOUNTER — Encounter: Payer: Self-pay | Admitting: Urology

## 2022-01-27 ENCOUNTER — Other Ambulatory Visit: Payer: Self-pay

## 2022-01-27 ENCOUNTER — Ambulatory Visit: Payer: BC Managed Care – PPO

## 2022-01-27 VITALS — BP 108/75 | HR 122 | Temp 97.5°F | Resp 22 | Ht 74.0 in | Wt 185.6 lb

## 2022-01-27 DIAGNOSIS — C7931 Secondary malignant neoplasm of brain: Secondary | ICD-10-CM

## 2022-01-27 DIAGNOSIS — R0600 Dyspnea, unspecified: Secondary | ICD-10-CM

## 2022-01-27 DIAGNOSIS — Z20822 Contact with and (suspected) exposure to covid-19: Secondary | ICD-10-CM | POA: Diagnosis not present

## 2022-01-27 DIAGNOSIS — I7121 Aneurysm of the ascending aorta, without rupture: Secondary | ICD-10-CM

## 2022-01-27 DIAGNOSIS — C3490 Malignant neoplasm of unspecified part of unspecified bronchus or lung: Secondary | ICD-10-CM | POA: Insufficient documentation

## 2022-01-27 DIAGNOSIS — R071 Chest pain on breathing: Secondary | ICD-10-CM | POA: Diagnosis not present

## 2022-01-27 DIAGNOSIS — C3491 Malignant neoplasm of unspecified part of right bronchus or lung: Secondary | ICD-10-CM

## 2022-01-27 DIAGNOSIS — I9589 Other hypotension: Secondary | ICD-10-CM | POA: Diagnosis not present

## 2022-01-27 DIAGNOSIS — R5381 Other malaise: Secondary | ICD-10-CM

## 2022-01-27 DIAGNOSIS — R0602 Shortness of breath: Secondary | ICD-10-CM | POA: Diagnosis present

## 2022-01-27 DIAGNOSIS — R0689 Other abnormalities of breathing: Secondary | ICD-10-CM

## 2022-01-27 LAB — D-DIMER, QUANTITATIVE: D-Dimer, Quant: 1.21 ug/mL-FEU — ABNORMAL HIGH (ref 0.00–0.50)

## 2022-01-27 LAB — CBC WITH DIFFERENTIAL/PLATELET
Abs Immature Granulocytes: 0.16 10*3/uL — ABNORMAL HIGH (ref 0.00–0.07)
Basophils Absolute: 0.1 10*3/uL (ref 0.0–0.1)
Basophils Relative: 0 %
Eosinophils Absolute: 0.1 10*3/uL (ref 0.0–0.5)
Eosinophils Relative: 1 %
HCT: 34.3 % — ABNORMAL LOW (ref 39.0–52.0)
Hemoglobin: 11.4 g/dL — ABNORMAL LOW (ref 13.0–17.0)
Immature Granulocytes: 1 %
Lymphocytes Relative: 5 %
Lymphs Abs: 0.7 10*3/uL (ref 0.7–4.0)
MCH: 31.7 pg (ref 26.0–34.0)
MCHC: 33.2 g/dL (ref 30.0–36.0)
MCV: 95.3 fL (ref 80.0–100.0)
Monocytes Absolute: 0.9 10*3/uL (ref 0.1–1.0)
Monocytes Relative: 6 %
Neutro Abs: 11.8 10*3/uL — ABNORMAL HIGH (ref 1.7–7.7)
Neutrophils Relative %: 87 %
Platelets: 239 10*3/uL (ref 150–400)
RBC: 3.6 MIL/uL — ABNORMAL LOW (ref 4.22–5.81)
RDW: 17.2 % — ABNORMAL HIGH (ref 11.5–15.5)
Smear Review: NORMAL
WBC: 13.7 10*3/uL — ABNORMAL HIGH (ref 4.0–10.5)
nRBC: 0 % (ref 0.0–0.2)

## 2022-01-27 LAB — URINALYSIS, COMPLETE (UACMP) WITH MICROSCOPIC
Bilirubin Urine: NEGATIVE
Glucose, UA: NEGATIVE mg/dL
Hgb urine dipstick: NEGATIVE
Ketones, ur: NEGATIVE mg/dL
Nitrite: NEGATIVE
Protein, ur: 30 mg/dL — AB
Specific Gravity, Urine: 1.03 (ref 1.005–1.030)
pH: 5 (ref 5.0–8.0)

## 2022-01-27 LAB — COMPREHENSIVE METABOLIC PANEL
ALT: 18 U/L (ref 0–44)
AST: 11 U/L — ABNORMAL LOW (ref 15–41)
Albumin: 3.8 g/dL (ref 3.5–5.0)
Alkaline Phosphatase: 160 U/L — ABNORMAL HIGH (ref 38–126)
Anion gap: 11 (ref 5–15)
BUN: 32 mg/dL — ABNORMAL HIGH (ref 8–23)
CO2: 22 mmol/L (ref 22–32)
Calcium: 9.6 mg/dL (ref 8.9–10.3)
Chloride: 104 mmol/L (ref 98–111)
Creatinine, Ser: 0.54 mg/dL — ABNORMAL LOW (ref 0.61–1.24)
GFR, Estimated: >60 mL/min (ref >60)
Glucose, Bld: 145 mg/dL — ABNORMAL HIGH (ref 70–99)
Potassium: 3.2 mmol/L — ABNORMAL LOW (ref 3.5–5.1)
Sodium: 137 mmol/L (ref 135–145)
Total Bilirubin: 1 mg/dL (ref 0.3–1.2)
Total Protein: 7 g/dL (ref 6.5–8.1)

## 2022-01-27 LAB — RESP PANEL BY RT-PCR (FLU A&B, COVID) ARPGX2
Influenza A by PCR: NEGATIVE
Influenza B by PCR: NEGATIVE
SARS Coronavirus 2 by RT PCR: NEGATIVE

## 2022-01-27 LAB — LACTIC ACID, PLASMA: Lactic Acid, Venous: 1.1 mmol/L (ref 0.5–1.9)

## 2022-01-27 LAB — SAMPLE TO BLOOD BANK

## 2022-01-27 IMAGING — DX DG CHEST 1V PORT
1 series · 1 of 1 positions shown · non-contrast
Comparison: [DATE], [DATE]

CLINICAL DATA: Short of breath, small cell lung cancer

EXAM:
PORTABLE CHEST 1 VIEW

[chest ap]
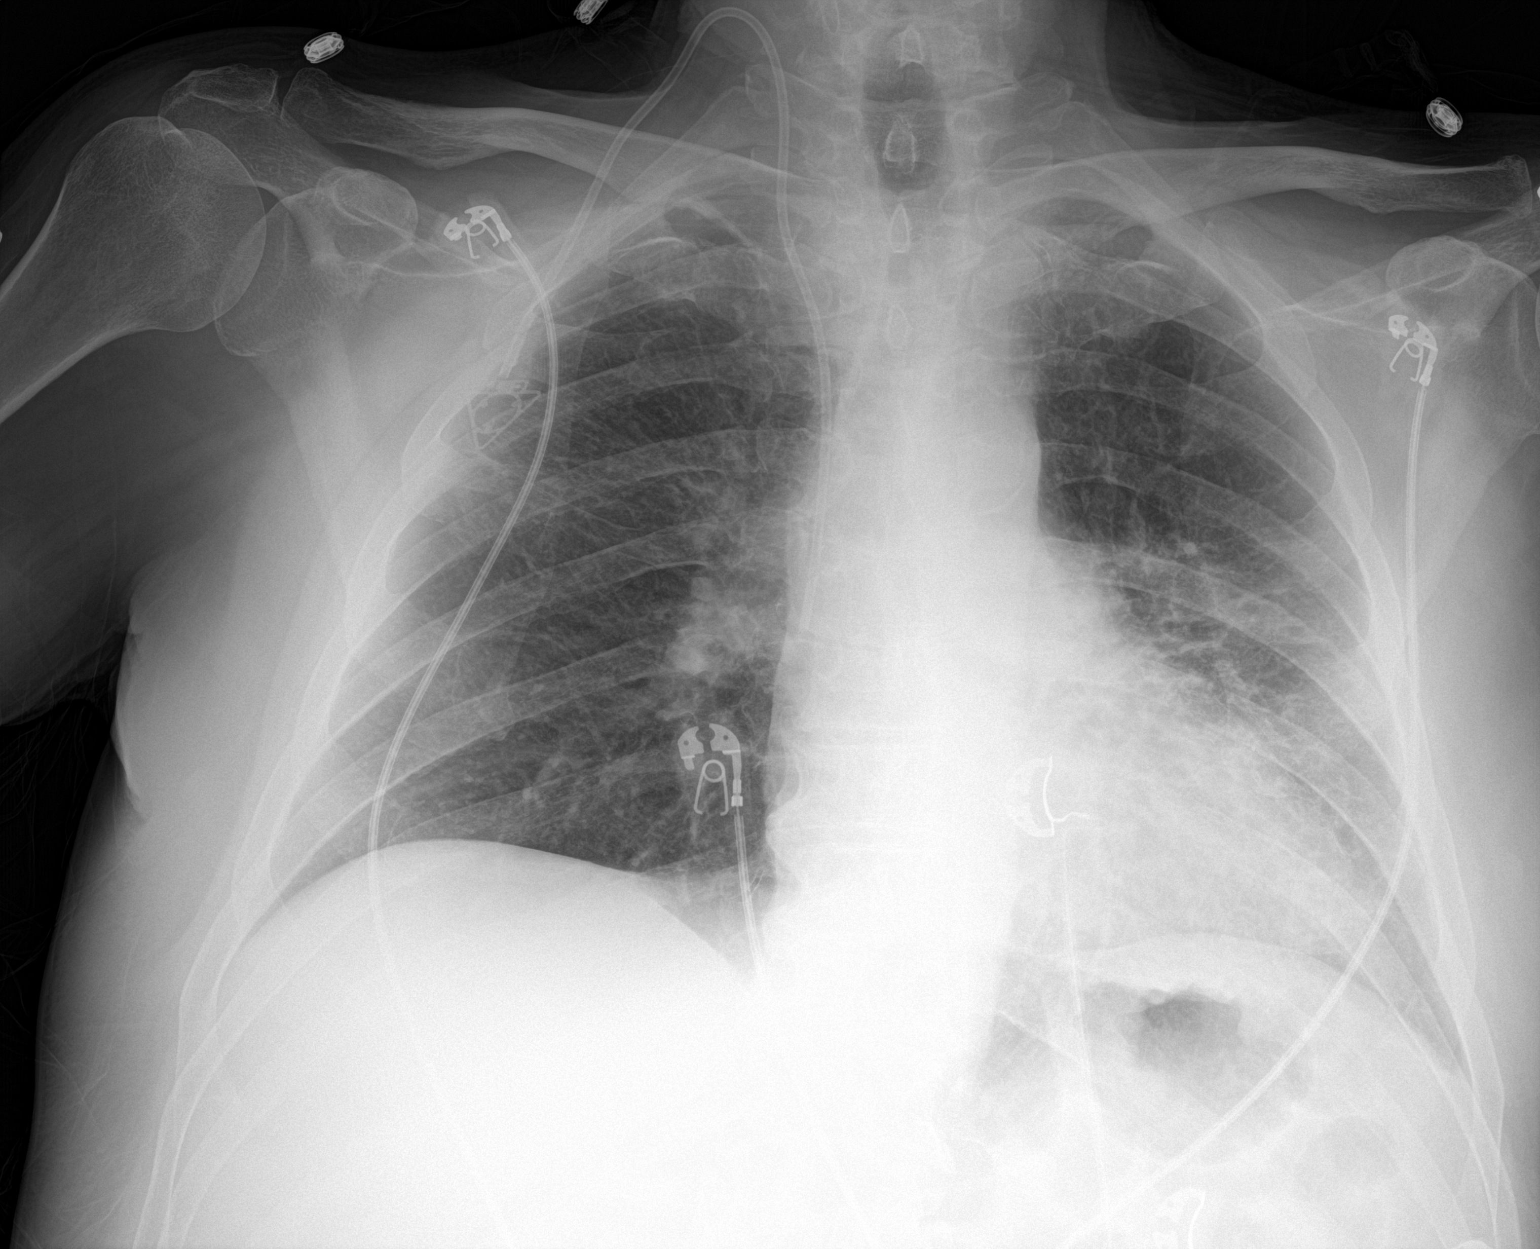

[1 of 1 positions shown; findings below may reference images not displayed]

FINDINGS: Single frontal view of the chest demonstrates right chest wall port
via internal jugular approach tip in the region of the superior vena
cava. Cardiac silhouette is stable. Marked decrease in size of the
right hilar mass consistent with response to therapy. Stable left
lower lobe airspace disease consistent with pneumonia or aspiration.
No effusion or pneumothorax. No acute fractures.
IMPRESSION: 1. Persistent left lower lobe airspace disease, most consistent with
pneumonia. Continued follow-up to resolution after appropriate
medical management is recommended.
2. Decreased right hilar mass, consistent with response to therapy
of known small cell lung cancer.

## 2022-01-27 IMAGING — CT CT ANGIO CHEST
2 of 6 series · 18 of 36 positions shown · IV contrast (agent unspecified)
Comparison: [DATE].

CLINICAL DATA: Shortness of breath.

EXAM:
CT ANGIOGRAPHY CHEST WITH CONTRAST
TECHNIQUE: Multidetector CT imaging of the chest was performed using the
standard protocol during bolus administration of intravenous
contrast. Multiplanar CT image reconstructions and MIPs were
obtained to evaluate the vascular anatomy.

[Series 7: thins · axial · 0.98mm/px · z∈[+1126,+1400]mm · 17 of 310 slices shown]
[im 18/310  lung]
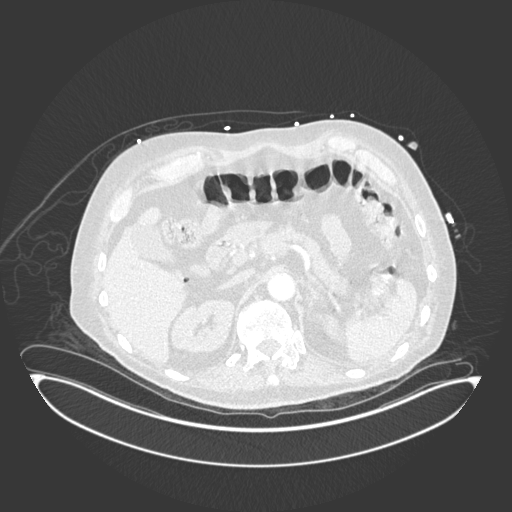
[im 35/310  mediastinal]
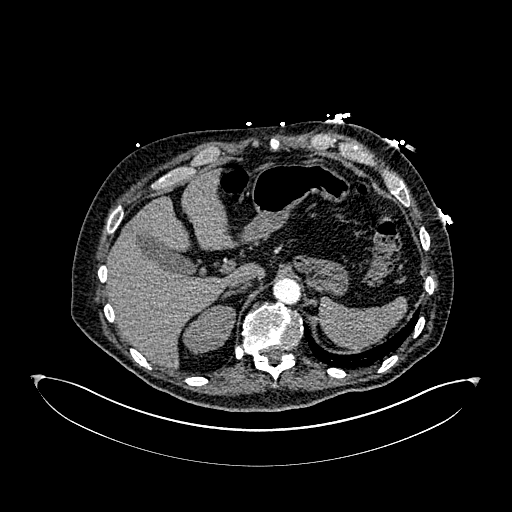
[im 52/310  lung]
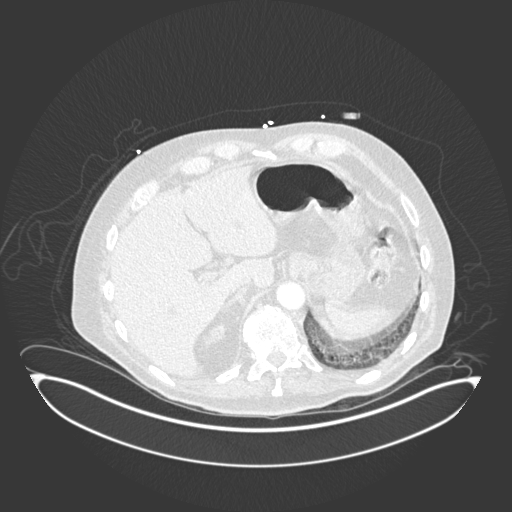
[im 69/310  mediastinal]
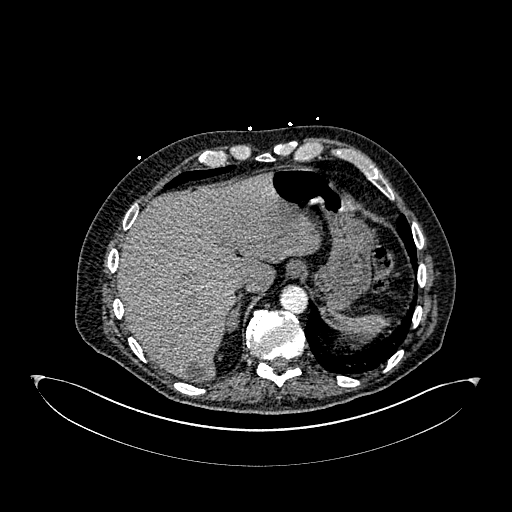
[im 86/310  lung]
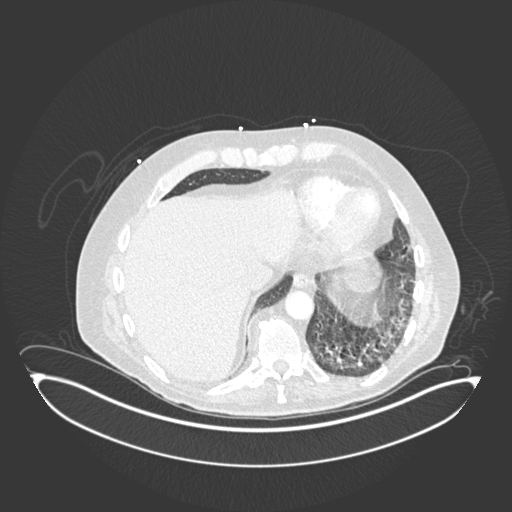
[im 104/310  mediastinal]
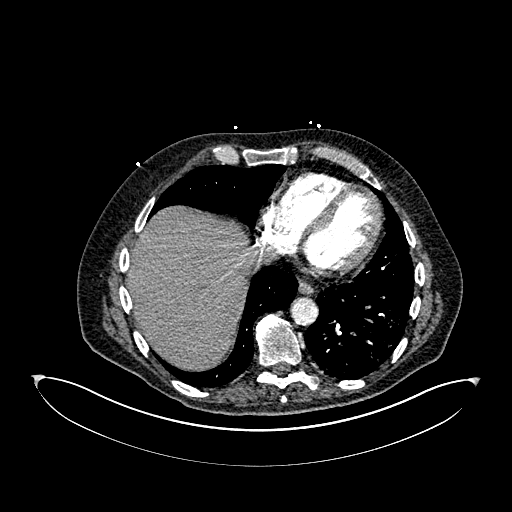
[im 121/310  lung]
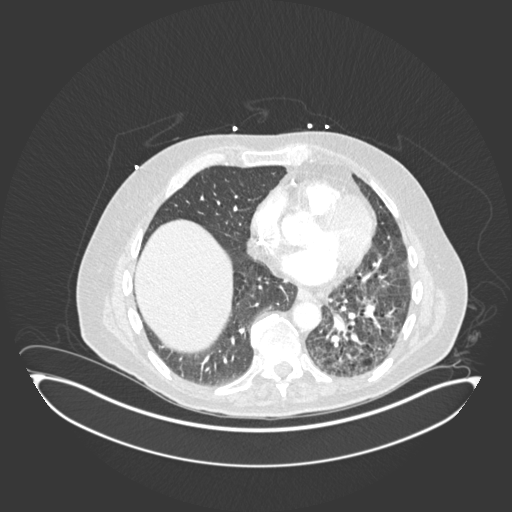
[im 138/310  mediastinal]
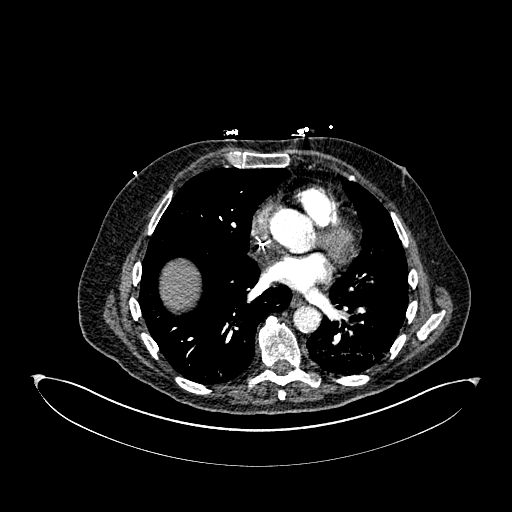
[im 155/310  lung]
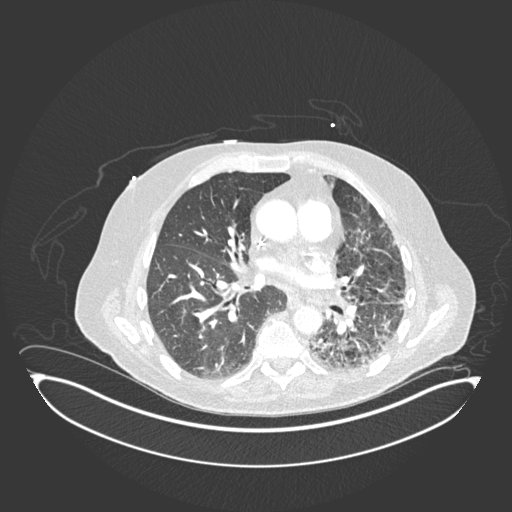
[im 172/310  mediastinal]
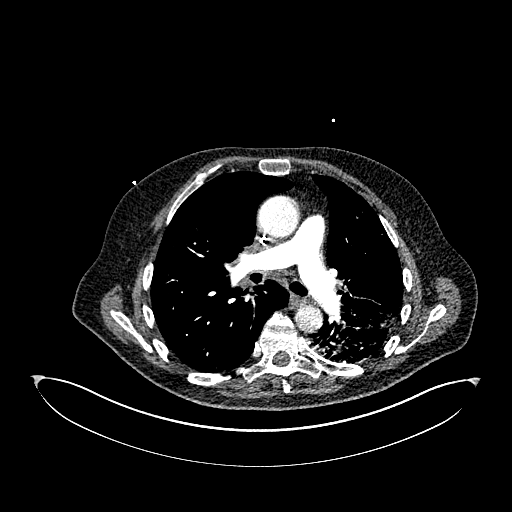
[im 189/310  lung]
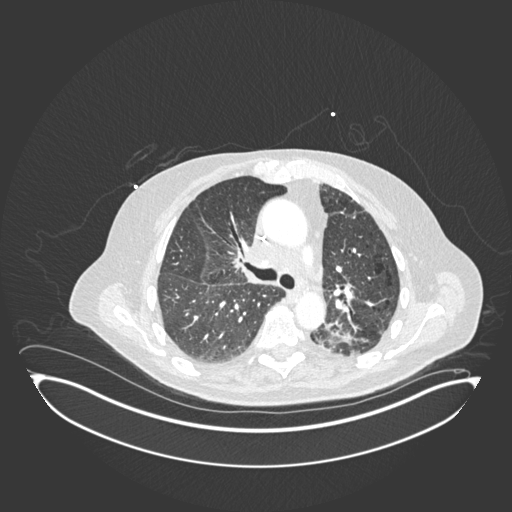
[im 207/310  mediastinal]
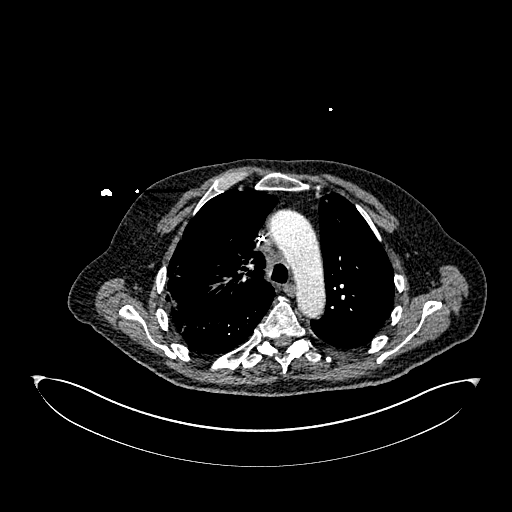
[im 224/310  lung]
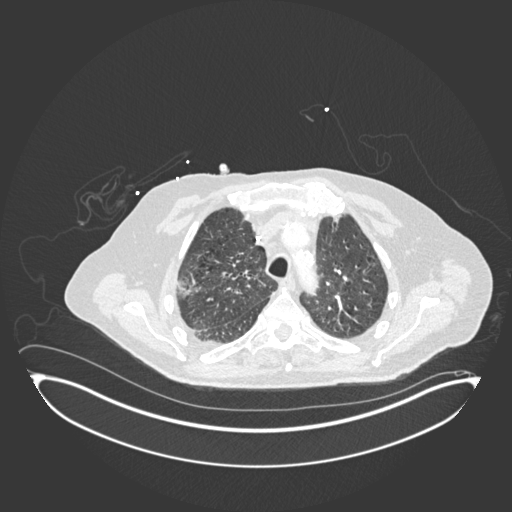
[im 241/310  mediastinal]
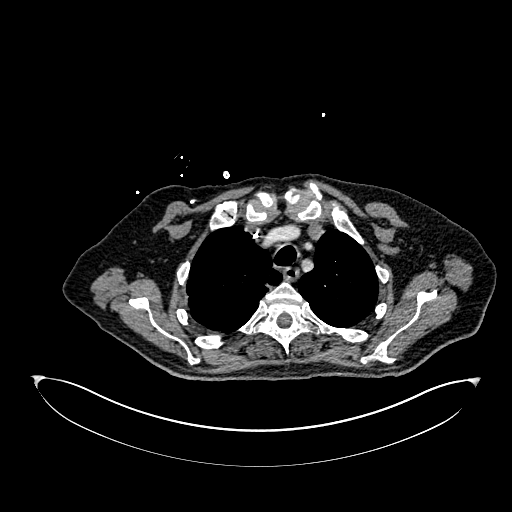
[im 258/310  lung]
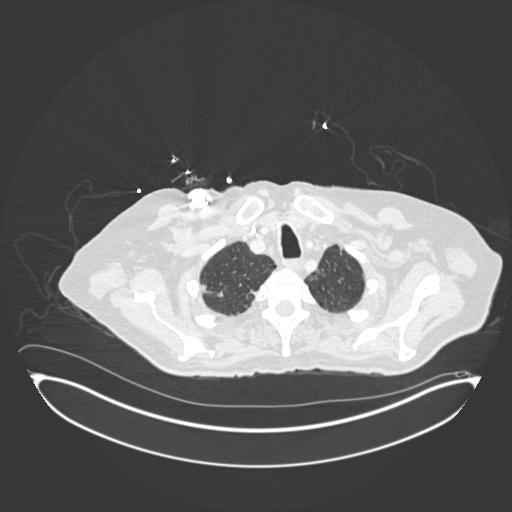
[im 275/310  mediastinal]
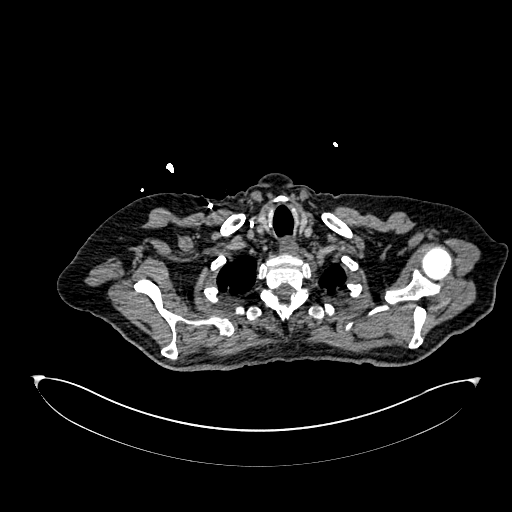
[im 292/310  lung]
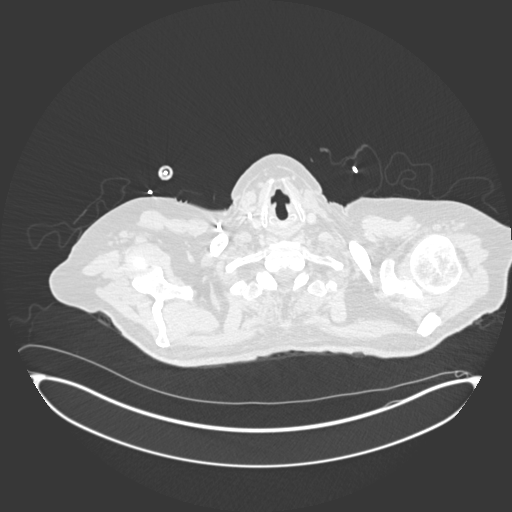

[Series 8: coronal mpr · coronal · 0.63mm/px · 1 of 151 slices shown]
[im 76/151  mediastinal]
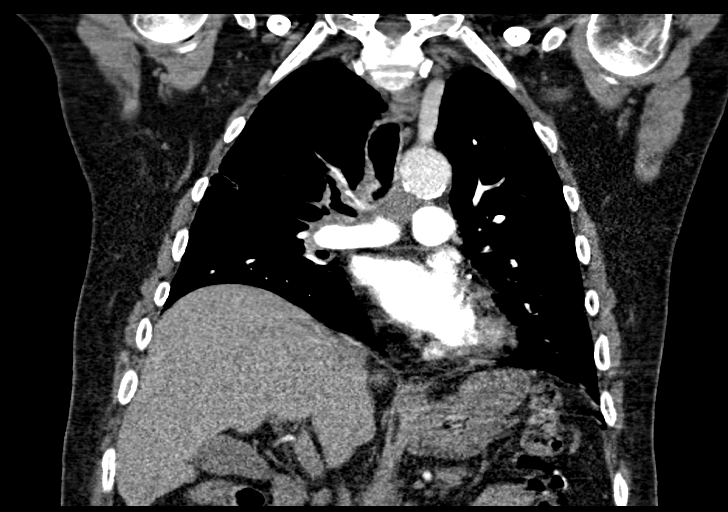

[18 of 36 positions shown; findings below may reference images not displayed]

RADIATION DOSE REDUCTION: This exam was performed according to the
departmental dose-optimization program which includes automated
exposure control, adjustment of the mA and/or kV according to
patient size and/or use of iterative reconstruction technique.

CONTRAST:  75mL OMNIPAQUE IOHEXOL 350 MG/ML SOLN
FINDINGS: Cardiovascular: Satisfactory opacification of the pulmonary arteries
to the segmental level. No evidence of pulmonary embolism. Normal
heart size. No pericardial effusion. Atherosclerosis of thoracic
aorta is noted. 4.1 cm ascending thoracic aortic aneurysm is noted.
Coronary calcifications are noted suggesting coronary artery
disease.

Mediastinum/Nodes: Significantly smaller precarinal and right hilar
mass is noted most consistent with post treatment change for history
of lung cancer. Thyroid gland is unremarkable. The esophagus is
unremarkable.

Lungs/Pleura: No pneumothorax or pleural effusion is noted. Right
upper lobe mass noted on prior exam is nearly resolved. Left lower
lobe reticular opacities are noted concerning for asymmetric edema
or atypical inflammation or post treatment change.

Upper Abdomen: Stable 2.3 cm right adrenal nodule is noted. Stable
hepatic cyst.

Musculoskeletal: No chest wall abnormality. No acute or significant
osseous findings.

Review of the MIP images confirms the above findings.
IMPRESSION: No definite evidence of pulmonary embolus.

4.1 cm ascending thoracic aortic aneurysm. Recommend annual imaging
followup by CTA or MRA. This recommendation follows [EY]
ACCF/AHA/AATS/ACR/ASA/SCA/CHIO WENG/CHIO WENG/CHIO WENG/CHIO WENG Guidelines for the
Diagnosis and Management of Patients with Thoracic Aortic Disease.
Circulation. [EY]; 121: [id number]. Aortic aneurysm NOS
([EY]-[EY]).

Coronary artery calcifications are noted suggesting coronary artery
disease.

Mediastinal adenopathy and right lung mass are significantly
decreased consistent with post treatment change.

Left lower lobe reticular opacities are noted concerning for
asymmetric edema or atypical inflammation or post treatment change.

Stable right adrenal nodule.

Aortic Atherosclerosis ([EY]-[EY]).

## 2022-01-27 MED ORDER — SODIUM CHLORIDE 0.9 % IV SOLN
500.0000 mg | Freq: Once | INTRAVENOUS | Status: AC
  Administered 2022-01-27: 500 mg via INTRAVENOUS
  Filled 2022-01-27: qty 5

## 2022-01-27 MED ORDER — IOHEXOL 350 MG/ML SOLN
100.0000 mL | Freq: Once | INTRAVENOUS | Status: AC | PRN
  Administered 2022-01-27: 75 mL via INTRAVENOUS

## 2022-01-27 MED ORDER — POTASSIUM CHLORIDE 20 MEQ PO PACK
40.0000 meq | PACK | Freq: Every day | ORAL | Status: DC
  Administered 2022-01-27: 40 meq via ORAL
  Filled 2022-01-27: qty 2

## 2022-01-27 MED ORDER — SODIUM CHLORIDE (PF) 0.9 % IJ SOLN
INTRAMUSCULAR | Status: AC
  Filled 2022-01-27: qty 50

## 2022-01-27 MED ORDER — CEFTRIAXONE SODIUM 1 G IJ SOLR
1.0000 g | Freq: Once | INTRAMUSCULAR | Status: AC
  Administered 2022-01-27: 1 g via INTRAVENOUS
  Filled 2022-01-27: qty 10

## 2022-01-27 MED ORDER — HEPARIN SOD (PORK) LOCK FLUSH 100 UNIT/ML IV SOLN
500.0000 [IU] | Freq: Once | INTRAVENOUS | Status: AC
  Administered 2022-01-27: 500 [IU]
  Filled 2022-01-27: qty 5

## 2022-01-27 MED ORDER — DOXYCYCLINE HYCLATE 100 MG PO CAPS: 100.0000 mg | ORAL_CAPSULE | Freq: Two times a day (BID) | ORAL | 0 refills | Status: DC

## 2022-01-27 NOTE — ED Provider Notes (Signed)
[facility] DEPT Provider Note   CSN: [id number] Arrival date & time: 01/27/22  1422     History  Chief Complaint  Patient presents with   Shortness of Breath    Ryan [name] is a 66 y.o. male.  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  Patient has had baseline cough with baseline sputum production.  He has not noted fever or chills.  He has not been exposed to any known sick people.  He has had COVID vaccines and 1 booster.  Last chemo was 1 week ago.  He has had decreased p.o. intake.  His significant other is at the bedside and is giving additional history.  Patient is generally weak.  Dyspnea is worse with any exertion.  He has had no known history of DVT or PE.  He reports no peripheral edema or swelling.    Home Medications Prior to Admission medications   Medication Sig Start Date End Date Taking? Authorizing Provider  doxycycline (VIBRAMYCIN) 100 MG capsule Take 1 capsule (100 mg total) by mouth 2 (two) times daily. 01/27/22  Yes Pattricia Boss, MD  atorvastatin (LIPITOR) 20 MG tablet Take 20 mg by mouth daily. 10/01/21   [provider]  lidocaine-prilocaine (EMLA) cream Apply 1 application topically as needed. 12/08/21   [name], MD  memantine (NAMENDA) 10 MG tablet Take 1 tablet (10 mg total) by mouth 2 (two) times daily. 12/01/21   Hayden Pedro, PA-C  mirtazapine (REMERON) 30 MG tablet Take 1 tablet (30 mg total) by mouth at bedtime. 01/19/22   [name], MD  ondansetron (ZOFRAN) 8 MG tablet Take 1 tablet (8 mg total) by mouth every 8 (eight) hours as needed for nausea or vomiting. 12/08/21   [name], MD  pantoprazole (PROTONIX) 40 MG tablet Take 1 tablet (40 mg total) by mouth daily. 12/06/21   Dessa Phi, DO      Allergies    Hydralazine and Percocet [oxycodone-acetaminophen]    Review of  Systems   Review of Systems  All other systems reviewed and are negative.  Physical Exam Updated Vital Signs BP 116/87    Pulse 99    Temp 97.7 F (36.5 C) (Oral)    Resp 20    Ht 1.88 m (_0 )    Wt 83.9 kg    SpO2 96%    BMI 23.75 kg/m  Physical Exam Vitals and nursing note reviewed.  Constitutional:      General: He is not in acute distress.    Appearance: He is well-developed. He is ill-appearing.     Comments: Grayish cast to scan  HENT:     Head: Normocephalic.     Mouth/Throat:     Pharynx: Oropharynx is clear.  Eyes:     Pupils: Pupils are equal, round, and reactive to light.  Cardiovascular:     Rate and Rhythm: Normal rate and regular rhythm.  Pulmonary:     Effort: Pulmonary effort is normal.     Breath sounds: Normal breath sounds.  Chest:     Chest wall: No mass.     Comments: Port in place No surrounding signs of erythema induration or fluctuance Abdominal:     Palpations: Abdomen is soft.  Musculoskeletal:     Cervical back: Normal range of motion.     Right lower leg: No edema.     Left lower leg:  Edema present.  Skin:    General: Skin is warm and dry.     Capillary Refill: Capillary refill takes 2 to 3 seconds.     Comments: Skin appears very dry  Neurological:     General: No focal deficit present.     Mental Status: He is alert.  Psychiatric:        Mood and Affect: Mood normal.    ED Results / Procedures / Treatments   Labs (all labs ordered are listed, but only abnormal results are displayed) Labs Reviewed  D-DIMER, QUANTITATIVE - Abnormal; Notable for the following components:      Result Value   D-Dimer, Quant 1.21 (*)    All other components within normal limits  RESP PANEL BY RT-PCR (FLU A&B, COVID) [id number]  CULTURE, BLOOD (ROUTINE X 2)  CULTURE, BLOOD (ROUTINE X 2)  LACTIC ACID, PLASMA  BRAIN NATRIURETIC PEPTIDE  LACTIC ACID, PLASMA    EKG EKG Interpretation  Date/Time:  Tuesday January 27 2022 14:49:32 EST Ventricular  Rate:  101 PR Interval:  199 QRS Duration: 88 QT Interval:  343 QTC Calculation: 445 R Axis:   35 Text Interpretation: Sinus tachycardia Otherwise no change from first prior EKG of November 26, 2021 Confirmed by Pattricia Boss [phone]) on 01/27/2022 3:31:48 PM  Radiology CT Angio Chest PE W and/or Wo Contrast  Result Date: 01/27/2022 CLINICAL DATA:  Shortness of breath. EXAM: CT ANGIOGRAPHY CHEST WITH CONTRAST TECHNIQUE: Multidetector CT imaging of the chest was performed using the standard protocol during bolus administration of intravenous contrast. Multiplanar CT image reconstructions and MIPs were obtained to evaluate the vascular anatomy. RADIATION DOSE REDUCTION: This exam was performed according to the departmental dose-optimization program which includes automated exposure control, adjustment of the mA and/or kV according to patient size and/or use of iterative reconstruction technique. CONTRAST:  3m OMNIPAQUE IOHEXOL 350 MG/ML SOLN COMPARISON:  November 26, 2021. FINDINGS: Cardiovascular: Satisfactory opacification of the pulmonary arteries to the segmental level. No evidence of pulmonary embolism. Normal heart size. No pericardial effusion. Atherosclerosis of thoracic aorta is noted. 4.1 cm ascending thoracic aortic aneurysm is noted. Coronary calcifications are noted suggesting coronary artery disease. Mediastinum/Nodes: Significantly smaller precarinal and right hilar mass is noted most consistent with post treatment change for history of lung cancer. Thyroid gland is unremarkable. The esophagus is unremarkable. Lungs/Pleura: No pneumothorax or pleural effusion is noted. Right upper lobe mass noted on prior exam is nearly resolved. Left lower lobe reticular opacities are noted concerning for asymmetric edema or atypical inflammation or post treatment change. Upper Abdomen: Stable 2.3 cm right adrenal nodule is noted. Stable hepatic cyst. Musculoskeletal: No chest wall abnormality. No acute or  significant osseous findings. Review of the MIP images confirms the above findings. IMPRESSION: No definite evidence of pulmonary embolus. 4.1 cm ascending thoracic aortic aneurysm. Recommend annual imaging followup by CTA or MRA. This recommendation follows 2010 ACCF/AHA/AATS/ACR/ASA/SCA/SCAI/SIR/STS/SVM Guidelines for the Diagnosis and Management of Patients with Thoracic Aortic Disease. Circulation. 2010; 121:: [id number] Aortic aneurysm NOS (ICD10-I71.9). Coronary artery calcifications are noted suggesting coronary artery disease. Mediastinal adenopathy and right lung mass are significantly decreased consistent with post treatment change. Left lower lobe reticular opacities are noted concerning for asymmetric edema or atypical inflammation or post treatment change. Stable right adrenal nodule. Aortic Atherosclerosis (ICD10-I70.0). Electronically Signed   By: JMarijo ConceptionM.D.   On: 01/27/2022 16:22   DG Chest Port 1 View  Result Date: 01/27/2022 CLINICAL DATA:  Short of breath, small cell  lung cancer EXAM: PORTABLE CHEST 1 VIEW COMPARISON:  01/13/2022, 11/30/2021 FINDINGS: Single frontal view of the chest demonstrates right chest wall port via internal jugular approach tip in the region of the superior vena cava. Cardiac silhouette is stable. Marked decrease in size of the right hilar mass consistent with response to therapy. Stable left lower lobe airspace disease consistent with pneumonia or aspiration. No effusion or pneumothorax. No acute fractures. IMPRESSION: 1. Persistent left lower lobe airspace disease, most consistent with pneumonia. Continued follow-up to resolution after appropriate medical management is recommended. 2. Decreased right hilar mass, consistent with response to therapy of known small cell lung cancer. Electronically Signed   By: Randa Ngo M.D.   On: 01/27/2022 15:09    Procedures Procedures    Medications Ordered in ED Medications  cefTRIAXone (ROCEPHIN) 1 g in sodium  chloride 0.9 % 100 mL IVPB (has no administration in time range)  azithromycin (ZITHROMAX) 500 mg in sodium chloride 0.9 % 250 mL IVPB (has no administration in time range)  sodium chloride (PF) 0.9 % injection (has no administration in time range)  potassium chloride (KLOR-CON) packet 40 mEq (40 mEq Oral Given 01/27/22 1707)  iohexol (OMNIPAQUE) 350 MG/ML injection 100 mL (75 mLs Intravenous Contrast Given 01/27/22 1555)    ED Course/ Medical Decision Making/ A&P Clinical Course as of 01/27/22 1718  Tue Jan 27, 2022  1528 Chest x-Avanell Banwart reviewed and increased markings left lower lobe Reviewed radiologist interpretation persistent left lower lobe airspace disease that could be pneumonia as well as decreased right hilar mass [DR]  1532 EKG reviewed with sinus tachycardia rate of 101 [DR]  1532 D-dimer mildly elevated CTA obtained without any evidence of acute PE [DR]  3716 CTA reviewed with left lower lobe reticular opacities noted consider asymmetric edema versus posttreatment versus infectious or inflammatory changes Ascending thoracic aortic aneurysm noted recommended follow-up CTA or MRA annually [DR]    Clinical Course User Index [DR] Pattricia Boss, MD                           Medical Decision Making 66 year old male with known metastatic lung cancer presents with increased dyspnea and oxygen requirements.  Here in the ED chest x-Buelah Rennie is consistent with left lower lobe infiltrate however, has been present from prior.  Plan broad-spectrum antibiotics. Patient also at high risk for PE.  He does not appear to have a DVT.  However, given his known lung cancer will obtain CTA. Patient with increased respiratory rate, lung ca, recent rt, covering with antibiotics  Discussed admission with patient and significant other.  Patient wishes discharge home.  With review of his labs he has some leukocytosis but has had recent chemo and has had leukocytosis multiple times recently. He has mild hypokalemia  but otherwise no acute electrolyte abnormalities BUN/creatinine appears stable I have discussed outpatient treatment with he and his significant other.  We have discussed strict return precautions and need for close follow-up.  We will go ahead and give the Rocephin and Zithromax here in the ED.  Plan outpatient antibiotics.   Amount and/or Complexity of Data Reviewed Independent Historian: caregiver External Data Reviewed: labs. Labs: ordered. Radiology: ordered. Decision-making details documented in ED Course. ECG/medicine tests: ordered. Decision-making details documented in ED Course. Discussion of management or test interpretation with external provider(s): 66 year old male with complex medical history and active lung cancer presents with dyspnea.  He required an extensive work-up in the ED.  Ultimately, he is significantly dyspneic and has some increased respiratory rate but no evidence of acute respiratory failure or definitive new cause of his dyspnea.  Patient desires to go home.  Given no definitive cause, risks and benefits are discussed with the patient.  He understands return precautions and wishes to proceed with discharge.  He is given Rocephin and Zithromax here in the ED.  He will be placed on doxycycline as an outpatient.  Risk OTC drugs. Prescription drug management. Decision regarding hospitalization.  Critical Care Total time providing critical care: < 30 minutes  Reviewed labs drawn today in cancer center that include c-Met which is significant for mild hypokalemia with potassium 3.2, glucose 145, BUN 32 and creatinine 0.54.  His alk phos is slightly elevated at 160.  AST and ALT are low to normal, bilirubin is normal His CBC from cancer center today shows leukocytosis with white blood cell count of 13,700, hemoglobin mildly anemic at 11.4, platelets are normal at 239,000        Final Clinical Impression(s) / ED Diagnoses Final diagnoses:  Malignant neoplasm of  lung, unspecified laterality, unspecified part of lung (HCC)  Dyspnea, unspecified type  Aneurysm of ascending aorta without rupture    Rx / DC Orders ED Discharge Orders          Ordered    doxycycline (VIBRAMYCIN) 100 MG capsule  2 times daily        01/27/22 1715              Pattricia Boss, MD 01/27/22 1718

## 2022-01-27 NOTE — Discharge Instructions (Signed)
You have no definite new cause of your shortness of breath seen today.  We are treating you with antibiotics for possibility of infection.  You have an ascending aortic aneurysm that will need outpatient follow-up. If you have sudden worsening chest pain, fever, or worsening shortness of breath, please return immediately to the emergency department for reevaluation

## 2022-01-27 NOTE — Telephone Encounter (Signed)
I will need him to come in for labs and see me at 145 pm

## 2022-01-27 NOTE — Telephone Encounter (Signed)
Ryan [name] called and left a message. Since last week after treatment. Ryan [name] is more short of breath and out of breath. He is having a hard time walking due to the short of breath. He is not moving around much. Ryan [name] has lost more weight and does not have a appetite.  Ryan [name] is asking for suggestions?

## 2022-01-27 NOTE — Telephone Encounter (Signed)
Called and spoke with [name]. Appts added today. She is aware of appts.

## 2022-01-27 NOTE — Assessment & Plan Note (Signed)
He is profoundly debilitated and unable to move due to severe shortness of breath at rest As above, we will pursue urgent evaluation

## 2022-01-27 NOTE — Progress Notes (Signed)
Spoke w/ patient's spouse Ryan [name], verified identity and begin nursing interview in reference to patient Ryan [name]. She states that "[name] has been recently declining. He has no complaints of pain but, has a severely reduced appetite, is less responsive, short of breath, and is sleeping often for long periods of time." No other issues reported at this time.   Meaningful use complete. Ryan [name] has been notified of [name] 9:00am-01/28/22 telephone appointment w/ [name]. I left my extension in case patient needs to call.  Patient contact [phone]  Spouse Ryan [name] requests to be called post the telephone appointment w/ [name], so that she may be informed of his current care status. Contact # [phone]

## 2022-01-27 NOTE — Assessment & Plan Note (Signed)
His recent PET/CT imaging show excellent response to therapy His blood work is stable but the patient have profound dyspnea at rest I am concerned about possibility of undiagnosed pulmonary emboli I recommend urgent CT pulmonary angiogram for evaluation The patient is aware of possible admission to the hospital if he is diagnosed with pulmonary emboli

## 2022-01-27 NOTE — Progress Notes (Signed)
[facility] OFFICE PROGRESS NOTE  Patient Care Team: [name], MD as PCP - General (Hematology and Oncology) Valrie Hart, RN as Oncology Nurse Navigator (Oncology)  ASSESSMENT & PLAN:  Metastatic primary lung cancer, right [facility]) His recent PET/CT imaging show excellent response to therapy His blood work is stable but the patient have profound dyspnea at rest I am concerned about possibility of undiagnosed pulmonary emboli I recommend urgent CT pulmonary angiogram for evaluation The patient is aware of possible admission to the hospital if he is diagnosed with pulmonary emboli  Dyspnea and respiratory abnormality He has significant shortness of breath at rest and tachycardia He can barely finish a sentence His examination did not show significant expiratory wheezes His blood count is stable I expressed concern for possible undiagnosed PE and recommend urgent CT angiogram for evaluation and he is in agreement  Physical debility He is profoundly debilitated and unable to move due to severe shortness of breath at rest As above, we will pursue urgent evaluation  Orders Placed This Encounter  Procedures   CT Angio Chest Pulmonary Embolism (PE) W or WO Contrast    Standing Status:   Future    Standing Expiration Date:   01/27/2023    Order Specific Question:   If indicated for the ordered procedure, I authorize the administration of contrast media per Radiology protocol    Answer:   Yes    Order Specific Question:   Preferred imaging location?    Answer:   [facility]   Addendum at 2:13 PM: I was notified by my nursing staff that I cannot order a stat CT angiogram without insurance prior authorization.  I contacted the emergency room due to the concern for PE but we are not able to expedite his evaluation at the emergency room.  The patient is directed to the ER at [facility] for further evaluation  All questions were answered. The patient knows to call the  clinic with any problems, questions or concerns. The total time spent in the appointment was 40 minutes encounter with patients including review of chart and various tests results, discussions about plan of care and coordination of care plan   [name], MD 01/27/2022 2:08 PM  INTERVAL HISTORY: Please see below for problem oriented charting. he returns for urgent evaluation He complain of severe shortness of breath at rest and not able to move much His appetite is depressed He has lost weight He denies mucositis, nausea or changes in bowel habits No recent cough  REVIEW OF SYSTEMS:   Constitutional: Denies fevers, chills  Eyes: Denies blurriness of vision Ears, nose, mouth, throat, and face: Denies mucositis or sore throat Cardiovascular: Denies palpitation, chest discomfort or lower extremity swelling Gastrointestinal:  Denies nausea, heartburn or change in bowel habits Skin: Denies abnormal skin rashes Lymphatics: Denies new lymphadenopathy or easy bruising Neurological:Denies numbness, tingling or new weaknesses Behavioral/Psych: Mood is stable, no new changes  All other systems were reviewed with the patient and are negative.  I have reviewed the past medical history, past surgical history, social history and family history with the patient and they are unchanged from previous note.  ALLERGIES:  is allergic to hydralazine and percocet [oxycodone-acetaminophen].  MEDICATIONS:  Current Outpatient Medications  Medication Sig Dispense Refill   atorvastatin (LIPITOR) 20 MG tablet Take 20 mg by mouth daily.     lidocaine-prilocaine (EMLA) cream Apply 1 application topically as needed. 30 g 3   memantine (NAMENDA) 10 MG  tablet Take 1 tablet (10 mg total) by mouth 2 (two) times daily. 60 tablet 4   mirtazapine (REMERON) 30 MG tablet Take 1 tablet (30 mg total) by mouth at bedtime. 90 tablet 1   ondansetron (ZOFRAN) 8 MG tablet Take 1 tablet (8 mg total) by mouth every 8 (eight) hours  as needed for nausea or vomiting. 30 tablet 1   pantoprazole (PROTONIX) 40 MG tablet Take 1 tablet (40 mg total) by mouth daily. 30 tablet 2   No current facility-administered medications for this visit.    SUMMARY OF ONCOLOGIC HISTORY: Oncology History  Metastatic cancer to brain ([place])  11/27/2021 Initial Diagnosis   Metastatic cancer to brain [facility])   12/03/2021 -  Chemotherapy   Patient is on Treatment Plan : LUNG SMALL CELL Carboplatin D1 / Etoposide D1-3 q21d     Metastatic primary lung cancer, right ([place])  11/26/2021 Imaging   There is 2.9 cm lobulated noncalcified pleural-based nodule in the right upper lobe suggesting possible primary malignant neoplasm. There are pathologically enlarged bulky lymph nodes in the mediastinum and both hilar regions, more so on the right side suggesting metastatic lymphadenopathy. There is extrinsic pressure over the right main pulmonary artery and superior vena cava by the enlarged lymph nodes in mediastinum.   Increased interstitial markings and small scattered nodules are seen in the right lung which may be part of pneumonia or pulmonary metastatic disease.   There are nodular densities in both adrenals largest measuring 3 cm in the left adrenal suggesting possible metastatic disease. There are enlarged lymph nodes in the retroperitoneum and mesentery largest measuring 5.9 cm in maximum diameter suggesting metastatic lymphadenopathy.   There are multiple cysts in the liver. There are other indeterminate low-density lesions of varying sizes in the liver. Possibility of hepatic metastatic disease is not excluded.   Head of the pancreas is enlarged in size with lobulated margins. Possibility of neoplastic process in the head of the pancreas is not excluded. Follow-up PET-CT and biopsy as warranted should be considered.   Coronary artery calcifications are seen. Other findings as described in the body of the report.   11/26/2021 - 12/05/2021 Hospital  Admission   He was admitted to the hospital and found to have metastatic lung cancer   11/27/2021 Initial Diagnosis   Metastatic primary lung cancer, right ([place])   11/27/2021 Imaging   MRI HEAD IMPRESSION:   1. 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 Mild localized edema with hemorrhagic blood products about several of these lesions without significant regional mass effect or midline shift.  2. 3.2 x 6.6 x 4.8 cm enhancing extra-axial mass at the posterior aspect of the superior sagittal sinus, indeterminate. While this finding could reflect an additional dural-based metastasis, a possible concomitant meningioma could also be considered.   MRV HEAD IMPRESSION:   1. Invasion and obliteration of the posterior superior sagittal sinus by the above described dural based mass. Small amount of additional nonocclusive thrombus within the superior sagittal sinus anterior to the mass as above. 2. Remainder of the major dural sinuses are otherwise patent.     11/28/2021 Pathology Results   A. PERITONEAL MASS, UPPER ABDOMINAL MIDLINE, BIOPSY:  High-grade neuroendocrine carcinoma, small cell type (Small cell  carcinoma).  Please see comment.   The following immunostains are performed on block A2 with appropriate  controls:  CK7: Negative.  TTF-1: Positive.  Chromogranin: Negative.  Synoptophysin: Negative.  CD56: Positive.  Ki-67: Very high proliferative  index (more than 90%).   Comment: Morphologic features of the neoplasm are most consistent with metastasis from a primary small cell carcinoma in the lung.    12/03/2021 -  Chemotherapy   Patient is on Treatment Plan : LUNG SMALL CELL Carboplatin D1 / Etoposide D1-3 q21d     12/03/2021 -  Chemotherapy   Patient is on Treatment Plan :  LUNG SMALL CELL Carboplatin D1 / Etoposide D1-3 q21d     12/09/2021 Cancer Staging   Staging form: Lung, AJCC 8th Edition - Clinical: Stage  IV (cT4, cN3, pM1) - Signed by [name], MD on 12/09/2021    01/14/2022 PET scan   1. Since the CTs of 11/26/2021, response to therapy of widespread metastatic small-cell carcinoma as detailed above. Near complete resolution right upper lobe primary with significant improvement in thoracic nodal, adrenal, abdominal nodal, and peritoneal metastasis. 2. No new or progressive disease. 3. Development of left-sided hypermetabolic pulmonary opacity. Suspicious for infection.       PHYSICAL EXAMINATION: ECOG PERFORMANCE STATUS: 2 - Symptomatic, <50% confined to bed  Vitals:   01/27/22 1338  BP: 108/75  Pulse: (!) 122  Resp: (!) 22  Temp: (!) 97.5 F (36.4 C)  SpO2: 99%   Filed Weights   01/27/22 1338  Weight: 185 lb 9.6 oz (84.2 kg)    GENERAL:alert, in mild respiratory distress with tachypnea SKIN: skin color, texture, turgor are normal, no rashes or significant lesions EYES: normal, Conjunctiva are pink and non-injected, sclera clear OROPHARYNX:no exudate, no erythema and lips, buccal mucosa, and tongue normal  NECK: supple, thyroid normal size, non-tender, without nodularity LYMPH:  no palpable lymphadenopathy in the cervical, axillary or inguinal LUNGS: clear to auscultation and percussion with increased breathing effort.  No wheezes  HEART: Tachycardia, no murmurs and no lower extremity edema ABDOMEN:abdomen soft, non-tender and normal bowel sounds Musculoskeletal:no cyanosis of digits and no clubbing  NEURO: alert & oriented x 3 with fluent speech, no focal motor/sensory deficits  LABORATORY DATA:  I have reviewed the data as listed    Component Value Date/Time   NA 137 01/27/2022 1327   K 3.2 (L) 01/27/2022 1327   CL 104 01/27/2022 1327   CO2 22 01/27/2022 1327   GLUCOSE 145 (H) 01/27/2022 1327   BUN 32 (H) 01/27/2022 1327   CREATININE 0.54 (L) 01/27/2022 1327   CREATININE 0.55 (L) 12/19/2021 1426   CALCIUM 9.6 01/27/2022 1327   PROT 7.0 01/27/2022 1327    ALBUMIN 3.8 01/27/2022 1327   AST 11 (L) 01/27/2022 1327   AST 16 12/19/2021 1426   ALT 18 01/27/2022 1327   ALT 48 (H) 12/19/2021 1426   ALKPHOS 160 (H) 01/27/2022 1327   BILITOT 1.0 01/27/2022 1327   BILITOT 0.5 12/19/2021 1426   GFRNONAA >60 01/27/2022 1327   GFRNONAA >60 12/19/2021 1426   GFRAA >60 01/01/2019 0726    No results found for: SPEP, UPEP  Lab Results  Component Value Date   WBC 13.7 (H) 01/27/2022   NEUTROABS 11.8 (H) 01/27/2022   HGB 11.4 (L) 01/27/2022   HCT 34.3 (L) 01/27/2022   MCV 95.3 01/27/2022   PLT 239 01/27/2022      Chemistry      Component Value Date/Time   NA 137 01/27/2022 1327   K 3.2 (L) 01/27/2022 1327   CL 104 01/27/2022 1327   CO2 22 01/27/2022 1327   BUN 32 (H) 01/27/2022 1327   CREATININE 0.54 (L) 01/27/2022 1327   CREATININE 0.55 (  L) 12/19/2021 1426      Component Value Date/Time   CALCIUM 9.6 01/27/2022 1327   ALKPHOS 160 (H) 01/27/2022 1327   AST 11 (L) 01/27/2022 1327   AST 16 12/19/2021 1426   ALT 18 01/27/2022 1327   ALT 48 (H) 12/19/2021 1426   BILITOT 1.0 01/27/2022 1327   BILITOT 0.5 12/19/2021 1426       RADIOGRAPHIC STUDIES: I have personally reviewed the radiological images as listed and agreed with the findings in the report. NM PET Image Initial (PI) Skull Base To Thigh  Result Date: 01/13/2022 CLINICAL DATA:  Initial treatment strategy for small-cell lung cancer. EXAM: NUCLEAR MEDICINE PET SKULL BASE TO THIGH TECHNIQUE: 9.4 mCi F-18 FDG was injected intravenously. Full-ring PET imaging was performed from the skull base to thigh after the radiotracer. CT data was obtained and used for attenuation correction and anatomic localization. Fasting blood glucose: 175 mg/dl COMPARISON:  11/26/2021 chest abdomen and pelvic CTs. FINDINGS: Mediastinal blood pool activity: SUV max 2.7 Liver activity: SUV max NA NECK: Right vocal cord hypermetabolism may relate to left-sided paralysis, but is nonspecific. No well-defined mass  in this area. No cervical nodal hypermetabolism. Incidental CT findings: Bilateral carotid atherosclerosis. No cervical adenopathy. CHEST: Right paratracheal node measures 2.4 cm and a S.U.V. max of 4.4 on 83/4. This is significantly decreased from on the order of 6.5 cm on the prior CT (when remeasured). Other areas of confluent mediastinal adenopathy have significantly improved. The presumed right upper lobe primary has nearly completely resolved, with only linear opacity remaining including on 16/8. Low-level hypermetabolism at a S.U.V. max of 2.2. Development of posterior left upper and superior segment left lower lobe septal thickening and ground-glass opacity with hypermetabolism. Example at a S.U.V. max of 4.0 including on 35/8. Incidental CT findings: Centrilobular emphysema. Right Port-A-Cath tip superior caval/atrial junction. Aortic and coronary artery calcification. ABDOMEN/PELVIS: Right adrenal nodule measures 1.9 cm and a S.U.V. max of 4.6 on 118/4. Similar in size 2.0 cm on the prior. The left adrenal nodule is decreased in size and there is no hypermetabolism in this area. Gastrohepatic ligament node measures 2.5 cm and a S.U.V. max of 3.2 on 125/4 versus 5.4 cm on the prior CT (when remeasured). An omental 1.6 cm nodule measures a S.U.V. max of 3.1 on 141/4 versus 4.4 cm on the prior CT (when remeasured). Incidental CT findings: Hepatic cysts. Other liver lesions are not definitely simple cysts, but are without correlate hypermetabolism. Example hepatic dome at 1.0 cm on 103/4, similar to on the prior diagnostic CT. Abdominal aortic atherosclerosis.  Prostatomegaly. SKELETON: No abnormal marrow activity. Incidental CT findings: none IMPRESSION: 1. Since the CTs of 11/26/2021, response to therapy of widespread metastatic small-cell carcinoma as detailed above. Near complete resolution right upper lobe primary with significant improvement in thoracic nodal, adrenal, abdominal nodal, and peritoneal  metastasis. 2. No new or progressive disease. 3. Development of left-sided hypermetabolic pulmonary opacity. Suspicious for infection. Electronically Signed   By: Abigail Miyamoto M.D.   On: 01/13/2022 15:24

## 2022-01-27 NOTE — ED Triage Notes (Signed)
Patient seen at cancer center earlier today, patient states they were worried for blood clot due to increased SOB and tachycardia

## 2022-01-27 NOTE — Assessment & Plan Note (Signed)
He has significant shortness of breath at rest and tachycardia He can barely finish a sentence His examination did not show significant expiratory wheezes His blood count is stable I expressed concern for possible undiagnosed PE and recommend urgent CT angiogram for evaluation and he is in agreement

## 2022-01-28 ENCOUNTER — Ambulatory Visit
Admission: RE | Admit: 2022-01-28 | Discharge: 2022-01-28 | Disposition: A | Discharge status: Home / Self Care | Payer: BC Managed Care – PPO | Source: Ambulatory Visit | Attending: Urology | Admitting: Urology

## 2022-01-28 ENCOUNTER — Telehealth: Payer: Self-pay

## 2022-01-28 DIAGNOSIS — F40240 Claustrophobia: Secondary | ICD-10-CM

## 2022-01-28 DIAGNOSIS — C7931 Secondary malignant neoplasm of brain: Secondary | ICD-10-CM

## 2022-01-28 DIAGNOSIS — C3491 Malignant neoplasm of unspecified part of right bronchus or lung: Secondary | ICD-10-CM

## 2022-01-28 LAB — URINE CULTURE: Culture: 20000 — AB

## 2022-01-28 MED ORDER — DEXAMETHASONE 2 MG PO TABS: 2.0000 mg | ORAL_TABLET | Freq: Every day | ORAL | 0 refills | Status: DC

## 2022-01-28 MED ORDER — LORAZEPAM 1 MG PO TABS: 1.0000 mg | ORAL_TABLET | ORAL | 0 refills | Status: DC | PRN

## 2022-01-28 NOTE — Progress Notes (Signed)
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  12/02/21 - 12/16/21:  The whole brain was treated to 30 Gy in 10 fractions of 3 Gy  Narrative:  I spoke with the patient and his spouse, [name], to conduct his routine scheduled 1 month follow up visit via telephone to spare the patient unnecessary potential exposure in the healthcare setting during the current COVID-19 pandemic.  The patient was notified in advance and gave permission to proceed with this visit format.   He tolerated radiation treatment relatively well with only mild fatigue. He reported improved cough and decreased shortness of breath near the end of treatment.                               On review of systems, the patient states that he is doing fair in general.  He denies any residual ill effects from radiation aside from persistent fatigue and some dry skin on his head and face.  He has continued on systemic chemotherapy under the care of direction of Dr. Alvy Bimler with carboplatin and etoposide every 3 weeks.he is now s/p completion of 2 cycles and felt like he was tolerating this very well but was recently seen in the emergency department on 01/27/2022 with complaints of severe shortness of breath at rest.  A CT angiogram was performed to rule out pulmonary embolism and fortunately there was no evidence of PE but it did show what looks like a pneumonia.  He was given Rocephin and Zithromax in the emergency department and discharged home with doxycycline which he is  taking as prescribed. He had a restaging PET scan on 01/14/2022 which shows an excellent response to treatment with near complete resolution of the right upper lobe pulmonary mass and lymphadenopathy in the chest and abdomen with no evidence of new or progressive disease.  ALLERGIES:  is allergic to hydralazine and percocet [oxycodone-acetaminophen].  Meds: Current Outpatient Medications  Medication Sig Dispense Refill   atorvastatin (LIPITOR) 20 MG tablet Take 20 mg by mouth daily.     doxycycline (VIBRAMYCIN) 100 MG capsule Take 1 capsule (100 mg total) by mouth 2 (two) times daily. 20 capsule 0   lidocaine-prilocaine (EMLA) cream Apply 1 application topically as needed. 30 g 3   memantine (NAMENDA) 10 MG tablet Take 1 tablet (10 mg total) by mouth 2 (two) times daily. 60 tablet 4   mirtazapine (REMERON) 30 MG tablet Take 1 tablet (30 mg total) by mouth at bedtime. 90 tablet 1   ondansetron (ZOFRAN) 8 MG tablet Take 1 tablet (8 mg total) by mouth every 8 (eight) hours as needed for nausea or vomiting. 30 tablet 1   pantoprazole (PROTONIX) 40 MG tablet Take 1 tablet (40 mg total) by mouth daily. 30 tablet 2   No current facility-administered medications for this encounter.    Physical Findings:  vitals were not taken for this visit.  Pain  Assessment Pain Score: 0-No pain/10 Unable to assess due to telephone follow up visit format.  Lab Findings: Lab Results  Component Value Date   WBC 13.7 (H) 01/27/2022   HGB 11.4 (L) 01/27/2022   HCT 34.3 (L) 01/27/2022   MCV 95.3 01/27/2022   PLT 239 01/27/2022     Radiographic Findings: CT Angio Chest PE W and/or Wo Contrast  Result Date: 01/27/2022 CLINICAL DATA:  Shortness of breath. EXAM: CT ANGIOGRAPHY CHEST WITH CONTRAST TECHNIQUE: Multidetector CT imaging of the chest was performed using the standard protocol during bolus administration of intravenous contrast. Multiplanar CT image reconstructions and MIPs were obtained to evaluate the  vascular anatomy. RADIATION DOSE REDUCTION: This exam was performed according to the departmental dose-optimization program which includes automated exposure control, adjustment of the mA and/or kV according to patient size and/or use of iterative reconstruction technique. CONTRAST:  52mL OMNIPAQUE IOHEXOL 350 MG/ML SOLN COMPARISON:  November 26, 2021. FINDINGS: Cardiovascular: Satisfactory opacification of the pulmonary arteries to the segmental level. No evidence of pulmonary embolism. Normal heart size. No pericardial effusion. Atherosclerosis of thoracic aorta is noted. 4.1 cm ascending thoracic aortic aneurysm is noted. Coronary calcifications are noted suggesting coronary artery disease. Mediastinum/Nodes: Significantly smaller precarinal and right hilar mass is noted most consistent with post treatment change for history of lung cancer. Thyroid gland is unremarkable. The esophagus is unremarkable. Lungs/Pleura: No pneumothorax or pleural effusion is noted. Right upper lobe mass noted on prior exam is nearly resolved. Left lower lobe reticular opacities are noted concerning for asymmetric edema or atypical inflammation or post treatment change. Upper Abdomen: Stable 2.3 cm right adrenal nodule is noted. Stable hepatic cyst. Musculoskeletal: No chest wall abnormality. No acute or significant osseous findings. Review of the MIP images confirms the above findings. IMPRESSION: No definite evidence of pulmonary embolus. 4.1 cm ascending thoracic aortic aneurysm. Recommend annual imaging followup by CTA or MRA. This recommendation follows 2010 ACCF/AHA/AATS/ACR/ASA/SCA/SCAI/SIR/STS/SVM Guidelines for the Diagnosis and Management of Patients with Thoracic Aortic Disease. Circulation. 2010; 121: K812-X517. Aortic aneurysm NOS (ICD10-I71.9). Coronary artery calcifications are noted suggesting coronary artery disease. Mediastinal adenopathy and right lung mass are significantly decreased consistent with post treatment  change. Left lower lobe reticular opacities are noted concerning for asymmetric edema or atypical inflammation or post treatment change. Stable right adrenal nodule. Aortic Atherosclerosis (ICD10-I70.0). Electronically Signed   By: Marijo Conception M.D.   On: 01/27/2022 16:22   NM PET Image Initial (PI) Skull Base To Thigh  Result Date: 01/13/2022 CLINICAL DATA:  Initial treatment strategy for small-cell lung cancer. EXAM: NUCLEAR MEDICINE PET SKULL BASE TO THIGH TECHNIQUE: 9.4 mCi F-18 FDG was injected intravenously. Full-ring PET imaging was performed from the skull base to thigh after the radiotracer. CT data was obtained and used for attenuation correction and anatomic localization. Fasting blood glucose: 175 mg/dl COMPARISON:  11/26/2021 chest abdomen and pelvic CTs. FINDINGS: Mediastinal blood pool activity: SUV max 2.7 Liver activity: SUV max NA NECK: Right vocal cord hypermetabolism may relate to left-sided paralysis, but is nonspecific. No well-defined mass in this area. No cervical nodal hypermetabolism. Incidental CT findings: Bilateral carotid atherosclerosis. No cervical adenopathy. CHEST: Right paratracheal node measures 2.4 cm and a S.U.V. max of 4.4 on 83/4. This is significantly decreased from on the order of 6.5 cm on the prior CT (when remeasured). Other areas of confluent mediastinal adenopathy have significantly improved. The presumed right upper lobe primary has nearly completely resolved, with only linear opacity remaining including on 16/8.  Low-level hypermetabolism at a S.U.V. max of 2.2. Development of posterior left upper and superior segment left lower lobe septal thickening and ground-glass opacity with hypermetabolism. Example at a S.U.V. max of 4.0 including on 35/8. Incidental CT findings: Centrilobular emphysema. Right Port-A-Cath tip superior caval/atrial junction. Aortic and coronary artery calcification. ABDOMEN/PELVIS: Right adrenal nodule measures 1.9 cm and a S.U.V. max of  4.6 on 118/4. Similar in size 2.0 cm on the prior. The left adrenal nodule is decreased in size and there is no hypermetabolism in this area. Gastrohepatic ligament node measures 2.5 cm and a S.U.V. max of 3.2 on 125/4 versus 5.4 cm on the prior CT (when remeasured). An omental 1.6 cm nodule measures a S.U.V. max of 3.1 on 141/4 versus 4.4 cm on the prior CT (when remeasured). Incidental CT findings: Hepatic cysts. Other liver lesions are not definitely simple cysts, but are without correlate hypermetabolism. Example hepatic dome at 1.0 cm on 103/4, similar to on the prior diagnostic CT. Abdominal aortic atherosclerosis.  Prostatomegaly. SKELETON: No abnormal marrow activity. Incidental CT findings: none IMPRESSION: 1. Since the CTs of 11/26/2021, response to therapy of widespread metastatic small-cell carcinoma as detailed above. Near complete resolution right upper lobe primary with significant improvement in thoracic nodal, adrenal, abdominal nodal, and peritoneal metastasis. 2. No new or progressive disease. 3. Development of left-sided hypermetabolic pulmonary opacity. Suspicious for infection. Electronically Signed   By: Abigail Miyamoto M.D.   On: 01/13/2022 15:24   DG Chest Port 1 View  Result Date: 01/27/2022 CLINICAL DATA:  Short of breath, small cell lung cancer EXAM: PORTABLE CHEST 1 VIEW COMPARISON:  01/13/2022, 11/30/2021 FINDINGS: Single frontal view of the chest demonstrates right chest wall port via internal jugular approach tip in the region of the superior vena cava. Cardiac silhouette is stable. Marked decrease in size of the right hilar mass consistent with response to therapy. Stable left lower lobe airspace disease consistent with pneumonia or aspiration. No effusion or pneumothorax. No acute fractures. IMPRESSION: 1. Persistent left lower lobe airspace disease, most consistent with pneumonia. Continued follow-up to resolution after appropriate medical management is recommended. 2. Decreased  right hilar mass, consistent with response to therapy of known small cell lung cancer. Electronically Signed   By: Randa Ngo M.D.   On: 01/27/2022 15:09    Impression/Plan: 1. 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 He appears to have recovered well from the effects of his recent radiotherapy and is without complaints aside from the persistent fatigue and dry skin on his head and face.  He is currently on antibiotics for pneumonia and will continue in routine follow-up under the care and direction of Dr. Alvy Bimler for continued management of his systemic disease.  We will continue to follow him with serial brain MRI scans every 3 months, with his first scan to be performed in March 2023 to assess his treatment response, to monitor for any evidence of disease recurrence or progression in the brain.  He is very claustrophobic and will need medication to help him relax and tolerate the imaging so we will make sure that he has this available to use prior to each MRI scan. We will plan to follow-up by phone following each MRI scan to review results and recommendations from the multidisciplinary brain tumor conference.  He and his spouse, [name], appear to have a good understanding of these recommendations and are comfortable and in agreement  with the stated plan.  They know that they are welcome to call at anytime in the interim with any questions or concerns related to his previous radiation.    Nicholos Johns, PA-C

## 2022-01-28 NOTE — Telephone Encounter (Signed)
-----   Message from [name], MD sent at 01/28/2022  3:27 PM EST ----- If he has leftover dexamethasone he can restart at 2 mg daily, if not, please call in 30 tabs with no refills ----- Message ----- From: Al Pimple, LPN Sent: 08/21/5040  36:43 PM EST To: [name], MD  [name] reports pt is doing better today, still fatigued but was able to drink a supplemental shake and ate scrambled eggs.  While on the phone I had her check pulse and O2, 108 and 97%.  [name] did want to know if you wanted to start him on a steroid - she said the ED mentioned this.  Please advise. Thanks  ----- Message ----- From: [name], MD Sent: 01/28/2022  10:24 AM EST To: Al Pimple, LPN  He was sent to the ER yesterday Can you call [name] and see how he is doing?

## 2022-01-28 NOTE — Telephone Encounter (Signed)
Called and spoke with pt spouse, [name].  [name] says pt is doing better today, still fatigued but was able to eat eggs and a supplemental shake.  Pt O2 97% and HR 108.  [name] asked about starting a steroid, says this was mentioned at their most recent ED visit. The above was reported to Dr Alvy Bimler who advises dexamethasone 2mg  qd.  [name] said she had a few leftover at home to get started and I informed her that I would send in additional to pharmacy.  [name] expressed understanding and many thanks

## 2022-01-29 ENCOUNTER — Other Ambulatory Visit: Payer: Self-pay | Admitting: Radiation Therapy

## 2022-01-29 DIAGNOSIS — C7931 Secondary malignant neoplasm of brain: Secondary | ICD-10-CM

## 2022-01-30 ENCOUNTER — Telehealth: Payer: Self-pay | Admitting: *Deleted

## 2022-01-30 NOTE — Telephone Encounter (Signed)
01/19/2022 Late entry On 01/19/2022 successfully faxed [place] form to [phone].  Original copy mailed to patient address on file. [address] [place] [address]  Copy to "Record Release" bin in front office area before [facility] for [place] staff to forward     The [facility] request for     Office Notes with Physical Examination Findings" to [facility] ([place]) Information Management Office, Phone: [phone], Fax: [phone] to complete this process.  As of 01/30/2022 no new form received to [facility] location.

## 2022-02-01 LAB — CULTURE, BLOOD (ROUTINE X 2)
Culture: NO GROWTH
Culture: NO GROWTH
Special Requests: ADEQUATE

## 2022-02-06 MED FILL — Dexamethasone Sodium Phosphate Inj 100 MG/10ML: INTRAMUSCULAR | Qty: 1 | Status: AC

## 2022-02-06 MED FILL — Fosaprepitant Dimeglumine For IV Infusion 150 MG (Base Eq): INTRAVENOUS | Qty: 5 | Status: AC

## 2022-02-09 ENCOUNTER — Inpatient Hospital Stay: Payer: BC Managed Care – PPO

## 2022-02-09 ENCOUNTER — Other Ambulatory Visit: Payer: Self-pay

## 2022-02-09 ENCOUNTER — Encounter: Payer: Self-pay | Admitting: Hematology and Oncology

## 2022-02-09 ENCOUNTER — Inpatient Hospital Stay: Payer: BC Managed Care – PPO | Admitting: Hematology and Oncology

## 2022-02-09 ENCOUNTER — Inpatient Hospital Stay: Payer: BC Managed Care – PPO | Admitting: Nutrition

## 2022-02-09 ENCOUNTER — Other Ambulatory Visit: Payer: Self-pay | Admitting: Hematology and Oncology

## 2022-02-09 DIAGNOSIS — C3491 Malignant neoplasm of unspecified part of right bronchus or lung: Secondary | ICD-10-CM

## 2022-02-09 DIAGNOSIS — R5381 Other malaise: Secondary | ICD-10-CM

## 2022-02-09 DIAGNOSIS — C7931 Secondary malignant neoplasm of brain: Secondary | ICD-10-CM

## 2022-02-09 DIAGNOSIS — R634 Abnormal weight loss: Secondary | ICD-10-CM | POA: Diagnosis not present

## 2022-02-09 DIAGNOSIS — I9589 Other hypotension: Secondary | ICD-10-CM

## 2022-02-09 LAB — CBC WITH DIFFERENTIAL/PLATELET
Abs Immature Granulocytes: 0.09 10*3/uL — ABNORMAL HIGH (ref 0.00–0.07)
Basophils Absolute: 0 10*3/uL (ref 0.0–0.1)
Basophils Relative: 0 %
Eosinophils Absolute: 0 10*3/uL (ref 0.0–0.5)
Eosinophils Relative: 0 %
HCT: 33.4 % — ABNORMAL LOW (ref 39.0–52.0)
Hemoglobin: 11 g/dL — ABNORMAL LOW (ref 13.0–17.0)
Immature Granulocytes: 1 %
Lymphocytes Relative: 5 %
Lymphs Abs: 0.6 10*3/uL — ABNORMAL LOW (ref 0.7–4.0)
MCH: 32.4 pg (ref 26.0–34.0)
MCHC: 32.9 g/dL (ref 30.0–36.0)
MCV: 98.2 fL (ref 80.0–100.0)
Monocytes Absolute: 0.4 10*3/uL (ref 0.1–1.0)
Monocytes Relative: 3 %
Neutro Abs: 11.2 10*3/uL — ABNORMAL HIGH (ref 1.7–7.7)
Neutrophils Relative %: 91 %
Platelets: 264 10*3/uL (ref 150–400)
RBC: 3.4 MIL/uL — ABNORMAL LOW (ref 4.22–5.81)
RDW: 17.5 % — ABNORMAL HIGH (ref 11.5–15.5)
WBC: 12.4 10*3/uL — ABNORMAL HIGH (ref 4.0–10.5)
nRBC: 0 % (ref 0.0–0.2)

## 2022-02-09 LAB — COMPREHENSIVE METABOLIC PANEL
ALT: 13 U/L (ref 0–44)
AST: 9 U/L — ABNORMAL LOW (ref 15–41)
Albumin: 3.7 g/dL (ref 3.5–5.0)
Alkaline Phosphatase: 98 U/L (ref 38–126)
Anion gap: 8 (ref 5–15)
BUN: 16 mg/dL (ref 8–23)
CO2: 29 mmol/L (ref 22–32)
Calcium: 9.4 mg/dL (ref 8.9–10.3)
Chloride: 101 mmol/L (ref 98–111)
Creatinine, Ser: 0.67 mg/dL (ref 0.61–1.24)
GFR, Estimated: >60 mL/min (ref >60)
Glucose, Bld: 158 mg/dL — ABNORMAL HIGH (ref 70–99)
Potassium: 3.9 mmol/L (ref 3.5–5.1)
Sodium: 138 mmol/L (ref 135–145)
Total Bilirubin: 0.7 mg/dL (ref 0.3–1.2)
Total Protein: 6.7 g/dL (ref 6.5–8.1)

## 2022-02-09 LAB — SAMPLE TO BLOOD BANK

## 2022-02-09 MED ORDER — SODIUM CHLORIDE 0.9% FLUSH
10.0000 mL | INTRAVENOUS | Status: DC | PRN
  Administered 2022-02-09: 10 mL

## 2022-02-09 MED ORDER — SODIUM CHLORIDE 0.9 % IV SOLN
100.0000 mg/m2 | Freq: Once | INTRAVENOUS | Status: AC
  Administered 2022-02-09: 210 mg via INTRAVENOUS
  Filled 2022-02-09: qty 10.5

## 2022-02-09 MED ORDER — SODIUM CHLORIDE 0.9% FLUSH
10.0000 mL | Freq: Once | INTRAVENOUS | Status: AC
  Administered 2022-02-09: 10 mL

## 2022-02-09 MED ORDER — SODIUM CHLORIDE 0.9 % IV SOLN
10.0000 mg | Freq: Once | INTRAVENOUS | Status: AC
  Administered 2022-02-09: 10 mg via INTRAVENOUS
  Filled 2022-02-09: qty 10

## 2022-02-09 MED ORDER — PALONOSETRON HCL INJECTION 0.25 MG/5ML
0.2500 mg | Freq: Once | INTRAVENOUS | Status: AC
  Administered 2022-02-09: 0.25 mg via INTRAVENOUS
  Filled 2022-02-09: qty 5

## 2022-02-09 MED ORDER — SODIUM CHLORIDE 0.9 % IV SOLN
1200.0000 mg | Freq: Once | INTRAVENOUS | Status: AC
  Administered 2022-02-09: 1200 mg via INTRAVENOUS
  Filled 2022-02-09: qty 20

## 2022-02-09 MED ORDER — ATORVASTATIN CALCIUM 10 MG PO TABS
10.0000 mg | ORAL_TABLET | Freq: Every day | ORAL | 3 refills | Status: AC
Start: 2022-02-09 — End: ?

## 2022-02-09 MED ORDER — HEPARIN SOD (PORK) LOCK FLUSH 100 UNIT/ML IV SOLN
500.0000 [IU] | Freq: Once | INTRAVENOUS | Status: AC | PRN
  Administered 2022-02-09: 500 [IU]

## 2022-02-09 MED ORDER — SODIUM CHLORIDE 0.9 % IV SOLN
546.5000 mg | Freq: Once | INTRAVENOUS | Status: AC
  Administered 2022-02-09: 550 mg via INTRAVENOUS
  Filled 2022-02-09: qty 55

## 2022-02-09 MED ORDER — SODIUM CHLORIDE 0.9 % IV SOLN: Freq: Once | INTRAVENOUS | Status: AC

## 2022-02-09 MED ORDER — SODIUM CHLORIDE 0.9 % IV SOLN
150.0000 mg | Freq: Once | INTRAVENOUS | Status: AC
  Administered 2022-02-09: 150 mg via INTRAVENOUS
  Filled 2022-02-09: qty 150

## 2022-02-09 MED FILL — Dexamethasone Sodium Phosphate Inj 100 MG/10ML: INTRAMUSCULAR | Qty: 1 | Status: AC

## 2022-02-09 NOTE — Progress Notes (Signed)
Nutrition follow-up completed with patient during infusion for small cell lung cancer with metastases to brain.  Weight decreased and was documented as 183.2 pounds February 20 down from 198 pounds January 30.  This is an 8% loss in less than 1 month which is significant.  Labs include glucose 158.  Patient reports he thinks his appetite has improved.  He is beginning to eat a bit more.  Food still tastes awful.  Describes it as tasting like cardboard.  He still manages to try to eat what he can. He reports drinking Ensure when he can find product at the store.  He denies other nutrition impact symptoms.  Nutrition diagnosis: Food and nutrition related knowledge deficit stable.  Intervention: Educated on importance of continuing to try to eat smaller amounts more often.  Focus on higher calorie, high-protein foods. Reviewed strategies for improving taste alterations. Encourage patient to resume Ensure plus or equivalent 4 times daily.  Provided complementary case of vanilla Ensure plus and strawberry Ensure plus.  Patient was appreciative.  Monitoring, evaluation, goals: Patient will work to increase calories and protein to minimize weight loss.  Next visit: Monday, March 13 during infusion.  **Disclaimer: This note was dictated with voice recognition software. Similar sounding words can inadvertently be transcribed and this note may contain transcription errors which may not have been corrected upon publication of note.**

## 2022-02-09 NOTE — Assessment & Plan Note (Signed)
He is eating better He has appointment to see dietitian today I encouraged the patient to eat frequent small meals as tolerated

## 2022-02-09 NOTE — Assessment & Plan Note (Signed)
Overall, he is getting stronger He will be graduating home physical therapy I offer him referral to outpatient physical therapy and rehab For now, he would prefer to increase exercise as tolerated at home

## 2022-02-09 NOTE — Assessment & Plan Note (Signed)
With improvement of oral intake His blood pressure is improving Monitor closely

## 2022-02-09 NOTE — Progress Notes (Signed)
[name]  Patient Care Team: [name], MD as PCP - General (Hematology and Oncology) Valrie Hart, RN as Oncology Nurse Navigator (Oncology)  ASSESSMENT & PLAN:  Metastatic primary lung cancer, right [facility]) He continues to have slight weight loss The dose of treatment is readjusted based on current weight I have reviewed documentation from recent emergency room visit His vitals are now stabilizing and he is clinically improving We will continue treatment as scheduled He has appointment to see a radiation oncologist for follow-up I plan 6 cycles of chemotherapy before switching him to maintenance immunotherapy  Hypotension With improvement of oral intake His blood pressure is improving Monitor closely  Weight loss He is eating better He has appointment to see dietitian today I encouraged the patient to eat frequent small meals as tolerated  Physical debility Overall, he is getting stronger He will be graduating home physical therapy I offer him referral to outpatient physical therapy and rehab For now, he would prefer to increase exercise as tolerated at home  No orders of the defined types were placed in this encounter.   All questions were answered. The patient knows to call the clinic with any problems, questions or concerns. The total time spent in the appointment was 30 minutes encounter with patients including review of chart and various tests results, discussions about plan of care and coordination of care plan   [name], MD 02/09/2022 10:58 AM  INTERVAL HISTORY: Please see below for problem oriented charting. he returns for treatment follow-up with his significant other He is getting better No falls His shortness of breath is improving He is eating more food and less milkshakes His bowel habits are stable No new neurological deficit Physical therapy will be stopping home therapy this week  REVIEW OF SYSTEMS:    Constitutional: Denies fevers, chills or abnormal weight loss Eyes: Denies blurriness of vision Ears, nose, mouth, throat, and face: Denies mucositis or sore throat Respiratory: Denies cough, dyspnea or wheezes Cardiovascular: Denies palpitation, chest discomfort or lower extremity swelling Gastrointestinal:  Denies nausea, heartburn or change in bowel habits Skin: Denies abnormal skin rashes Lymphatics: Denies new lymphadenopathy or easy bruising Neurological:Denies numbness, tingling or new weaknesses Behavioral/Psych: Mood is stable, no new changes  All other systems were reviewed with the patient and are negative.  I have reviewed the past medical history, past surgical history, social history and family history with the patient and they are unchanged from previous note.  ALLERGIES:  is allergic to hydralazine and percocet [oxycodone-acetaminophen].  MEDICATIONS:  Current Outpatient Medications  Medication Sig Dispense Refill   atorvastatin (LIPITOR) 10 MG tablet Take 1 tablet (10 mg total) by mouth daily. 30 tablet 3   dexamethasone (DECADRON) 2 MG tablet Take 1 tablet (2 mg total) by mouth daily. 30 tablet 0   lidocaine-prilocaine (EMLA) cream Apply 1 application topically as needed. 30 g 3   LORazepam (ATIVAN) 1 MG tablet Take 1 tablet (1 mg total) by mouth as needed for anxiety (take one tablet 30 minutes prior to MRI scans and may repeat just before the scan if needed.). 40 tablet 0   memantine (NAMENDA) 10 MG tablet Take 1 tablet (10 mg total) by mouth 2 (two) times daily. 60 tablet 4   mirtazapine (REMERON) 30 MG tablet Take 1 tablet (30 mg total) by mouth at bedtime. 90 tablet 1   ondansetron (ZOFRAN) 8 MG tablet Take 1 tablet (8 mg total) by mouth every 8 (eight)  hours as needed for nausea or vomiting. 30 tablet 1   pantoprazole (PROTONIX) 40 MG tablet Take 1 tablet (40 mg total) by mouth daily. 30 tablet 2   No current facility-administered medications for this visit.    Facility-Administered Medications Ordered in Other Visits  Medication Dose Route Frequency Provider Last Rate Last Admin   atezolizumab (TECENTRIQ) 1,200 mg in sodium chloride 0.9 % 250 mL chemo infusion  1,200 mg Intravenous Once Alvy Bimler, Bren Steers, MD       CARBOplatin (PARAPLATIN) 550 mg in sodium chloride 0.9 % 250 mL chemo infusion  550 mg Intravenous Once Alvy Bimler, Jane Broughton, MD       dexamethasone (DECADRON) 10 mg in sodium chloride 0.9 % 50 mL IVPB  10 mg Intravenous Once Alvy Bimler, Delno Blaisdell, MD 204 mL/hr at 02/09/22 1044 10 mg at 02/09/22 1044   etoposide (VEPESID) 210 mg in sodium chloride 0.9 % 1,000 mL chemo infusion  100 mg/m2 (Treatment Plan Recorded) Intravenous Once Alvy Bimler, Zykiria Bruening, MD       fosaprepitant (EMEND) 150 mg in sodium chloride 0.9 % 145 mL IVPB  150 mg Intravenous Once Alvy Bimler, Kamirah Shugrue, MD       heparin lock flush 100 unit/mL  500 Units Intracatheter Once PRN Alvy Bimler, Vonne Mcdanel, MD       sodium chloride flush (NS) 0.9 % injection 10 mL  10 mL Intracatheter PRN [name], MD        SUMMARY OF ONCOLOGIC HISTORY: Oncology History  Metastatic cancer to brain ([place])  11/27/2021 Initial Diagnosis   Metastatic cancer to brain ([place])   12/03/2021 -  Chemotherapy   Patient is on Treatment Plan : LUNG SMALL CELL Carboplatin D1 / Etoposide D1-3 q21d     Metastatic primary lung cancer, right ([place])  11/26/2021 Imaging   There is 2.9 cm lobulated noncalcified pleural-based nodule in the right upper lobe suggesting possible primary malignant neoplasm. There are pathologically enlarged bulky lymph nodes in the mediastinum and both hilar regions, more so on the right side suggesting metastatic lymphadenopathy. There is extrinsic pressure over the right main pulmonary artery and superior vena cava by the enlarged lymph nodes in mediastinum.   Increased interstitial markings and small scattered nodules are seen in the right lung which may be part of pneumonia or pulmonary metastatic disease.   There are nodular  densities in both adrenals largest measuring 3 cm in the left adrenal suggesting possible metastatic disease. There are enlarged lymph nodes in the retroperitoneum and mesentery largest measuring 5.9 cm in maximum diameter suggesting metastatic lymphadenopathy.   There are multiple cysts in the liver. There are other indeterminate low-density lesions of varying sizes in the liver. Possibility of hepatic metastatic disease is not excluded.   Head of the pancreas is enlarged in size with lobulated margins. Possibility of neoplastic process in the head of the pancreas is not excluded. Follow-up PET-CT and biopsy as warranted should be considered.   Coronary artery calcifications are seen. Other findings as described in the body of the report.   11/26/2021 - 12/05/2021 Hospital Admission   He was admitted to the hospital and found to have metastatic lung cancer   11/27/2021 Initial Diagnosis   Metastatic primary lung cancer, right ([place])   11/27/2021 Imaging   MRI HEAD IMPRESSION:   1. 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 Mild localized edema with hemorrhagic blood products about several of these lesions without significant regional mass effect or midline shift.  2. 3.2 x 6.6  x 4.8 cm enhancing extra-axial mass at the posterior aspect of the superior sagittal sinus, indeterminate. While this finding could reflect an additional dural-based metastasis, a possible concomitant meningioma could also be considered.   MRV HEAD IMPRESSION:   1. Invasion and obliteration of the posterior superior sagittal sinus by the above described dural based mass. Small amount of additional nonocclusive thrombus within the superior sagittal sinus anterior to the mass as above. 2. Remainder of the major dural sinuses are otherwise patent.     11/28/2021 Pathology Results   A. PERITONEAL MASS, UPPER ABDOMINAL MIDLINE, BIOPSY:  High-grade  neuroendocrine carcinoma, small cell type (Small cell  carcinoma).  Please see comment.   The following immunostains are performed on block A2 with appropriate  controls:  CK7: Negative.  TTF-1: Positive.  Chromogranin: Negative.  Synoptophysin: Negative.  CD56: Positive.  Ki-67: Very high proliferative index (more than 90%).   Comment: Morphologic features of the neoplasm are most consistent with metastasis from a primary small cell carcinoma in the lung.    12/03/2021 -  Chemotherapy   Patient is on Treatment Plan : LUNG SMALL CELL Carboplatin D1 / Etoposide D1-3 q21d     12/03/2021 -  Chemotherapy   Patient is on Treatment Plan :  LUNG SMALL CELL Carboplatin D1 / Etoposide D1-3 q21d     12/09/2021 Cancer Staging   Staging form: Lung, AJCC 8th Edition - Clinical: Stage IV (cT4, cN3, pM1) - Signed by [name], MD on 12/09/2021    01/14/2022 PET scan   1. Since the CTs of 11/26/2021, response to therapy of widespread metastatic small-cell carcinoma as detailed above. Near complete resolution right upper lobe primary with significant improvement in thoracic nodal, adrenal, abdominal nodal, and peritoneal metastasis. 2. No new or progressive disease. 3. Development of left-sided hypermetabolic pulmonary opacity. Suspicious for infection.       PHYSICAL EXAMINATION: ECOG PERFORMANCE STATUS: 1 - Symptomatic but completely ambulatory  Vitals:   02/09/22 0956  BP: (!) 121/97  Pulse: 98  Resp: 18  Temp: 98 F (36.7 C)  SpO2: 95%   Filed Weights   02/09/22 0956  Weight: 183 lb 3.2 oz (83.1 kg)    GENERAL:alert, no distress and comfortable NEURO: alert & oriented x 3 with fluent speech, no focal motor/sensory deficits  LABORATORY DATA:  I have reviewed the data as listed    Component Value Date/Time   NA 138 02/09/2022 0916   K 3.9 02/09/2022 0916   CL 101 02/09/2022 0916   CO2 29 02/09/2022 0916   GLUCOSE 158 (H) 02/09/2022 0916   BUN 16 02/09/2022 0916    CREATININE 0.67 02/09/2022 0916   CREATININE 0.55 (L) 12/19/2021 1426   CALCIUM 9.4 02/09/2022 0916   PROT 6.7 02/09/2022 0916   ALBUMIN 3.7 02/09/2022 0916   AST 9 (L) 02/09/2022 0916   AST 16 12/19/2021 1426   ALT 13 02/09/2022 0916   ALT 48 (H) 12/19/2021 1426   ALKPHOS 98 02/09/2022 0916   BILITOT 0.7 02/09/2022 0916   BILITOT 0.5 12/19/2021 1426   GFRNONAA >60 02/09/2022 0916   GFRNONAA >60 12/19/2021 1426   GFRAA >60 01/01/2019 0726    No results found for: SPEP, UPEP  Lab Results  Component Value Date   WBC 12.4 (H) 02/09/2022   NEUTROABS 11.2 (H) 02/09/2022   HGB 11.0 (L) 02/09/2022   HCT 33.4 (L) 02/09/2022   MCV 98.2 02/09/2022   PLT 264 02/09/2022      Chemistry  Component Value Date/Time   NA 138 02/09/2022 0916   K 3.9 02/09/2022 0916   CL 101 02/09/2022 0916   CO2 29 02/09/2022 0916   BUN 16 02/09/2022 0916   CREATININE 0.67 02/09/2022 0916   CREATININE 0.55 (L) 12/19/2021 1426      Component Value Date/Time   CALCIUM 9.4 02/09/2022 0916   ALKPHOS 98 02/09/2022 0916   AST 9 (L) 02/09/2022 0916   AST 16 12/19/2021 1426   ALT 13 02/09/2022 0916   ALT 48 (H) 12/19/2021 1426   BILITOT 0.7 02/09/2022 0916   BILITOT 0.5 12/19/2021 1426       RADIOGRAPHIC STUDIES: I have personally reviewed the radiological images as listed and agreed with the findings in the report. CT Angio Chest PE W and/or Wo Contrast  Result Date: 01/27/2022 CLINICAL DATA:  Shortness of breath. EXAM: CT ANGIOGRAPHY CHEST WITH CONTRAST TECHNIQUE: Multidetector CT imaging of the chest was performed using the standard protocol during bolus administration of intravenous contrast. Multiplanar CT image reconstructions and MIPs were obtained to evaluate the vascular anatomy. RADIATION DOSE REDUCTION: This exam was performed according to the departmental dose-optimization program which includes automated exposure control, adjustment of the mA and/or kV according to patient size and/or  use of iterative reconstruction technique. CONTRAST:  36m OMNIPAQUE IOHEXOL 350 MG/ML SOLN COMPARISON:  November 26, 2021. FINDINGS: Cardiovascular: Satisfactory opacification of the pulmonary arteries to the segmental level. No evidence of pulmonary embolism. Normal heart size. No pericardial effusion. Atherosclerosis of thoracic aorta is noted. 4.1 cm ascending thoracic aortic aneurysm is noted. Coronary calcifications are noted suggesting coronary artery disease. Mediastinum/Nodes: Significantly smaller precarinal and right hilar mass is noted most consistent with post treatment change for history of lung cancer. Thyroid gland is unremarkable. The esophagus is unremarkable. Lungs/Pleura: No pneumothorax or pleural effusion is noted. Right upper lobe mass noted on prior exam is nearly resolved. Left lower lobe reticular opacities are noted concerning for asymmetric edema or atypical inflammation or post treatment change. Upper Abdomen: Stable 2.3 cm right adrenal nodule is noted. Stable hepatic cyst. Musculoskeletal: No chest wall abnormality. No acute or significant osseous findings. Review of the MIP images confirms the above findings. IMPRESSION: No definite evidence of pulmonary embolus. 4.1 cm ascending thoracic aortic aneurysm. Recommend annual imaging followup by CTA or MRA. This recommendation follows 2010 ACCF/AHA/AATS/ACR/ASA/SCA/SCAI/SIR/STS/SVM Guidelines for the Diagnosis and Management of Patients with Thoracic Aortic Disease. Circulation. 2010; 121:: Q825-O037 Aortic aneurysm NOS (ICD10-I71.9). Coronary artery calcifications are noted suggesting coronary artery disease. Mediastinal adenopathy and right lung mass are significantly decreased consistent with post treatment change. Left lower lobe reticular opacities are noted concerning for asymmetric edema or atypical inflammation or post treatment change. Stable right adrenal nodule. Aortic Atherosclerosis (ICD10-I70.0). Electronically Signed   By:  JMarijo ConceptionM.D.   On: 01/27/2022 16:22   NM PET Image Initial (PI) Skull Base To Thigh  Result Date: 01/13/2022 CLINICAL DATA:  Initial treatment strategy for small-cell lung cancer. EXAM: NUCLEAR MEDICINE PET SKULL BASE TO THIGH TECHNIQUE: 9.4 mCi F-18 FDG was injected intravenously. Full-ring PET imaging was performed from the skull base to thigh after the radiotracer. CT data was obtained and used for attenuation correction and anatomic localization. Fasting blood glucose: 175 mg/dl COMPARISON:  11/26/2021 chest abdomen and pelvic CTs. FINDINGS: Mediastinal blood pool activity: SUV max 2.7 Liver activity: SUV max NA NECK: Right vocal cord hypermetabolism may relate to left-sided paralysis, but is nonspecific. No well-defined mass in this area. No  cervical nodal hypermetabolism. Incidental CT findings: Bilateral carotid atherosclerosis. No cervical adenopathy. CHEST: Right paratracheal node measures 2.4 cm and a S.U.V. max of 4.4 on 83/4. This is significantly decreased from on the order of 6.5 cm on the prior CT (when remeasured). Other areas of confluent mediastinal adenopathy have significantly improved. The presumed right upper lobe primary has nearly completely resolved, with only linear opacity remaining including on 16/8. Low-level hypermetabolism at a S.U.V. max of 2.2. Development of posterior left upper and superior segment left lower lobe septal thickening and ground-glass opacity with hypermetabolism. Example at a S.U.V. max of 4.0 including on 35/8. Incidental CT findings: Centrilobular emphysema. Right Port-A-Cath tip superior caval/atrial junction. Aortic and coronary artery calcification. ABDOMEN/PELVIS: Right adrenal nodule measures 1.9 cm and a S.U.V. max of 4.6 on 118/4. Similar in size 2.0 cm on the prior. The left adrenal nodule is decreased in size and there is no hypermetabolism in this area. Gastrohepatic ligament node measures 2.5 cm and a S.U.V. max of 3.2 on 125/4 versus 5.4 cm  on the prior CT (when remeasured). An omental 1.6 cm nodule measures a S.U.V. max of 3.1 on 141/4 versus 4.4 cm on the prior CT (when remeasured). Incidental CT findings: Hepatic cysts. Other liver lesions are not definitely simple cysts, but are without correlate hypermetabolism. Example hepatic dome at 1.0 cm on 103/4, similar to on the prior diagnostic CT. Abdominal aortic atherosclerosis.  Prostatomegaly. SKELETON: No abnormal marrow activity. Incidental CT findings: none IMPRESSION: 1. Since the CTs of 11/26/2021, response to therapy of widespread metastatic small-cell carcinoma as detailed above. Near complete resolution right upper lobe primary with significant improvement in thoracic nodal, adrenal, abdominal nodal, and peritoneal metastasis. 2. No new or progressive disease. 3. Development of left-sided hypermetabolic pulmonary opacity. Suspicious for infection. Electronically Signed   By: Abigail Miyamoto M.D.   On: 01/13/2022 15:24   DG Chest Port 1 View  Result Date: 01/27/2022 CLINICAL DATA:  Short of breath, small cell lung cancer EXAM: PORTABLE CHEST 1 VIEW COMPARISON:  01/13/2022, 11/30/2021 FINDINGS: Single frontal view of the chest demonstrates right chest wall port via internal jugular approach tip in the region of the superior vena cava. Cardiac silhouette is stable. Marked decrease in size of the right hilar mass consistent with response to therapy. Stable left lower lobe airspace disease consistent with pneumonia or aspiration. No effusion or pneumothorax. No acute fractures. IMPRESSION: 1. Persistent left lower lobe airspace disease, most consistent with pneumonia. Continued follow-up to resolution after appropriate medical management is recommended. 2. Decreased right hilar mass, consistent with response to therapy of known small cell lung cancer. Electronically Signed   By: Randa Ngo M.D.   On: 01/27/2022 15:09

## 2022-02-09 NOTE — Patient Instructions (Signed)
[facility] ONCOLOGY  Discharge Instructions: Thank you for choosing [facility] to provide your oncology and hematology care.   If you have a lab appointment with the [facility], please go directly to the [facility] and check in at the registration area.   Wear comfortable clothing and clothing appropriate for easy access to any Portacath or PICC line.   We strive to give you quality time with your provider. You may need to reschedule your appointment if you arrive late (15 or more minutes).  Arriving late affects you and other patients whose appointments are after yours.  Also, if you miss three or more appointments without notifying the office, you may be dismissed from the clinic at the providers discretion.      For prescription refill requests, have your pharmacy contact our office and allow 72 hours for refills to be completed.    Today you received the following chemotherapy and/or immunotherapy agents: Carboplatin, Tecentriq, Etoposide.       To help prevent nausea and vomiting after your treatment, we encourage you to take your nausea medication as directed.  BELOW ARE SYMPTOMS THAT SHOULD BE REPORTED IMMEDIATELY: *FEVER GREATER THAN 100.4 F (38 C) OR HIGHER *CHILLS OR SWEATING *NAUSEA AND VOMITING THAT IS NOT CONTROLLED WITH YOUR NAUSEA MEDICATION *UNUSUAL SHORTNESS OF BREATH *UNUSUAL BRUISING OR BLEEDING *URINARY PROBLEMS (pain or burning when urinating, or frequent urination) *BOWEL PROBLEMS (unusual diarrhea, constipation, pain near the anus) TENDERNESS IN MOUTH AND THROAT WITH OR WITHOUT PRESENCE OF ULCERS (sore throat, sores in mouth, or a toothache) UNUSUAL RASH, SWELLING OR PAIN  UNUSUAL VAGINAL DISCHARGE OR ITCHING   Items with * indicate a potential emergency and should be followed up as soon as possible or go to the Emergency Department if any problems should occur.  Please show the CHEMOTHERAPY ALERT CARD or IMMUNOTHERAPY  ALERT CARD at check-in to the Emergency Department and triage nurse.  Should you have questions after your visit or need to cancel or reschedule your appointment, please contact [facility]  Dept: [phone]  and follow the prompts.  Office hours are 8:00 a.m. to 4:30 p.m. Monday - Friday. Please note that voicemails left after 4:00 p.m. may not be returned until the following business day.  We are closed weekends and major holidays. You have access to a nurse at all times for urgent questions. Please call the main number to the clinic Dept: [phone] and follow the prompts.   For any non-urgent questions, you may also contact your provider using MyChart. We now offer e-Visits for anyone 60 and older to request care online for non-urgent symptoms. For details visit [email or web address].   Also download the MyChart app! Go to the app store, search "MyChart", open the app, select [facility], and log in with your MyChart username and password.  Due to Covid, a mask is required upon entering the hospital/clinic. If you do not have a mask, one will be given to you upon arrival. For doctor visits, patients may have 1 support person aged 83 or older with them. For treatment visits, patients cannot have anyone with them due to current Covid guidelines and our immunocompromised population.

## 2022-02-09 NOTE — Assessment & Plan Note (Signed)
He continues to have slight weight loss The dose of treatment is readjusted based on current weight I have reviewed documentation from recent emergency room visit His vitals are now stabilizing and he is clinically improving We will continue treatment as scheduled He has appointment to see a radiation oncologist for follow-up I plan 6 cycles of chemotherapy before switching him to maintenance immunotherapy

## 2022-02-10 ENCOUNTER — Inpatient Hospital Stay: Payer: BC Managed Care – PPO

## 2022-02-10 VITALS — BP 110/69 | HR 105 | Temp 98.2°F | Resp 18

## 2022-02-10 DIAGNOSIS — C3491 Malignant neoplasm of unspecified part of right bronchus or lung: Secondary | ICD-10-CM | POA: Diagnosis not present

## 2022-02-10 DIAGNOSIS — C7931 Secondary malignant neoplasm of brain: Secondary | ICD-10-CM

## 2022-02-10 MED ORDER — SODIUM CHLORIDE 0.9 % IV SOLN
10.0000 mg | Freq: Once | INTRAVENOUS | Status: AC
  Administered 2022-02-10: 10 mg via INTRAVENOUS
  Filled 2022-02-10: qty 10

## 2022-02-10 MED ORDER — SODIUM CHLORIDE 0.9% FLUSH
10.0000 mL | INTRAVENOUS | Status: DC | PRN
  Administered 2022-02-10: 10 mL

## 2022-02-10 MED ORDER — SODIUM CHLORIDE 0.9 % IV SOLN
100.0000 mg/m2 | Freq: Once | INTRAVENOUS | Status: AC
  Administered 2022-02-10: 210 mg via INTRAVENOUS
  Filled 2022-02-10: qty 10.5

## 2022-02-10 MED ORDER — SODIUM CHLORIDE 0.9 % IV SOLN: Freq: Once | INTRAVENOUS | Status: AC

## 2022-02-10 MED ORDER — HEPARIN SOD (PORK) LOCK FLUSH 100 UNIT/ML IV SOLN
500.0000 [IU] | Freq: Once | INTRAVENOUS | Status: AC | PRN
  Administered 2022-02-10: 500 [IU]

## 2022-02-10 MED FILL — Dexamethasone Sodium Phosphate Inj 100 MG/10ML: INTRAMUSCULAR | Qty: 1 | Status: AC

## 2022-02-10 NOTE — Progress Notes (Signed)
Okay to treat with elevated pulse per Dr. Alvy Bimler

## 2022-02-10 NOTE — Patient Instructions (Signed)
[facility]  Discharge Instructions: Thank you for choosing [facility] to provide your oncology and hematology care.   If you have a lab appointment with the Cancer Center, please go directly to the Cancer Center and check in at the registration area.   Wear comfortable clothing and clothing appropriate for easy access to any Portacath or PICC line.   We strive to give you quality time with your provider. You may need to reschedule your appointment if you arrive late (15 or more minutes).  Arriving late affects you and other patients whose appointments are after yours.  Also, if you miss three or more appointments without notifying the office, you may be dismissed from the clinic at the provider's discretion.      For prescription refill requests, have your pharmacy contact our office and allow 72 hours for refills to be completed.    Today you received the following chemotherapy and/or immunotherapy agents: etoposide.     To help prevent nausea and vomiting after your treatment, we encourage you to take your nausea medication as directed.  BELOW ARE SYMPTOMS THAT SHOULD BE REPORTED IMMEDIATELY: . *FEVER GREATER THAN 100.4 F (38 C) OR HIGHER . *CHILLS OR SWEATING . *NAUSEA AND VOMITING THAT IS NOT CONTROLLED WITH YOUR NAUSEA MEDICATION . *UNUSUAL SHORTNESS OF BREATH . *UNUSUAL BRUISING OR BLEEDING . *URINARY PROBLEMS (pain or burning when urinating, or frequent urination) . *BOWEL PROBLEMS (unusual diarrhea, constipation, pain near the anus) . TENDERNESS IN MOUTH AND THROAT WITH OR WITHOUT PRESENCE OF ULCERS (sore throat, sores in mouth, or a toothache) . UNUSUAL RASH, SWELLING OR PAIN  . UNUSUAL VAGINAL DISCHARGE OR ITCHING   Items with * indicate a potential emergency and should be followed up as soon as possible or go to the Emergency Department if any problems should occur.  Please show the CHEMOTHERAPY ALERT CARD or IMMUNOTHERAPY ALERT  CARD at check-in to the Emergency Department and triage nurse.  Should you have questions after your visit or need to cancel or reschedule your appointment, please contact [facility]  Dept: [phone]  and follow the prompts.  Office hours are 8:00 a.m. to 4:30 p.m. Monday - Friday. Please note that voicemails left after 4:00 p.m. may not be returned until the following business day.  We are closed weekends and major holidays. You have access to a nurse at all times for urgent questions. Please call the main number to the clinic Dept: [phone] and follow the prompts.   For any non-urgent questions, you may also contact your provider using MyChart. We now offer e-Visits for anyone 18 and older to request care online for non-urgent symptoms. For details visit [email or web address].   Also download the MyChart app! Go to the app store, search "MyChart", open the app, select , and log in with your MyChart username and password.  Due to Covid, a mask is required upon entering the hospital/clinic. If you do not have a mask, one will be given to you upon arrival. For doctor visits, patients may have 1 support person aged 18 or older with them. For treatment visits, patients cannot have anyone with them due to current Covid guidelines and our immunocompromised population.   

## 2022-02-11 ENCOUNTER — Other Ambulatory Visit: Payer: Self-pay

## 2022-02-11 ENCOUNTER — Inpatient Hospital Stay: Payer: BC Managed Care – PPO

## 2022-02-11 VITALS — BP 104/64 | HR 93 | Temp 98.0°F | Resp 18

## 2022-02-11 DIAGNOSIS — C3491 Malignant neoplasm of unspecified part of right bronchus or lung: Secondary | ICD-10-CM | POA: Diagnosis not present

## 2022-02-11 DIAGNOSIS — C7931 Secondary malignant neoplasm of brain: Secondary | ICD-10-CM

## 2022-02-11 MED ORDER — SODIUM CHLORIDE 0.9% FLUSH
10.0000 mL | INTRAVENOUS | Status: DC | PRN
  Administered 2022-02-11: 10 mL

## 2022-02-11 MED ORDER — SODIUM CHLORIDE 0.9 % IV SOLN
100.0000 mg/m2 | Freq: Once | INTRAVENOUS | Status: AC
  Administered 2022-02-11: 210 mg via INTRAVENOUS
  Filled 2022-02-11: qty 10.5

## 2022-02-11 MED ORDER — HEPARIN SOD (PORK) LOCK FLUSH 100 UNIT/ML IV SOLN
500.0000 [IU] | Freq: Once | INTRAVENOUS | Status: AC | PRN
  Administered 2022-02-11: 500 [IU]

## 2022-02-11 MED ORDER — SODIUM CHLORIDE 0.9 % IV SOLN: Freq: Once | INTRAVENOUS | Status: AC

## 2022-02-11 MED ORDER — SODIUM CHLORIDE 0.9 % IV SOLN
10.0000 mg | Freq: Once | INTRAVENOUS | Status: AC
  Administered 2022-02-11: 10 mg via INTRAVENOUS
  Filled 2022-02-11: qty 10

## 2022-02-11 NOTE — Patient Instructions (Signed)
[facility]  Discharge Instructions: Thank you for choosing [facility] to provide your oncology and hematology care.   If you have a lab appointment with the Cancer Center, please go directly to the Cancer Center and check in at the registration area.   Wear comfortable clothing and clothing appropriate for easy access to any Portacath or PICC line.   We strive to give you quality time with your provider. You may need to reschedule your appointment if you arrive late (15 or more minutes).  Arriving late affects you and other patients whose appointments are after yours.  Also, if you miss three or more appointments without notifying the office, you may be dismissed from the clinic at the provider's discretion.      For prescription refill requests, have your pharmacy contact our office and allow 72 hours for refills to be completed.    Today you received the following chemotherapy and/or immunotherapy agents: etoposide.     To help prevent nausea and vomiting after your treatment, we encourage you to take your nausea medication as directed.  BELOW ARE SYMPTOMS THAT SHOULD BE REPORTED IMMEDIATELY: . *FEVER GREATER THAN 100.4 F (38 C) OR HIGHER . *CHILLS OR SWEATING . *NAUSEA AND VOMITING THAT IS NOT CONTROLLED WITH YOUR NAUSEA MEDICATION . *UNUSUAL SHORTNESS OF BREATH . *UNUSUAL BRUISING OR BLEEDING . *URINARY PROBLEMS (pain or burning when urinating, or frequent urination) . *BOWEL PROBLEMS (unusual diarrhea, constipation, pain near the anus) . TENDERNESS IN MOUTH AND THROAT WITH OR WITHOUT PRESENCE OF ULCERS (sore throat, sores in mouth, or a toothache) . UNUSUAL RASH, SWELLING OR PAIN  . UNUSUAL VAGINAL DISCHARGE OR ITCHING   Items with * indicate a potential emergency and should be followed up as soon as possible or go to the Emergency Department if any problems should occur.  Please show the CHEMOTHERAPY ALERT CARD or IMMUNOTHERAPY ALERT  CARD at check-in to the Emergency Department and triage nurse.  Should you have questions after your visit or need to cancel or reschedule your appointment, please contact [facility]  Dept: [phone]  and follow the prompts.  Office hours are 8:00 a.m. to 4:30 p.m. Monday - Friday. Please note that voicemails left after 4:00 p.m. may not be returned until the following business day.  We are closed weekends and major holidays. You have access to a nurse at all times for urgent questions. Please call the main number to the clinic Dept: [phone] and follow the prompts.   For any non-urgent questions, you may also contact your provider using MyChart. We now offer e-Visits for anyone 18 and older to request care online for non-urgent symptoms. For details visit [email or web address].   Also download the MyChart app! Go to the app store, search "MyChart", open the app, select [facility], and log in with your MyChart username and password.  Due to Covid, a mask is required upon entering the hospital/clinic. If you do not have a mask, one will be given to you upon arrival. For doctor visits, patients may have 1 support person aged 18 or older with them. For treatment visits, patients cannot have anyone with them due to current Covid guidelines and our immunocompromised population.   

## 2022-02-13 ENCOUNTER — Inpatient Hospital Stay: Payer: BC Managed Care – PPO

## 2022-02-13 ENCOUNTER — Other Ambulatory Visit: Payer: Self-pay

## 2022-02-13 VITALS — BP 96/65 | HR 98 | Temp 98.3°F | Resp 18

## 2022-02-13 DIAGNOSIS — C3491 Malignant neoplasm of unspecified part of right bronchus or lung: Secondary | ICD-10-CM | POA: Diagnosis not present

## 2022-02-13 DIAGNOSIS — C7931 Secondary malignant neoplasm of brain: Secondary | ICD-10-CM

## 2022-02-13 MED ORDER — PEGFILGRASTIM-CBQV 6 MG/0.6ML ~~LOC~~ SOSY
6.0000 mg | PREFILLED_SYRINGE | Freq: Once | SUBCUTANEOUS | Status: AC
  Administered 2022-02-13: 6 mg via SUBCUTANEOUS
  Filled 2022-02-13: qty 0.6

## 2022-02-13 NOTE — Patient Instructions (Signed)

## 2022-02-23 ENCOUNTER — Other Ambulatory Visit: Payer: Self-pay

## 2022-02-23 ENCOUNTER — Telehealth: Payer: Self-pay

## 2022-02-23 MED ORDER — PANTOPRAZOLE SODIUM 40 MG PO TBEC: 40.0000 mg | DELAYED_RELEASE_TABLET | Freq: Every day | ORAL | 3 refills | Status: AC

## 2022-02-23 NOTE — Telephone Encounter (Signed)
[name] called and left a message requesting refill on Protonix and dexamethasone Rx to [facility] in [place].  ? ?Next appt 3/13. Ryan [name] is not eating as well as he was and is not moving around much. [name] thinks that he needs the dexamethasone. ?

## 2022-02-23 NOTE — Telephone Encounter (Signed)
Pls refill protonix ?Tell Ryan [name] every time he takes dexamethasone it could prevent the immunotherapy from working ?I do not recommend it, but if she thinks he needs it we can refill ?

## 2022-02-23 NOTE — Telephone Encounter (Signed)
Rx sent for Protonix. Given below message. Ryan [name] verbalized understanding and will not take the dexamethasone Rx. ?She will call the office back if needed. ?

## 2022-02-27 MED FILL — Fosaprepitant Dimeglumine For IV Infusion 150 MG (Base Eq): INTRAVENOUS | Qty: 5 | Status: AC

## 2022-02-27 MED FILL — Dexamethasone Sodium Phosphate Inj 100 MG/10ML: INTRAMUSCULAR | Qty: 1 | Status: AC

## 2022-03-02 ENCOUNTER — Inpatient Hospital Stay: Payer: BC Managed Care – PPO | Attending: Hematology and Oncology

## 2022-03-02 ENCOUNTER — Other Ambulatory Visit: Payer: Self-pay

## 2022-03-02 ENCOUNTER — Encounter: Payer: Self-pay | Admitting: Hematology and Oncology

## 2022-03-02 ENCOUNTER — Inpatient Hospital Stay: Payer: BC Managed Care – PPO

## 2022-03-02 ENCOUNTER — Inpatient Hospital Stay: Payer: BC Managed Care – PPO | Admitting: Nutrition

## 2022-03-02 ENCOUNTER — Inpatient Hospital Stay: Payer: BC Managed Care – PPO | Admitting: Hematology and Oncology

## 2022-03-02 DIAGNOSIS — I9589 Other hypotension: Secondary | ICD-10-CM

## 2022-03-02 DIAGNOSIS — Z5112 Encounter for antineoplastic immunotherapy: Secondary | ICD-10-CM | POA: Diagnosis present

## 2022-03-02 DIAGNOSIS — C7931 Secondary malignant neoplasm of brain: Secondary | ICD-10-CM

## 2022-03-02 DIAGNOSIS — C3491 Malignant neoplasm of unspecified part of right bronchus or lung: Secondary | ICD-10-CM | POA: Diagnosis present

## 2022-03-02 DIAGNOSIS — E119 Type 2 diabetes mellitus without complications: Secondary | ICD-10-CM

## 2022-03-02 DIAGNOSIS — Z79899 Other long term (current) drug therapy: Secondary | ICD-10-CM | POA: Insufficient documentation

## 2022-03-02 DIAGNOSIS — R5381 Other malaise: Secondary | ICD-10-CM

## 2022-03-02 DIAGNOSIS — R634 Abnormal weight loss: Secondary | ICD-10-CM

## 2022-03-02 DIAGNOSIS — Z5111 Encounter for antineoplastic chemotherapy: Secondary | ICD-10-CM | POA: Diagnosis present

## 2022-03-02 LAB — CBC WITH DIFFERENTIAL/PLATELET
Abs Immature Granulocytes: 0.06 10*3/uL (ref 0.00–0.07)
Basophils Absolute: 0 10*3/uL (ref 0.0–0.1)
Basophils Relative: 0 %
Eosinophils Absolute: 0 10*3/uL (ref 0.0–0.5)
Eosinophils Relative: 0 %
HCT: 31.8 % — ABNORMAL LOW (ref 39.0–52.0)
Hemoglobin: 10.7 g/dL — ABNORMAL LOW (ref 13.0–17.0)
Immature Granulocytes: 1 %
Lymphocytes Relative: 11 %
Lymphs Abs: 1 10*3/uL (ref 0.7–4.0)
MCH: 32.7 pg (ref 26.0–34.0)
MCHC: 33.6 g/dL (ref 30.0–36.0)
MCV: 97.2 fL (ref 80.0–100.0)
Monocytes Absolute: 0.8 10*3/uL (ref 0.1–1.0)
Monocytes Relative: 8 %
Neutro Abs: 7.6 10*3/uL (ref 1.7–7.7)
Neutrophils Relative %: 80 %
Platelets: 303 10*3/uL (ref 150–400)
RBC: 3.27 MIL/uL — ABNORMAL LOW (ref 4.22–5.81)
RDW: 16.9 % — ABNORMAL HIGH (ref 11.5–15.5)
WBC: 9.5 10*3/uL (ref 4.0–10.5)
nRBC: 0 % (ref 0.0–0.2)

## 2022-03-02 LAB — COMPREHENSIVE METABOLIC PANEL
ALT: 21 U/L (ref 0–44)
AST: 11 U/L — ABNORMAL LOW (ref 15–41)
Albumin: 3.8 g/dL (ref 3.5–5.0)
Alkaline Phosphatase: 87 U/L (ref 38–126)
Anion gap: 9 (ref 5–15)
BUN: 12 mg/dL (ref 8–23)
CO2: 26 mmol/L (ref 22–32)
Calcium: 9.1 mg/dL (ref 8.9–10.3)
Chloride: 103 mmol/L (ref 98–111)
Creatinine, Ser: 0.58 mg/dL — ABNORMAL LOW (ref 0.61–1.24)
GFR, Estimated: >60 mL/min (ref >60)
Glucose, Bld: 142 mg/dL — ABNORMAL HIGH (ref 70–99)
Potassium: 3.1 mmol/L — ABNORMAL LOW (ref 3.5–5.1)
Sodium: 138 mmol/L (ref 135–145)
Total Bilirubin: 0.8 mg/dL (ref 0.3–1.2)
Total Protein: 6.4 g/dL — ABNORMAL LOW (ref 6.5–8.1)

## 2022-03-02 LAB — SAMPLE TO BLOOD BANK

## 2022-03-02 LAB — TSH: TSH: 1.784 u[IU]/mL (ref 0.320–4.118)

## 2022-03-02 MED ORDER — PALONOSETRON HCL INJECTION 0.25 MG/5ML
0.2500 mg | Freq: Once | INTRAVENOUS | Status: AC
  Administered 2022-03-02: 0.25 mg via INTRAVENOUS
  Filled 2022-03-02: qty 5

## 2022-03-02 MED ORDER — SODIUM CHLORIDE 0.9 % IV SOLN
150.0000 mg | Freq: Once | INTRAVENOUS | Status: AC
  Administered 2022-03-02: 150 mg via INTRAVENOUS
  Filled 2022-03-02: qty 150

## 2022-03-02 MED ORDER — SODIUM CHLORIDE 0.9 % IV SOLN: Freq: Once | INTRAVENOUS | Status: AC

## 2022-03-02 MED ORDER — SODIUM CHLORIDE 0.9% FLUSH
10.0000 mL | INTRAVENOUS | Status: DC | PRN
  Administered 2022-03-02: 10 mL

## 2022-03-02 MED ORDER — SODIUM CHLORIDE 0.9 % IV SOLN
1200.0000 mg | Freq: Once | INTRAVENOUS | Status: AC
  Administered 2022-03-02: 1200 mg via INTRAVENOUS
  Filled 2022-03-02: qty 20

## 2022-03-02 MED ORDER — SODIUM CHLORIDE 0.9 % IV SOLN
10.0000 mg | Freq: Once | INTRAVENOUS | Status: AC
  Administered 2022-03-02: 10 mg via INTRAVENOUS
  Filled 2022-03-02: qty 10

## 2022-03-02 MED ORDER — SODIUM CHLORIDE 0.9 % IV SOLN
546.5000 mg | Freq: Once | INTRAVENOUS | Status: AC
  Administered 2022-03-02: 550 mg via INTRAVENOUS
  Filled 2022-03-02: qty 55

## 2022-03-02 MED ORDER — SODIUM CHLORIDE 0.9 % IV SOLN
100.0000 mg/m2 | Freq: Once | INTRAVENOUS | Status: AC
  Administered 2022-03-02: 210 mg via INTRAVENOUS
  Filled 2022-03-02: qty 10.5

## 2022-03-02 MED ORDER — SODIUM CHLORIDE 0.9% FLUSH
10.0000 mL | Freq: Once | INTRAVENOUS | Status: AC
  Administered 2022-03-02: 10 mL

## 2022-03-02 MED ORDER — HEPARIN SOD (PORK) LOCK FLUSH 100 UNIT/ML IV SOLN
500.0000 [IU] | Freq: Once | INTRAVENOUS | Status: AC | PRN
  Administered 2022-03-02: 500 [IU]

## 2022-03-02 NOTE — Progress Notes (Signed)
Ok to treat today with elevated heart rate of 115 per Dr. Alvy Bimler ?

## 2022-03-02 NOTE — Patient Instructions (Signed)
[facility]  Discharge Instructions: ?Thank you for choosing [facility] to provide your oncology and hematology care.  ? ?If you have a lab appointment with the [facility], please go directly to the [facility] and check in at the registration area. ?  ?Wear comfortable clothing and clothing appropriate for easy access to any Portacath or PICC line.  ? ?We strive to give you quality time with your provider. You may need to reschedule your appointment if you arrive late (15 or more minutes).  Arriving late affects you and other patients whose appointments are after yours.  Also, if you miss three or more appointments without notifying the office, you may be dismissed from the clinic at the provider?s discretion.    ?  ?For prescription refill requests, have your pharmacy contact our office and allow 72 hours for refills to be completed.   ? ?Today you received the following chemotherapy and/or immunotherapy agents: Carboplatin, Tecentriq, Etoposide.     ?  ?To help prevent nausea and vomiting after your treatment, we encourage you to take your nausea medication as directed. ? ?BELOW ARE SYMPTOMS THAT SHOULD BE REPORTED IMMEDIATELY: ?*FEVER GREATER THAN 100.4 F (38 ?C) OR HIGHER ?*CHILLS OR SWEATING ?*NAUSEA AND VOMITING THAT IS NOT CONTROLLED WITH YOUR NAUSEA MEDICATION ?*UNUSUAL SHORTNESS OF BREATH ?*UNUSUAL BRUISING OR BLEEDING ?*URINARY PROBLEMS (pain or burning when urinating, or frequent urination) ?*BOWEL PROBLEMS (unusual diarrhea, constipation, pain near the anus) ?TENDERNESS IN MOUTH AND THROAT WITH OR WITHOUT PRESENCE OF ULCERS (sore throat, sores in mouth, or a toothache) ?UNUSUAL RASH, SWELLING OR PAIN  ?UNUSUAL VAGINAL DISCHARGE OR ITCHING  ? ?Items with * indicate a potential emergency and should be followed up as soon as possible or go to the Emergency Department if any problems should occur. ? ?Please show the CHEMOTHERAPY ALERT CARD or IMMUNOTHERAPY  ALERT CARD at check-in to the Emergency Department and triage nurse. ? ?Should you have questions after your visit or need to cancel or reschedule your appointment, please contact [facility]  Dept: [phone]  and follow the prompts.  Office hours are 8:00 a.m. to 4:30 p.m. Monday - Friday. Please note that voicemails left after 4:00 p.m. may not be returned until the following business day.  We are closed weekends and major holidays. You have access to a nurse at all times for urgent questions. Please call the main number to the clinic Dept: [phone] and follow the prompts. ? ? ?For any non-urgent questions, you may also contact your provider using MyChart. We now offer e-Visits for anyone 87 and older to request care online for non-urgent symptoms. For details visit [email or web address]. ?  ?Also download the MyChart app! Go to the app store, search "MyChart", open the app, select [facility], and log in with your MyChart username and password. ? ?Due to Covid, a mask is required upon entering the hospital/clinic. If you do not have a mask, one will be given to you upon arrival. For doctor visits, patients may have 1 support Dejha King aged 19 or older with them. For treatment visits, patients cannot have anyone with them due to current Covid guidelines and our immunocompromised population.  ? ?

## 2022-03-02 NOTE — Progress Notes (Signed)
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   ?Patient is receiving carboplatin and etoposide. ? ?Weight decreased and documented as 178.2 pounds on March 13 down from 183.2 pounds February 20.  Patient weighed 198 pounds January 30.  Patient has lost 10% body weight over 6 weeks which is significant. ? ?Labs noted: Potassium 3.1, Glucose 142, creatinine 0.58. ? ?Patient reports poor appetite and taste alterations.  He has increased fatigue.  He denies dysphagia and nausea and vomiting. ?Patient has backed off on drinking Ensure Enlive.  Reports his wife often makes a strawberry shake with protein powder.  He is not using baking soda and salt water rinses.   ? ?Reports his wife and physician are want him to eat more and not lose anymore weight. Patient is not concerned with weight loss. ? ?Nutrition diagnosis:  ?Food and nutrition related knowledge deficit continues. ? ?Intervention: ?Educated on the importance of weight stability during treatment.  Explained impact of inadequate nutrition on energy level and weight fluctuations. ?Encourage patient to try to eat or drink something every 2 hours. ?Provided additional recipes.  Recommended patient try baking soda and salt water rinses frequently throughout the day but especially before eating. ?Try to vary the taste of food to reduce taste fatigue. ? ?Monitoring, evaluation, goals: Patient will work to increase calories and protein to minimize weight loss. ? ?Next visit: Tuesday, April 4 during infusion. ? ?**Disclaimer: This note was dictated with voice recognition software. Similar sounding words can inadvertently be transcribed and this note may contain transcription errors which may not have been corrected upon publication of note.** ? ?

## 2022-03-02 NOTE — Assessment & Plan Note (Signed)
We will continue to monitor his blood sugar carefully ?

## 2022-03-02 NOTE — Progress Notes (Signed)
[facility] OFFICE PROGRESS NOTE  Patient Care Team: [name], MD as PCP - General (Hematology and Oncology) Valrie Hart, RN as Oncology Nurse Navigator (Oncology)  ASSESSMENT & PLAN:  Metastatic primary lung cancer, right ([place]) Overall, he continues to have weight loss due to poor appetite, altered taste sensation and others We discussed the importance of frequent oral intake and avoidance of weight loss I will adjust the dose of his treatment a little bit based on recent weight changes After cycle 6 of treatment, we will repeat imaging study and switch him to maintenance immunotherapy  DM2 (diabetes mellitus, type 2) ([place]) We will continue to monitor his blood sugar carefully  Weight loss He has appointment to see dietitian today I encouraged the patient to eat frequent small meals as tolerated  Physical debility We discussed the importance of physical therapy and rehab  Hypotension He has clinical hypotension and tachycardia due to recent poor oral intake We will proceed with treatment without delay  No orders of the defined types were placed in this encounter.   All questions were answered. The patient knows to call the clinic with any problems, questions or concerns. The total time spent in the appointment was 30 minutes encounter with patients including review of chart and various tests results, discussions about plan of care and coordination of care plan   [name], MD 03/02/2022 9:17 AM  INTERVAL HISTORY: Please see below for problem oriented charting. he returns for treatment follow-up seen prior to cycle 5 of treatment He has lost some weight due to poor appetite and altered taste sensation No dysphagia Denies recent nausea No new neurological deficits The patient complains of fatigue  REVIEW OF SYSTEMS:   Constitutional: Denies fevers, chills Eyes: Denies blurriness of vision Ears, nose, mouth, throat, and face: Denies mucositis or  sore throat Respiratory: Denies cough, dyspnea or wheezes Cardiovascular: Denies palpitation, chest discomfort or lower extremity swelling Gastrointestinal:  Denies nausea, heartburn or change in bowel habits Skin: Denies abnormal skin rashes Lymphatics: Denies new lymphadenopathy or easy bruising Behavioral/Psych: Mood is stable, no new changes  All other systems were reviewed with the patient and are negative.  I have reviewed the past medical history, past surgical history, social history and family history with the patient and they are unchanged from previous note.  ALLERGIES:  is allergic to hydralazine and percocet [oxycodone-acetaminophen].  MEDICATIONS:  Current Outpatient Medications  Medication Sig Dispense Refill   atorvastatin (LIPITOR) 10 MG tablet Take 1 tablet (10 mg total) by mouth daily. 30 tablet 3   lidocaine-prilocaine (EMLA) cream Apply 1 application topically as needed. 30 g 3   LORazepam (ATIVAN) 1 MG tablet Take 1 tablet (1 mg total) by mouth as needed for anxiety (take one tablet 30 minutes prior to MRI scans and may repeat just before the scan if needed.). 40 tablet 0   memantine (NAMENDA) 10 MG tablet Take 1 tablet (10 mg total) by mouth 2 (two) times daily. 60 tablet 4   mirtazapine (REMERON) 30 MG tablet Take 1 tablet (30 mg total) by mouth at bedtime. 90 tablet 1   ondansetron (ZOFRAN) 8 MG tablet Take 1 tablet (8 mg total) by mouth every 8 (eight) hours as needed for nausea or vomiting. 30 tablet 1   pantoprazole (PROTONIX) 40 MG tablet Take 1 tablet (40 mg total) by mouth daily. 30 tablet 3   No current facility-administered medications for this visit.   Facility-Administered Medications Ordered in Other Visits  Medication Dose Route Frequency Provider Last Rate Last Admin   atezolizumab (TECENTRIQ) 1,200 mg in sodium chloride 0.9 % 250 mL chemo infusion  1,200 mg Intravenous Once Alvy Bimler, Eretria Manternach, MD       CARBOplatin (PARAPLATIN) 550 mg in sodium chloride 0.9  % 250 mL chemo infusion  550 mg Intravenous Once Alvy Bimler, Samuella Rasool, MD       dexamethasone (DECADRON) 10 mg in sodium chloride 0.9 % 50 mL IVPB  10 mg Intravenous Once Alvy Bimler, Unice Vantassel, MD       etoposide (VEPESID) 210 mg in sodium chloride 0.9 % 1,000 mL chemo infusion  100 mg/m2 (Treatment Plan Recorded) Intravenous Once Alvy Bimler, Deadrick Stidd, MD       fosaprepitant (EMEND) 150 mg in sodium chloride 0.9 % 145 mL IVPB  150 mg Intravenous Once Alvy Bimler, Jessina Marse, MD       heparin lock flush 100 unit/mL  500 Units Intracatheter Once PRN Alvy Bimler, Keilee Denman, MD       palonosetron (ALOXI) injection 0.25 mg  0.25 mg Intravenous Once Alvy Bimler, Arth Nicastro, MD       sodium chloride flush (NS) 0.9 % injection 10 mL  10 mL Intracatheter PRN [name], MD        SUMMARY OF ONCOLOGIC HISTORY: Oncology History  Metastatic cancer to brain ([place])  11/27/2021 Initial Diagnosis   Metastatic cancer to brain ([place])   12/03/2021 -  Chemotherapy   Patient is on Treatment Plan : LUNG SMALL CELL Carboplatin D1 / Etoposide D1-3 q21d     Metastatic primary lung cancer, right ([place])  11/26/2021 Imaging   There is 2.9 cm lobulated noncalcified pleural-based nodule in the right upper lobe suggesting possible primary malignant neoplasm. There are pathologically enlarged bulky lymph nodes in the mediastinum and both hilar regions, more so on the right side suggesting metastatic lymphadenopathy. There is extrinsic pressure over the right main pulmonary artery and superior vena cava by the enlarged lymph nodes in mediastinum.   Increased interstitial markings and small scattered nodules are seen in the right lung which may be part of pneumonia or pulmonary metastatic disease.   There are nodular densities in both adrenals largest measuring 3 cm in the left adrenal suggesting possible metastatic disease. There are enlarged lymph nodes in the retroperitoneum and mesentery largest measuring 5.9 cm in maximum diameter suggesting metastatic lymphadenopathy.   There are  multiple cysts in the liver. There are other indeterminate low-density lesions of varying sizes in the liver. Possibility of hepatic metastatic disease is not excluded.   Head of the pancreas is enlarged in size with lobulated margins. Possibility of neoplastic process in the head of the pancreas is not excluded. Follow-up PET-CT and biopsy as warranted should be considered.   Coronary artery calcifications are seen. Other findings as described in the body of the report.   11/26/2021 - 12/05/2021 Hospital Admission   He was admitted to the hospital and found to have metastatic lung cancer   11/27/2021 Initial Diagnosis   Metastatic primary lung cancer, right ([place])   11/27/2021 Imaging   MRI HEAD IMPRESSION:   1. 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 Mild localized edema with hemorrhagic blood products about several of these lesions without significant regional mass effect or midline shift.  2. 3.2 x 6.6 x 4.8 cm enhancing extra-axial mass at the posterior aspect of the superior sagittal sinus, indeterminate. While this finding could reflect an additional dural-based metastasis, a possible concomitant meningioma could also be considered.  MRV HEAD IMPRESSION:   1. Invasion and obliteration of the posterior superior sagittal sinus by the above described dural based mass. Small amount of additional nonocclusive thrombus within the superior sagittal sinus anterior to the mass as above. 2. Remainder of the major dural sinuses are otherwise patent.     11/28/2021 Pathology Results   A. PERITONEAL MASS, UPPER ABDOMINAL MIDLINE, BIOPSY:  High-grade neuroendocrine carcinoma, small cell type (Small cell  carcinoma).  Please see comment.   The following immunostains are performed on block A2 with appropriate  controls:  CK7: Negative.  TTF-1: Positive.  Chromogranin: Negative.  Synoptophysin: Negative.  CD56: Positive.   Ki-67: Very high proliferative index (more than 90%).   Comment: Morphologic features of the neoplasm are most consistent with metastasis from a primary small cell carcinoma in the lung.    12/03/2021 -  Chemotherapy   Patient is on Treatment Plan : LUNG SMALL CELL Carboplatin D1 / Etoposide D1-3 q21d     12/03/2021 -  Chemotherapy   Patient is on Treatment Plan :  LUNG SMALL CELL Carboplatin D1 / Etoposide D1-3 q21d     12/09/2021 Cancer Staging   Staging form: Lung, AJCC 8th Edition - Clinical: Stage IV (cT4, cN3, pM1) - Signed by [name], MD on 12/09/2021    01/14/2022 PET scan   1. Since the CTs of 11/26/2021, response to therapy of widespread metastatic small-cell carcinoma as detailed above. Near complete resolution right upper lobe primary with significant improvement in thoracic nodal, adrenal, abdominal nodal, and peritoneal metastasis. 2. No new or progressive disease. 3. Development of left-sided hypermetabolic pulmonary opacity. Suspicious for infection.       PHYSICAL EXAMINATION: ECOG PERFORMANCE STATUS: 2 - Symptomatic, <50% confined to bed  Vitals:   03/02/22 0839  BP: 90/73  Pulse: (!) 115  Resp: 18  Temp: 98.2 F (36.8 C)  SpO2: 99%   Filed Weights   03/02/22 0839  Weight: 178 lb 3.2 oz (80.8 kg)    GENERAL:alert, no distress and comfortable NEURO: alert & oriented x 3 with fluent speech, no focal motor/sensory deficits  LABORATORY DATA:  I have reviewed the data as listed    Component Value Date/Time   NA 138 03/02/2022 0800   K 3.1 (L) 03/02/2022 0800   CL 103 03/02/2022 0800   CO2 26 03/02/2022 0800   GLUCOSE 142 (H) 03/02/2022 0800   BUN 12 03/02/2022 0800   CREATININE 0.58 (L) 03/02/2022 0800   CREATININE 0.55 (L) 12/19/2021 1426   CALCIUM 9.1 03/02/2022 0800   PROT 6.4 (L) 03/02/2022 0800   ALBUMIN 3.8 03/02/2022 0800   AST 11 (L) 03/02/2022 0800   AST 16 12/19/2021 1426   ALT 21 03/02/2022 0800   ALT 48 (H) 12/19/2021 1426    ALKPHOS 87 03/02/2022 0800   BILITOT 0.8 03/02/2022 0800   BILITOT 0.5 12/19/2021 1426   GFRNONAA >60 03/02/2022 0800   GFRNONAA >60 12/19/2021 1426   GFRAA >60 01/01/2019 0726    No results found for: SPEP, UPEP  Lab Results  Component Value Date   WBC 9.5 03/02/2022   NEUTROABS 7.6 03/02/2022   HGB 10.7 (L) 03/02/2022   HCT 31.8 (L) 03/02/2022   MCV 97.2 03/02/2022   PLT 303 03/02/2022      Chemistry      Component Value Date/Time   NA 138 03/02/2022 0800   K 3.1 (L) 03/02/2022 0800   CL 103 03/02/2022 0800   CO2 26 03/02/2022 0800  BUN 12 03/02/2022 0800   CREATININE 0.58 (L) 03/02/2022 0800   CREATININE 0.55 (L) 12/19/2021 1426      Component Value Date/Time   CALCIUM 9.1 03/02/2022 0800   ALKPHOS 87 03/02/2022 0800   AST 11 (L) 03/02/2022 0800   AST 16 12/19/2021 1426   ALT 21 03/02/2022 0800   ALT 48 (H) 12/19/2021 1426   BILITOT 0.8 03/02/2022 0800   BILITOT 0.5 12/19/2021 1426

## 2022-03-02 NOTE — Assessment & Plan Note (Signed)
Overall, he continues to have weight loss due to poor appetite, altered taste sensation and others ?We discussed the importance of frequent oral intake and avoidance of weight loss ?I will adjust the dose of his treatment a little bit based on recent weight changes ?After cycle 6 of treatment, we will repeat imaging study and switch him to maintenance immunotherapy ?

## 2022-03-02 NOTE — Assessment & Plan Note (Signed)
He has clinical hypotension and tachycardia due to recent poor oral intake ?We will proceed with treatment without delay ?

## 2022-03-02 NOTE — Assessment & Plan Note (Signed)
He has appointment to see dietitian today ?I encouraged the patient to eat frequent small meals as tolerated ?

## 2022-03-02 NOTE — Progress Notes (Signed)
Third dose of Tecentriq ok per Teldrin in pharmacy to run for 30 min instead of 1 hour per medication admin guidelines. ?

## 2022-03-02 NOTE — Assessment & Plan Note (Signed)
We discussed the importance of physical therapy and rehab ?

## 2022-03-03 ENCOUNTER — Ambulatory Visit: Payer: BC Managed Care – PPO

## 2022-03-03 ENCOUNTER — Inpatient Hospital Stay: Payer: BC Managed Care – PPO

## 2022-03-03 VITALS — BP 125/84 | HR 108 | Temp 98.5°F | Resp 18

## 2022-03-03 DIAGNOSIS — C3491 Malignant neoplasm of unspecified part of right bronchus or lung: Secondary | ICD-10-CM | POA: Diagnosis not present

## 2022-03-03 DIAGNOSIS — C7931 Secondary malignant neoplasm of brain: Secondary | ICD-10-CM

## 2022-03-03 MED ORDER — HEPARIN SOD (PORK) LOCK FLUSH 100 UNIT/ML IV SOLN
500.0000 [IU] | Freq: Once | INTRAVENOUS | Status: AC | PRN
  Administered 2022-03-03: 500 [IU]

## 2022-03-03 MED ORDER — SODIUM CHLORIDE 0.9 % IV SOLN
10.0000 mg | Freq: Once | INTRAVENOUS | Status: AC
  Administered 2022-03-03: 10 mg via INTRAVENOUS
  Filled 2022-03-03: qty 10

## 2022-03-03 MED ORDER — SODIUM CHLORIDE 0.9 % IV SOLN: Freq: Once | INTRAVENOUS | Status: AC

## 2022-03-03 MED ORDER — SODIUM CHLORIDE 0.9 % IV SOLN
100.0000 mg/m2 | Freq: Once | INTRAVENOUS | Status: AC
  Administered 2022-03-03: 210 mg via INTRAVENOUS
  Filled 2022-03-03: qty 10.5

## 2022-03-03 MED ORDER — SODIUM CHLORIDE 0.9% FLUSH
10.0000 mL | INTRAVENOUS | Status: DC | PRN
  Administered 2022-03-03: 10 mL

## 2022-03-03 NOTE — Progress Notes (Signed)
Per Dr. Alvy Bimler OK to trt w/ elevated HR ?

## 2022-03-03 NOTE — Patient Instructions (Signed)
[facility]  Discharge Instructions: ?Thank you for choosing [facility] to provide your oncology and hematology care.  ? ?If you have a lab appointment with the [facility], please go directly to the [facility] and check in at the registration area. ?  ?Wear comfortable clothing and clothing appropriate for easy access to any Portacath or PICC line.  ? ?We strive to give you quality time with your provider. You may need to reschedule your appointment if you arrive late (15 or more minutes).  Arriving late affects you and other patients whose appointments are after yours.  Also, if you miss three or more appointments without notifying the office, you may be dismissed from the clinic at the provider?s discretion.    ?  ?For prescription refill requests, have your pharmacy contact our office and allow 72 hours for refills to be completed.   ? ?Today you received the following chemotherapy and/or immunotherapy agents: Etoposide ?  ?To help prevent nausea and vomiting after your treatment, we encourage you to take your nausea medication as directed. ? ?BELOW ARE SYMPTOMS THAT SHOULD BE REPORTED IMMEDIATELY: ?*FEVER GREATER THAN 100.4 F (38 ?C) OR HIGHER ?*CHILLS OR SWEATING ?*NAUSEA AND VOMITING THAT IS NOT CONTROLLED WITH YOUR NAUSEA MEDICATION ?*UNUSUAL SHORTNESS OF BREATH ?*UNUSUAL BRUISING OR BLEEDING ?*URINARY PROBLEMS (pain or burning when urinating, or frequent urination) ?*BOWEL PROBLEMS (unusual diarrhea, constipation, pain near the anus) ?TENDERNESS IN MOUTH AND THROAT WITH OR WITHOUT PRESENCE OF ULCERS (sore throat, sores in mouth, or a toothache) ?UNUSUAL RASH, SWELLING OR PAIN  ?UNUSUAL VAGINAL DISCHARGE OR ITCHING  ? ?Items with * indicate a potential emergency and should be followed up as soon as possible or go to the Emergency Department if any problems should occur. ? ?Please show the CHEMOTHERAPY ALERT CARD or IMMUNOTHERAPY ALERT CARD at check-in to the  Emergency Department and triage nurse. ? ?Should you have questions after your visit or need to cancel or reschedule your appointment, please contact [facility]  Dept: [phone]  and follow the prompts.  Office hours are 8:00 a.m. to 4:30 p.m. Monday - Friday. Please note that voicemails left after 4:00 p.m. may not be returned until the following business day.  We are closed weekends and major holidays. You have access to a nurse at all times for urgent questions. Please call the main number to the clinic Dept: [phone] and follow the prompts. ? ? ?For any non-urgent questions, you may also contact your provider using MyChart. We now offer e-Visits for anyone 75 and older to request care online for non-urgent symptoms. For details visit [email or web address]. ?  ?Also download the MyChart app! Go to the app store, search "MyChart", open the app, select [facility], and log in with your MyChart username and password. ? ?Due to Covid, a mask is required upon entering the hospital/clinic. If you do not have a mask, one will be given to you upon arrival. For doctor visits, patients may have 1 support person aged 18 or older with them. For treatment visits, patients cannot have anyone with them due to current Covid guidelines and our immunocompromised population.  ? ?

## 2022-03-04 ENCOUNTER — Ambulatory Visit: Payer: BC Managed Care – PPO

## 2022-03-04 ENCOUNTER — Other Ambulatory Visit: Payer: Self-pay

## 2022-03-04 ENCOUNTER — Inpatient Hospital Stay: Payer: BC Managed Care – PPO

## 2022-03-04 VITALS — BP 123/83 | HR 98 | Temp 98.3°F | Resp 17

## 2022-03-04 DIAGNOSIS — C3491 Malignant neoplasm of unspecified part of right bronchus or lung: Secondary | ICD-10-CM

## 2022-03-04 DIAGNOSIS — C7931 Secondary malignant neoplasm of brain: Secondary | ICD-10-CM

## 2022-03-04 MED ORDER — SODIUM CHLORIDE 0.9% FLUSH
10.0000 mL | INTRAVENOUS | Status: DC | PRN
  Administered 2022-03-04: 10 mL

## 2022-03-04 MED ORDER — SODIUM CHLORIDE 0.9 % IV SOLN
10.0000 mg | Freq: Once | INTRAVENOUS | Status: AC
  Administered 2022-03-04: 10 mg via INTRAVENOUS
  Filled 2022-03-04: qty 10

## 2022-03-04 MED ORDER — HEPARIN SOD (PORK) LOCK FLUSH 100 UNIT/ML IV SOLN
500.0000 [IU] | Freq: Once | INTRAVENOUS | Status: AC | PRN
  Administered 2022-03-04: 500 [IU]

## 2022-03-04 MED ORDER — SODIUM CHLORIDE 0.9 % IV SOLN: Freq: Once | INTRAVENOUS | Status: AC

## 2022-03-04 MED ORDER — SODIUM CHLORIDE 0.9 % IV SOLN
100.0000 mg/m2 | Freq: Once | INTRAVENOUS | Status: AC
  Administered 2022-03-04: 210 mg via INTRAVENOUS
  Filled 2022-03-04: qty 10.5

## 2022-03-04 NOTE — Patient Instructions (Signed)
[name]  Discharge Instructions: ?Thank you for choosing [facility] to provide your oncology and hematology care.  ? ?If you have a lab appointment with the [facility], please go directly to the [facility] and check in at the registration area. ?  ?Wear comfortable clothing and clothing appropriate for easy access to any Portacath or PICC line.  ? ?We strive to give you quality time with your provider. You may need to reschedule your appointment if you arrive late (15 or more minutes).  Arriving late affects you and other patients whose appointments are after yours.  Also, if you miss three or more appointments without notifying the office, you may be dismissed from the clinic at the provider?s discretion.    ?  ?For prescription refill requests, have your pharmacy contact our office and allow 72 hours for refills to be completed.   ? ?Today you received the following chemotherapy and/or immunotherapy agents: Etoposide    ?  ?To help prevent nausea and vomiting after your treatment, we encourage you to take your nausea medication as directed. ? ?BELOW ARE SYMPTOMS THAT SHOULD BE REPORTED IMMEDIATELY: ?*FEVER GREATER THAN 100.4 F (38 ?C) OR HIGHER ?*CHILLS OR SWEATING ?*NAUSEA AND VOMITING THAT IS NOT CONTROLLED WITH YOUR NAUSEA MEDICATION ?*UNUSUAL SHORTNESS OF BREATH ?*UNUSUAL BRUISING OR BLEEDING ?*URINARY PROBLEMS (pain or burning when urinating, or frequent urination) ?*BOWEL PROBLEMS (unusual diarrhea, constipation, pain near the anus) ?TENDERNESS IN MOUTH AND THROAT WITH OR WITHOUT PRESENCE OF ULCERS (sore throat, sores in mouth, or a toothache) ?UNUSUAL RASH, SWELLING OR PAIN  ?UNUSUAL VAGINAL DISCHARGE OR ITCHING  ? ?Items with * indicate a potential emergency and should be followed up as soon as possible or go to the Emergency Department if any problems should occur. ? ?Please show the CHEMOTHERAPY ALERT CARD or IMMUNOTHERAPY ALERT CARD at check-in to  the Emergency Department and triage nurse. ? ?Should you have questions after your visit or need to cancel or reschedule your appointment, please contact [facility]  Dept: [phone]  and follow the prompts.  Office hours are 8:00 a.m. to 4:30 p.m. Monday - Friday. Please note that voicemails left after 4:00 p.m. may not be returned until the following business day.  We are closed weekends and major holidays. You have access to a nurse at all times for urgent questions. Please call the main number to the clinic Dept: [phone] and follow the prompts. ? ? ?For any non-urgent questions, you may also contact your provider using MyChart. We now offer e-Visits for anyone 7 and older to request care online for non-urgent symptoms. For details visit [email or web address]. ?  ?Also download the MyChart app! Go to the app store, search "MyChart", open the app, select [facility], and log in with your MyChart username and password. ? ?Due to Covid, a mask is required upon entering the hospital/clinic. If you do not have a mask, one will be given to you upon arrival. For doctor visits, patients may have 1 support person aged 68 or older with them. For treatment visits, patients cannot have anyone with them due to current Covid guidelines and our immunocompromised population.  ? ?

## 2022-03-06 ENCOUNTER — Other Ambulatory Visit: Payer: Self-pay

## 2022-03-06 ENCOUNTER — Inpatient Hospital Stay: Payer: BC Managed Care – PPO

## 2022-03-06 VITALS — BP 93/53 | HR 57 | Temp 98.0°F | Resp 18

## 2022-03-06 DIAGNOSIS — C3491 Malignant neoplasm of unspecified part of right bronchus or lung: Secondary | ICD-10-CM | POA: Diagnosis not present

## 2022-03-06 DIAGNOSIS — C7931 Secondary malignant neoplasm of brain: Secondary | ICD-10-CM

## 2022-03-06 MED ORDER — PEGFILGRASTIM-CBQV 6 MG/0.6ML ~~LOC~~ SOSY
6.0000 mg | PREFILLED_SYRINGE | Freq: Once | SUBCUTANEOUS | Status: AC
  Administered 2022-03-06: 6 mg via SUBCUTANEOUS
  Filled 2022-03-06: qty 0.6

## 2022-03-19 ENCOUNTER — Telehealth: Payer: Self-pay

## 2022-03-19 ENCOUNTER — Encounter (HOSPITAL_COMMUNITY): Payer: Self-pay

## 2022-03-19 ENCOUNTER — Ambulatory Visit (HOSPITAL_COMMUNITY)
Admission: RE | Admit: 2022-03-19 | Discharge: 2022-03-19 | Disposition: A | Discharge status: Home / Self Care | Payer: BC Managed Care – PPO | Source: Ambulatory Visit | Attending: Radiation Oncology | Admitting: Radiation Oncology

## 2022-03-19 DIAGNOSIS — C7931 Secondary malignant neoplasm of brain: Secondary | ICD-10-CM

## 2022-03-19 NOTE — Telephone Encounter (Signed)
Called [name] back. [facility] has left [facility] and unable to do MRI. Forwarded a message to Dr. Alfredo Bach nurse and Freeman Caldron, [place] that [facility] was unable to do MRI today and is requesting sedated MRI. ? ?Just FYI ?

## 2022-03-19 NOTE — Telephone Encounter (Signed)
[name] left a message. Ryan [name] is supposed to have a MRI today. He is refusing to do the MRI even after taking the Ativan. ?

## 2022-03-20 ENCOUNTER — Other Ambulatory Visit: Payer: Self-pay | Admitting: Radiation Therapy

## 2022-03-20 DIAGNOSIS — C7931 Secondary malignant neoplasm of brain: Secondary | ICD-10-CM

## 2022-03-20 MED FILL — Dexamethasone Sodium Phosphate Inj 100 MG/10ML: INTRAMUSCULAR | Qty: 1 | Status: AC

## 2022-03-20 MED FILL — Fosaprepitant Dimeglumine For IV Infusion 150 MG (Base Eq): INTRAVENOUS | Qty: 5 | Status: AC

## 2022-03-23 ENCOUNTER — Inpatient Hospital Stay: Payer: BC Managed Care – PPO

## 2022-03-23 ENCOUNTER — Inpatient Hospital Stay: Payer: BC Managed Care – PPO | Attending: Hematology and Oncology

## 2022-03-23 ENCOUNTER — Ambulatory Visit: Payer: BC Managed Care – PPO | Admitting: Hematology and Oncology

## 2022-03-23 ENCOUNTER — Other Ambulatory Visit: Payer: BC Managed Care – PPO

## 2022-03-23 ENCOUNTER — Other Ambulatory Visit: Payer: Self-pay

## 2022-03-23 ENCOUNTER — Inpatient Hospital Stay: Payer: BC Managed Care – PPO | Admitting: Hematology and Oncology

## 2022-03-23 ENCOUNTER — Encounter: Payer: Self-pay | Admitting: Hematology and Oncology

## 2022-03-23 VITALS — BP 108/74 | HR 105

## 2022-03-23 DIAGNOSIS — Z5189 Encounter for other specified aftercare: Secondary | ICD-10-CM | POA: Diagnosis not present

## 2022-03-23 DIAGNOSIS — Z5112 Encounter for antineoplastic immunotherapy: Secondary | ICD-10-CM | POA: Insufficient documentation

## 2022-03-23 DIAGNOSIS — R5381 Other malaise: Secondary | ICD-10-CM

## 2022-03-23 DIAGNOSIS — C3491 Malignant neoplasm of unspecified part of right bronchus or lung: Secondary | ICD-10-CM | POA: Diagnosis present

## 2022-03-23 DIAGNOSIS — Z79899 Other long term (current) drug therapy: Secondary | ICD-10-CM | POA: Insufficient documentation

## 2022-03-23 DIAGNOSIS — C7931 Secondary malignant neoplasm of brain: Secondary | ICD-10-CM

## 2022-03-23 DIAGNOSIS — R634 Abnormal weight loss: Secondary | ICD-10-CM

## 2022-03-23 DIAGNOSIS — D61818 Other pancytopenia: Secondary | ICD-10-CM | POA: Diagnosis not present

## 2022-03-23 LAB — CBC WITH DIFFERENTIAL/PLATELET
Abs Immature Granulocytes: 0.04 10*3/uL (ref 0.00–0.07)
Basophils Absolute: 0 10*3/uL (ref 0.0–0.1)
Basophils Relative: 0 %
Eosinophils Absolute: 0.1 10*3/uL (ref 0.0–0.5)
Eosinophils Relative: 1 %
HCT: 27.8 % — ABNORMAL LOW (ref 39.0–52.0)
Hemoglobin: 8.6 g/dL — ABNORMAL LOW (ref 13.0–17.0)
Immature Granulocytes: 0 %
Lymphocytes Relative: 7 %
Lymphs Abs: 0.7 10*3/uL (ref 0.7–4.0)
MCH: 32 pg (ref 26.0–34.0)
MCHC: 30.9 g/dL (ref 30.0–36.0)
MCV: 103.3 fL — ABNORMAL HIGH (ref 80.0–100.0)
Monocytes Absolute: 0.8 10*3/uL (ref 0.1–1.0)
Monocytes Relative: 8 %
Neutro Abs: 8.5 10*3/uL — ABNORMAL HIGH (ref 1.7–7.7)
Neutrophils Relative %: 84 %
Platelets: 335 10*3/uL (ref 150–400)
RBC: 2.69 MIL/uL — ABNORMAL LOW (ref 4.22–5.81)
RDW: 17.4 % — ABNORMAL HIGH (ref 11.5–15.5)
WBC: 10 10*3/uL (ref 4.0–10.5)
nRBC: 0 % (ref 0.0–0.2)

## 2022-03-23 LAB — SAMPLE TO BLOOD BANK

## 2022-03-23 LAB — COMPREHENSIVE METABOLIC PANEL
ALT: 11 U/L (ref 0–44)
AST: 10 U/L — ABNORMAL LOW (ref 15–41)
Albumin: 3.7 g/dL (ref 3.5–5.0)
Alkaline Phosphatase: 73 U/L (ref 38–126)
Anion gap: 10 (ref 5–15)
BUN: 17 mg/dL (ref 8–23)
CO2: 28 mmol/L (ref 22–32)
Calcium: 8.9 mg/dL (ref 8.9–10.3)
Chloride: 102 mmol/L (ref 98–111)
Creatinine, Ser: 0.7 mg/dL (ref 0.61–1.24)
GFR, Estimated: >60 mL/min (ref >60)
Glucose, Bld: 133 mg/dL — ABNORMAL HIGH (ref 70–99)
Potassium: 3.4 mmol/L — ABNORMAL LOW (ref 3.5–5.1)
Sodium: 140 mmol/L (ref 135–145)
Total Bilirubin: 0.7 mg/dL (ref 0.3–1.2)
Total Protein: 6.6 g/dL (ref 6.5–8.1)

## 2022-03-23 MED ORDER — SODIUM CHLORIDE 0.9% FLUSH
10.0000 mL | INTRAVENOUS | Status: DC | PRN
  Administered 2022-03-23: 10 mL

## 2022-03-23 MED ORDER — SODIUM CHLORIDE 0.9 % IV SOLN: Freq: Once | INTRAVENOUS | Status: AC

## 2022-03-23 MED ORDER — SODIUM CHLORIDE 0.9% FLUSH
10.0000 mL | Freq: Once | INTRAVENOUS | Status: AC
  Administered 2022-03-23: 10 mL

## 2022-03-23 MED ORDER — SODIUM CHLORIDE 0.9 % IV SOLN
1200.0000 mg | Freq: Once | INTRAVENOUS | Status: AC
  Administered 2022-03-23: 1200 mg via INTRAVENOUS
  Filled 2022-03-23: qty 20

## 2022-03-23 MED ORDER — HEPARIN SOD (PORK) LOCK FLUSH 100 UNIT/ML IV SOLN
500.0000 [IU] | Freq: Once | INTRAVENOUS | Status: AC | PRN
  Administered 2022-03-23: 500 [IU]

## 2022-03-23 MED FILL — Dexamethasone Sodium Phosphate Inj 100 MG/10ML: INTRAMUSCULAR | Qty: 1 | Status: AC

## 2022-03-23 NOTE — Patient Instructions (Signed)
[facility]  Discharge Instructions: ?Thank you for choosing [facility] to provide your oncology and hematology care.  ? ?If you have a lab appointment with the [facility], please go directly to the [facility] and check in at the registration area. ?  ?Wear comfortable clothing and clothing appropriate for easy access to any Portacath or PICC line.  ? ?We strive to give you quality time with your provider. You may need to reschedule your appointment if you arrive late (15 or more minutes).  Arriving late affects you and other patients whose appointments are after yours.  Also, if you miss three or more appointments without notifying the office, you may be dismissed from the clinic at the provider?s discretion.    ?  ?For prescription refill requests, have your pharmacy contact our office and allow 72 hours for refills to be completed.   ? ?Today you received the following chemotherapy and/or immunotherapy agents tecentriq    ?  ?To help prevent nausea and vomiting after your treatment, we encourage you to take your nausea medication as directed. ? ?BELOW ARE SYMPTOMS THAT SHOULD BE REPORTED IMMEDIATELY: ?*FEVER GREATER THAN 100.4 F (38 ?C) OR HIGHER ?*CHILLS OR SWEATING ?*NAUSEA AND VOMITING THAT IS NOT CONTROLLED WITH YOUR NAUSEA MEDICATION ?*UNUSUAL SHORTNESS OF BREATH ?*UNUSUAL BRUISING OR BLEEDING ?*URINARY PROBLEMS (pain or burning when urinating, or frequent urination) ?*BOWEL PROBLEMS (unusual diarrhea, constipation, pain near the anus) ?TENDERNESS IN MOUTH AND THROAT WITH OR WITHOUT PRESENCE OF ULCERS (sore throat, sores in mouth, or a toothache) ?UNUSUAL RASH, SWELLING OR PAIN  ?UNUSUAL VAGINAL DISCHARGE OR ITCHING  ? ?Items with * indicate a potential emergency and should be followed up as soon as possible or go to the Emergency Department if any problems should occur. ? ?Please show the CHEMOTHERAPY ALERT CARD or IMMUNOTHERAPY ALERT CARD at check-in to  the Emergency Department and triage nurse. ? ?Should you have questions after your visit or need to cancel or reschedule your appointment, please contact [facility]  Dept: [phone]  and follow the prompts.  Office hours are 8:00 a.m. to 4:30 p.m. Monday - Friday. Please note that voicemails left after 4:00 p.m. may not be returned until the following business day.  We are closed weekends and major holidays. You have access to a nurse at all times for urgent questions. Please call the main number to the clinic Dept: [phone] and follow the prompts. ? ? ?For any non-urgent questions, you may also contact your provider using MyChart. We now offer e-Visits for anyone 46 and older to request care online for non-urgent symptoms. For details visit [email or web address]. ?  ?Also download the MyChart app! Go to the app store, search "MyChart", open the app, select [facility], and log in with your MyChart username and password. ? ?Due to Covid, a mask is required upon entering the hospital/clinic. If you do not have a mask, one will be given to you upon arrival. For doctor visits, patients may have 1 support person aged 74 or older with them. For treatment visits, patients cannot have anyone with them due to current Covid guidelines and our immunocompromised population.  ? ?

## 2022-03-23 NOTE — Progress Notes (Signed)
Patient here today and is only getting his tecentriq due to anemia. His heart rate was 102-107, so Dr. Alvy Bimler was contacted- she stated it was ok to proceed with treatment. ?

## 2022-03-24 ENCOUNTER — Ambulatory Visit: Payer: BC Managed Care – PPO

## 2022-03-24 ENCOUNTER — Encounter: Payer: Self-pay | Admitting: Urology

## 2022-03-24 ENCOUNTER — Inpatient Hospital Stay: Payer: BC Managed Care – PPO

## 2022-03-24 ENCOUNTER — Encounter: Payer: BC Managed Care – PPO | Admitting: Nutrition

## 2022-03-24 ENCOUNTER — Encounter: Payer: Self-pay | Admitting: Hematology and Oncology

## 2022-03-24 DIAGNOSIS — D61818 Other pancytopenia: Secondary | ICD-10-CM | POA: Insufficient documentation

## 2022-03-24 NOTE — Assessment & Plan Note (Signed)
He has no new neurological deficits ?He will continue follow-up as planned by radiation oncologist with monitoring ?

## 2022-03-24 NOTE — Assessment & Plan Note (Signed)
He has recurrent weight loss ?We will dietitian to see him ?

## 2022-03-24 NOTE — Progress Notes (Signed)
Telephone follow-up. I verified patient identity, and began nursing interview. Patient doing well. No issues reported at this time. ? ?Meaningful use complete. ? ?Patient reminded of his 8:30am-03/25/22 telephone appointment w/ [name]. I left my extension [phone] in case patient needs anything. Patient verbalized understanding of information. ? ?Patient contact [phone] ?

## 2022-03-24 NOTE — Assessment & Plan Note (Signed)
He is frail and weak ?He has lost weight ?I reviewed plan of care with him and his caregiver ?Due to significant pancytopenia, and his recent weight loss, I do not feel that he can tolerate chemotherapy today ?Rather than canceling his treatment, I recommend we proceed with immunotherapy only ?We will reschedule his chemotherapy to be given in 3 weeks time to allow bone marrow recovery ?He is in agreement with the plan of care ?

## 2022-03-24 NOTE — Progress Notes (Signed)
[facility] ?OFFICE PROGRESS NOTE ? ?Patient Care Team: ?Ryan Lark, MD as PCP - General (Hematology and Oncology) ?Valrie Hart, RN as Oncology Nurse Navigator (Oncology) ? ?ASSESSMENT & PLAN:  ?Metastatic primary lung cancer, right ([place]) ?He is frail and weak ?He has lost weight ?I reviewed plan of care with him and his caregiver ?Due to significant pancytopenia, and his recent weight loss, I do not feel that he can tolerate chemotherapy today ?Rather than canceling his treatment, I recommend we proceed with immunotherapy only ?We will reschedule his chemotherapy to be given in 3 weeks time to allow bone marrow recovery ?He is in agreement with the plan of care ? ?Metastatic cancer to brain [facility]) ?He has no new neurological deficits ?He will continue follow-up as planned by radiation oncologist with monitoring ? ?Pancytopenia, acquired ([facility]) ?He has profound pancytopenia due to treatment ?He is somewhat symptomatic ?As above, I plan to defer chemotherapy but will proceed with immunotherapy only ? ?Physical debility ?He is quite debilitated with physical weakness ?The patient also have memory issues ?I reviewed plan of care with him and caregiver ?I am hopeful with additional time of break from chemo, that would allow recovery of his weakness ? ?Weight loss ?He has recurrent weight loss ?We will dietitian to see him ? ?No orders of the defined types were placed in this encounter. ? ? ?All questions were answered. The patient knows to call the clinic with any problems, questions or concerns. ?The total time spent in the appointment was 40 minutes encounter with patients including review of chart and various tests results, discussions about plan of care and coordination of care plan ?  ?Ryan Lark, MD ?03/24/2022 12:20 PM ? ?INTERVAL HISTORY: ?Please see below for problem oriented charting. ?he returns for treatment follow-up seen prior to cycle 6 of treatment ?His caregiver is not around ?I called  her over the phone to review plan of care ?He has lost some weight ?He feels tired and weak ?No new neurological deficits ?Denies nausea or changes in bowel habits ?No recent bleeding or infection ? ?REVIEW OF SYSTEMS:   ?Constitutional: Denies fevers, chills ?Eyes: Denies blurriness of vision ?Ears, nose, mouth, throat, and face: Denies mucositis or sore throat ?Respiratory: Denies cough, dyspnea or wheezes ?Cardiovascular: Denies palpitation, chest discomfort or lower extremity swelling ?Gastrointestinal:  Denies nausea, heartburn or change in bowel habits ?Skin: Denies abnormal skin rashes ?Lymphatics: Denies new lymphadenopathy or easy bruising ?Behavioral/Psych: Mood is stable, no new changes  ?All other systems were reviewed with the patient and are negative. ? ?I have reviewed the past medical history, past surgical history, social history and family history with the patient and they are unchanged from previous note. ? ?ALLERGIES:  is allergic to hydralazine and percocet [oxycodone-acetaminophen]. ? ?MEDICATIONS:  ?Current Outpatient Medications  ?Medication Sig Dispense Refill  ? atorvastatin (LIPITOR) 10 MG tablet Take 1 tablet (10 mg total) by mouth daily. 30 tablet 3  ? lidocaine-prilocaine (EMLA) cream Apply 1 application topically as needed. 30 g 3  ? LORazepam (ATIVAN) 1 MG tablet Take 1 tablet (1 mg total) by mouth as needed for anxiety (take one tablet 30 minutes prior to MRI scans and may repeat just before the scan if needed.). 40 tablet 0  ? memantine (NAMENDA) 10 MG tablet Take 1 tablet (10 mg total) by mouth 2 (two) times daily. 60 tablet 4  ? mirtazapine (REMERON) 30 MG tablet Take 1 tablet (30 mg total) by mouth at bedtime.  90 tablet 1  ? ondansetron (ZOFRAN) 8 MG tablet Take 1 tablet (8 mg total) by mouth every 8 (eight) hours as needed for nausea or vomiting. 30 tablet 1  ? pantoprazole (PROTONIX) 40 MG tablet Take 1 tablet (40 mg total) by mouth daily. 30 tablet 3  ? ?No current  facility-administered medications for this visit.  ? ? ?SUMMARY OF ONCOLOGIC HISTORY: ?Oncology History  ?Metastatic cancer to brain [facility])  ?11/27/2021 Initial Diagnosis  ? Metastatic cancer to brain [facility]) ?  ?12/03/2021 -  Chemotherapy  ? Patient is on Treatment Plan : LUNG SMALL CELL Carboplatin D1 / Etoposide D1-3 q21d  ?   ?Metastatic primary lung cancer, right ([place])  ?11/26/2021 Imaging  ? There is 2.9 cm lobulated noncalcified pleural-based nodule in the right upper lobe suggesting possible primary malignant neoplasm. There are pathologically enlarged bulky lymph nodes in the mediastinum and both hilar regions, more so on the right side suggesting metastatic lymphadenopathy. There is extrinsic pressure over the right main pulmonary artery and superior vena cava by the enlarged lymph nodes in mediastinum. ?  ?Increased interstitial markings and small scattered nodules are seen in the right lung which may be part of pneumonia or pulmonary metastatic disease. ?  ?There are nodular densities in both adrenals largest measuring 3 cm in the left adrenal suggesting possible metastatic disease. There are enlarged lymph nodes in the retroperitoneum and mesentery largest measuring 5.9 cm in maximum diameter suggesting metastatic lymphadenopathy. ?  ?There are multiple cysts in the liver. There are other indeterminate low-density lesions of varying sizes in the liver. Possibility of hepatic metastatic disease is not excluded. ?  ?Head of the pancreas is enlarged in size with lobulated margins. Possibility of neoplastic process in the head of the pancreas is not excluded. ?Follow-up PET-CT and biopsy as warranted should be considered. ?  ?Coronary artery calcifications are seen. Other findings as described in the body of the report. ?  ?11/26/2021 - 12/05/2021 Hospital Admission  ? He was admitted to the hospital and found to have metastatic lung cancer ?  ?11/27/2021 Initial Diagnosis  ? Metastatic primary lung cancer, right  [facility]) ?  ?11/27/2021 Imaging  ? MRI HEAD IMPRESSION: ?  ?1. 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 Mild localized edema with hemorrhagic blood products about several of these lesions ?without significant regional mass effect or midline shift.  ?2. 3.2 x 6.6 x 4.8 cm enhancing extra-axial mass at the posterior aspect of the superior sagittal sinus, indeterminate. While this finding could reflect an additional dural-based metastasis, a ?possible concomitant meningioma could also be considered. ?  ?MRV HEAD IMPRESSION: ?  ?1. Invasion and obliteration of the posterior superior sagittal sinus by the above described dural based mass. Small amount of additional nonocclusive thrombus within the superior sagittal sinus ?anterior to the mass as above. ?2. Remainder of the major dural sinuses are otherwise patent. ?  ?  ?11/28/2021 Pathology Results  ? A. 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  ?Please see comment.  ? ?The following immunostains are performed on block A2 with appropriate  ?controls:  ?CK7: Negative.  ?TTF-1: Positive.  ?Chromogranin: Negative.  ?Synoptophysin: Negative.  ?CD56: Positive.  ?Ki-67: Very high proliferative index (more than 90%).  ? ?Comment: Morphologic features of the neoplasm are most consistent with metastasis from a primary small cell carcinoma in the lung.  ?  ?12/03/2021 -  Chemotherapy  ?  Patient is on Treatment Plan : LUNG SMALL CELL Carboplatin D1 / Etoposide D1-3 q21d  ?   ?12/03/2021 -  Chemotherapy  ? Patient is on Treatment Plan :  ?LUNG SMALL CELL Carboplatin D1 / Etoposide D1-3 q21d   ?  ?12/09/2021 Cancer Staging  ? Staging form: Lung, AJCC 8th Edition ?- Clinical: Stage IV (cT4, cN3, pM1) - Signed by Ryan Lark, MD on 12/09/2021 ? ?  ?01/14/2022 PET scan  ? 1. Since the CTs of 11/26/2021, response to therapy of widespread  metastatic small-cell carcinoma as detailed above. Near complete resolution right upper lobe primary with significant improvement in thoracic nodal, adrenal, abdominal nodal, and peritoneal metastasis. ?2. No new or p

## 2022-03-24 NOTE — Assessment & Plan Note (Signed)
He has profound pancytopenia due to treatment ?He is somewhat symptomatic ?As above, I plan to defer chemotherapy but will proceed with immunotherapy only ?

## 2022-03-24 NOTE — Assessment & Plan Note (Signed)
He is quite debilitated with physical weakness ?The patient also have memory issues ?I reviewed plan of care with him and caregiver ?I am hopeful with additional time of break from chemo, that would allow recovery of his weakness ?

## 2022-03-25 ENCOUNTER — Ambulatory Visit
Admission: RE | Admit: 2022-03-25 | Discharge: 2022-03-25 | Disposition: A | Discharge status: Home / Self Care | Payer: BC Managed Care – PPO | Source: Ambulatory Visit | Attending: Urology | Admitting: Urology

## 2022-03-25 ENCOUNTER — Encounter: Payer: Self-pay | Admitting: Hematology and Oncology

## 2022-03-25 ENCOUNTER — Inpatient Hospital Stay: Payer: BC Managed Care – PPO

## 2022-03-25 ENCOUNTER — Telehealth: Payer: Self-pay

## 2022-03-25 NOTE — Telephone Encounter (Signed)
H & P follow-up appointment reminder. I spoke to patient, verified identity and reminded patient of his 8:30am-03/26/22 in-person appointment w/ [name]. I advised patient to arrive 76min early for check-in. I left my extension [phone] in case patient needs anything. Patient verbalized understanding of information. ?

## 2022-03-26 ENCOUNTER — Encounter: Payer: Self-pay | Admitting: Urology

## 2022-03-26 ENCOUNTER — Other Ambulatory Visit: Payer: Self-pay

## 2022-03-26 ENCOUNTER — Ambulatory Visit
Admission: RE | Admit: 2022-03-26 | Discharge: 2022-03-26 | Disposition: A | Discharge status: Home / Self Care | Payer: BC Managed Care – PPO | Source: Ambulatory Visit | Attending: Urology | Admitting: Urology

## 2022-03-26 VITALS — BP 95/69 | HR 98 | Temp 97.0°F | Resp 18 | Ht 67.0 in | Wt 174.4 lb

## 2022-03-26 DIAGNOSIS — C7931 Secondary malignant neoplasm of brain: Secondary | ICD-10-CM

## 2022-03-26 NOTE — Progress Notes (Signed)
Patient presents today for a brief H&P to be completed prior to scheduling MRI brain with sedation.  Required form was completed and a copy will be scanned in his medical record.  He will be scheduled for his MRI at [facility] with sedation and I will plan to follow-up with him by phone to review the results and recommendations from multidisciplinary brain conference. ? ?Nicholos Johns, MMS, PA-C ?[facility] ?Radiation Oncology Physician Assistant ?Direct Dial: [phone]  Fax: [phone] ? ?

## 2022-03-26 NOTE — Progress Notes (Signed)
H & P for sedation appointment. I verified patient identity and began nursing interview. Patient is doing well overall. No issues reported at this time. ? ?Meaningful use complete. ? ?BP 95/69 (BP Location: Right Arm, Patient Position: Sitting, Cuff Size: Normal)   Pulse 98   Temp (!) 97 ?F (36.1 ?C) (Temporal)   Resp 18   Ht 5\' 7"  (1.702 m)   Wt 174 lb 6.4 oz (79.1 kg)   SpO2 100%   BMI 27.31 kg/m?  ? ?

## 2022-03-27 ENCOUNTER — Other Ambulatory Visit: Payer: Self-pay | Admitting: Radiation Therapy

## 2022-03-27 ENCOUNTER — Ambulatory Visit: Payer: BC Managed Care – PPO

## 2022-04-13 ENCOUNTER — Encounter: Payer: Self-pay | Admitting: Hematology and Oncology

## 2022-04-13 ENCOUNTER — Inpatient Hospital Stay: Payer: BC Managed Care – PPO

## 2022-04-13 ENCOUNTER — Other Ambulatory Visit: Payer: Self-pay

## 2022-04-13 ENCOUNTER — Inpatient Hospital Stay: Payer: BC Managed Care – PPO | Admitting: Nutrition

## 2022-04-13 ENCOUNTER — Other Ambulatory Visit: Payer: Self-pay | Admitting: Hematology and Oncology

## 2022-04-13 ENCOUNTER — Inpatient Hospital Stay: Payer: BC Managed Care – PPO | Admitting: Hematology and Oncology

## 2022-04-13 DIAGNOSIS — C7931 Secondary malignant neoplasm of brain: Secondary | ICD-10-CM

## 2022-04-13 DIAGNOSIS — D61818 Other pancytopenia: Secondary | ICD-10-CM | POA: Diagnosis not present

## 2022-04-13 DIAGNOSIS — C3490 Malignant neoplasm of unspecified part of unspecified bronchus or lung: Secondary | ICD-10-CM

## 2022-04-13 DIAGNOSIS — R634 Abnormal weight loss: Secondary | ICD-10-CM

## 2022-04-13 DIAGNOSIS — C3491 Malignant neoplasm of unspecified part of right bronchus or lung: Secondary | ICD-10-CM

## 2022-04-13 DIAGNOSIS — R5381 Other malaise: Secondary | ICD-10-CM | POA: Diagnosis not present

## 2022-04-13 LAB — COMPREHENSIVE METABOLIC PANEL
ALT: 9 U/L (ref 0–44)
AST: 12 U/L — ABNORMAL LOW (ref 15–41)
Albumin: 3.4 g/dL — ABNORMAL LOW (ref 3.5–5.0)
Alkaline Phosphatase: 60 U/L (ref 38–126)
Anion gap: 6 (ref 5–15)
BUN: 14 mg/dL (ref 8–23)
CO2: 28 mmol/L (ref 22–32)
Calcium: 8.8 mg/dL — ABNORMAL LOW (ref 8.9–10.3)
Chloride: 106 mmol/L (ref 98–111)
Creatinine, Ser: 0.73 mg/dL (ref 0.61–1.24)
GFR, Estimated: >60 mL/min (ref >60)
Glucose, Bld: 113 mg/dL — ABNORMAL HIGH (ref 70–99)
Potassium: 3.7 mmol/L (ref 3.5–5.1)
Sodium: 140 mmol/L (ref 135–145)
Total Bilirubin: 0.6 mg/dL (ref 0.3–1.2)
Total Protein: 5.9 g/dL — ABNORMAL LOW (ref 6.5–8.1)

## 2022-04-13 LAB — CBC WITH DIFFERENTIAL/PLATELET
Abs Immature Granulocytes: 0.01 10*3/uL (ref 0.00–0.07)
Basophils Absolute: 0.1 10*3/uL (ref 0.0–0.1)
Basophils Relative: 1 %
Eosinophils Absolute: 0.4 10*3/uL (ref 0.0–0.5)
Eosinophils Relative: 6 %
HCT: 33.1 % — ABNORMAL LOW (ref 39.0–52.0)
Hemoglobin: 10.3 g/dL — ABNORMAL LOW (ref 13.0–17.0)
Immature Granulocytes: 0 %
Lymphocytes Relative: 9 %
Lymphs Abs: 0.6 10*3/uL — ABNORMAL LOW (ref 0.7–4.0)
MCH: 32 pg (ref 26.0–34.0)
MCHC: 31.1 g/dL (ref 30.0–36.0)
MCV: 102.8 fL — ABNORMAL HIGH (ref 80.0–100.0)
Monocytes Absolute: 0.6 10*3/uL (ref 0.1–1.0)
Monocytes Relative: 8 %
Neutro Abs: 5.3 10*3/uL (ref 1.7–7.7)
Neutrophils Relative %: 76 %
Platelets: 209 10*3/uL (ref 150–400)
RBC: 3.22 MIL/uL — ABNORMAL LOW (ref 4.22–5.81)
RDW: 14.9 % (ref 11.5–15.5)
WBC: 7 10*3/uL (ref 4.0–10.5)
nRBC: 0 % (ref 0.0–0.2)

## 2022-04-13 LAB — SAMPLE TO BLOOD BANK

## 2022-04-13 MED ORDER — SODIUM CHLORIDE 0.9 % IV SOLN
10.0000 mg | Freq: Once | INTRAVENOUS | Status: AC
  Administered 2022-04-13: 10 mg via INTRAVENOUS
  Filled 2022-04-13: qty 10

## 2022-04-13 MED ORDER — SODIUM CHLORIDE 0.9 % IV SOLN
100.0000 mg/m2 | Freq: Once | INTRAVENOUS | Status: AC
  Administered 2022-04-13: 200 mg via INTRAVENOUS
  Filled 2022-04-13: qty 10

## 2022-04-13 MED ORDER — SODIUM CHLORIDE 0.9 % IV SOLN
150.0000 mg | Freq: Once | INTRAVENOUS | Status: AC
  Administered 2022-04-13: 150 mg via INTRAVENOUS
  Filled 2022-04-13: qty 150

## 2022-04-13 MED ORDER — HEPARIN SOD (PORK) LOCK FLUSH 100 UNIT/ML IV SOLN
500.0000 [IU] | Freq: Once | INTRAVENOUS | Status: AC | PRN
  Administered 2022-04-13: 500 [IU]

## 2022-04-13 MED ORDER — PALONOSETRON HCL INJECTION 0.25 MG/5ML
0.2500 mg | Freq: Once | INTRAVENOUS | Status: AC
  Administered 2022-04-13: 0.25 mg via INTRAVENOUS
  Filled 2022-04-13: qty 5

## 2022-04-13 MED ORDER — SODIUM CHLORIDE 0.9 % IV SOLN
1200.0000 mg | Freq: Once | INTRAVENOUS | Status: AC
  Administered 2022-04-13: 1200 mg via INTRAVENOUS
  Filled 2022-04-13: qty 20

## 2022-04-13 MED ORDER — SODIUM CHLORIDE 0.9 % IV SOLN
534.5000 mg | Freq: Once | INTRAVENOUS | Status: AC
  Administered 2022-04-13: 530 mg via INTRAVENOUS
  Filled 2022-04-13: qty 53

## 2022-04-13 MED ORDER — MIRTAZAPINE 30 MG PO TABS: 30.0000 mg | ORAL_TABLET | Freq: Every day | ORAL | 1 refills | Status: DC

## 2022-04-13 MED ORDER — SODIUM CHLORIDE 0.9 % IV SOLN: Freq: Once | INTRAVENOUS | Status: AC

## 2022-04-13 MED ORDER — SODIUM CHLORIDE 0.9% FLUSH
10.0000 mL | Freq: Once | INTRAVENOUS | Status: AC
  Administered 2022-04-13: 10 mL

## 2022-04-13 MED ORDER — SODIUM CHLORIDE 0.9% FLUSH
10.0000 mL | INTRAVENOUS | Status: DC | PRN
  Administered 2022-04-13: 10 mL

## 2022-04-13 NOTE — Assessment & Plan Note (Signed)
He has no neurological deficit ?He is scheduled for MRI as directed by neuro oncologist and radiation oncologist ?

## 2022-04-13 NOTE — Assessment & Plan Note (Signed)
Overall, he has improved clinically after he took a small break ?His pancytopenia is improved and clinically, he started to eat better and has gained weight ?We will proceed with final treatment today ?I plan to order repeat pet imaging in 1 month ?Assuming he had excellent response then, we will proceed with maintenance phase with atezolizumab ? ?

## 2022-04-13 NOTE — Assessment & Plan Note (Signed)
Overall, he is getting stronger ?He will continue therapy in the outpatient setting ?

## 2022-04-13 NOTE — Progress Notes (Signed)
[facility] ?OFFICE PROGRESS NOTE ? ?Patient Care Team: ?[name], MD as PCP - General (Hematology and Oncology) ?Valrie Hart, RN as Oncology Nurse Navigator (Oncology) ? ?ASSESSMENT & PLAN:  ?Metastatic primary lung cancer, right ([place]) ?Overall, he has improved clinically after he took a small break ?His pancytopenia is improved and clinically, he started to eat better and has gained weight ?We will proceed with final treatment today ?I plan to order repeat pet imaging in 1 month ?Assuming he had excellent response then, we will proceed with maintenance phase with atezolizumab ? ? 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 ?Pancytopenia, acquired ([place]) ?This is improved ?Monitor closely ? ?Physical debility ?Overall, he is getting stronger ?He will continue therapy in the outpatient setting ? ?Weight loss ?He has not lost weight since last time I saw him ?He will continue close monitoring and follow-up ?He has appointment to follow-up with dietitian ? ?No orders of the defined types were placed in this encounter. ? ? ?All questions were answered. The patient knows to call the clinic with any problems, questions or concerns. ?The total time spent in the appointment was 30 minutes encounter with patients including review of chart and various tests results, discussions about plan of care and coordination of care plan ?  ?[name], MD ?04/13/2022 8:50 AM ? ?INTERVAL HISTORY: ?Please see below for problem oriented charting. ?he returns for treatment follow-up seen prior to final treatment this week ?He felt better since we delay his treatment ?He is able to eat and gain weight ?His energy level has improved ?No recent signs of bleeding ?He has mild chronic constipation ? ?REVIEW OF SYSTEMS:   ?Constitutional: Denies fevers, chills or abnormal weight loss ?Eyes: Denies blurriness of  vision ?Ears, nose, mouth, throat, and face: Denies mucositis or sore throat ?Respiratory: Denies cough, dyspnea or wheezes ?Cardiovascular: Denies palpitation, chest discomfort or lower extremity swelling ?Skin: Denies abnormal skin rashes ?Lymphatics: Denies new lymphadenopathy or easy bruising ?Neurological:Denies numbness, tingling or new weaknesses ?Behavioral/Psych: Mood is stable, no new changes  ?All other systems were reviewed with the patient and are negative. ? ?I have reviewed the past medical history, past surgical history, social history and family history with the patient and they are unchanged from previous note. ? ?ALLERGIES:  is allergic to hydralazine and percocet [oxycodone-acetaminophen]. ? ?MEDICATIONS:  ?Current Outpatient Medications  ?Medication Sig Dispense Refill  ? atorvastatin (LIPITOR) 10 MG tablet Take 1 tablet (10 mg total) by mouth daily. 30 tablet 3  ? lidocaine-prilocaine (EMLA) cream Apply 1 application topically as needed. 30 g 3  ? LORazepam (ATIVAN) 1 MG tablet Take 1 tablet (1 mg total) by mouth as needed for anxiety (take one tablet 30 minutes prior to MRI scans and may repeat just before the scan if needed.). 40 tablet 0  ? memantine (NAMENDA) 10 MG tablet Take 1 tablet (10 mg total) by mouth 2 (two) times daily. 60 tablet 4  ? mirtazapine (REMERON) 30 MG tablet Take 1 tablet (30 mg total) by mouth at bedtime. 90 tablet 1  ? ondansetron (ZOFRAN) 8 MG tablet Take 1 tablet (8 mg total) by mouth every 8 (eight) hours as needed for nausea or vomiting. 30 tablet 1  ? pantoprazole (PROTONIX) 40 MG tablet Take 1 tablet (40 mg total) by mouth daily. 30 tablet 3  ? ?No current facility-administered medications for this visit.  ? ?  Facility-Administered Medications Ordered in Other Visits  ?Medication Dose Route Frequency Provider Last Rate Last Admin  ? atezolizumab (TECENTRIQ) 1,200 mg in sodium chloride 0.9 % 250 mL chemo infusion  1,200 mg Intravenous Once Alvy Bimler, Idalie Canto, MD      ?  CARBOplatin (PARAPLATIN) 530 mg in sodium chloride 0.9 % 250 mL chemo infusion  530 mg Intravenous Once Alvy Bimler, Stirling Orton, MD      ? dexamethasone (DECADRON) 10 mg in sodium chloride 0.9 % 50 mL IVPB  10 mg Intravenous Once Alvy Bimler, Chapin Arduini, MD      ? etoposide (VEPESID) 200 mg in sodium chloride 0.9 % 500 mL chemo infusion  100 mg/m2 (Treatment Plan Recorded) Intravenous Once Alvy Bimler, Chiquetta Langner, MD      ? fosaprepitant (EMEND) 150 mg in sodium chloride 0.9 % 145 mL IVPB  150 mg Intravenous Once Alvy Bimler, Delmos Velaquez, MD      ? heparin lock flush 100 unit/mL  500 Units Intracatheter Once PRN Alvy Bimler, Elsworth Ledin, MD      ? palonosetron (ALOXI) injection 0.25 mg  0.25 mg Intravenous Once Alvy Bimler, Meline Russaw, MD      ? sodium chloride flush (NS) 0.9 % injection 10 mL  10 mL Intracatheter PRN [name], MD      ? ? ?SUMMARY OF ONCOLOGIC HISTORY: ?Oncology History  ?Metastatic cancer to brain [facility])  ?11/27/2021 Initial Diagnosis  ? Metastatic cancer to brain [facility]) ? ?  ?12/03/2021 -  Chemotherapy  ? Patient is on Treatment Plan : LUNG SMALL CELL Carboplatin D1 / Etoposide D1-3 q21d  ? ?  ?  ?Metastatic primary lung cancer, right ([place])  ?11/26/2021 Imaging  ? There is 2.9 cm lobulated noncalcified pleural-based nodule in the right upper lobe suggesting possible primary malignant neoplasm. There are pathologically enlarged bulky lymph nodes in the mediastinum and both hilar regions, more so on the right side suggesting metastatic lymphadenopathy. There is extrinsic pressure over the right main pulmonary artery and superior vena cava by the enlarged lymph nodes in mediastinum. ?  ?Increased interstitial markings and small scattered nodules are seen in the right lung which may be part of pneumonia or pulmonary metastatic disease. ?  ?There are nodular densities in both adrenals largest measuring 3 cm in the left adrenal suggesting possible metastatic disease. There are enlarged lymph nodes in the retroperitoneum and mesentery largest measuring 5.9 cm in maximum  diameter suggesting metastatic lymphadenopathy. ?  ?There are multiple cysts in the liver. There are other indeterminate low-density lesions of varying sizes in the liver. Possibility of hepatic metastatic disease is not excluded. ?  ?Head of the pancreas is enlarged in size with lobulated margins. Possibility of neoplastic process in the head of the pancreas is not excluded. ?Follow-up PET-CT and biopsy as warranted should be considered. ?  ?Coronary artery calcifications are seen. Other findings as described in the body of the report. ?  ?11/26/2021 - 12/05/2021 Hospital Admission  ? He was admitted to the hospital and found to have metastatic lung cancer ?  ?11/27/2021 Initial Diagnosis  ? Metastatic primary lung cancer, right ([place]) ? ?  ?11/27/2021 Imaging  ? MRI HEAD IMPRESSION: ?  ?1. 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 Mild localized edema with hemorrhagic blood products about several of these lesions ?without significant regional mass effect or midline shift.  ?2. 3.2 x 6.6 x 4.8 cm enhancing extra-axial mass at the posterior aspect of the superior sagittal sinus, indeterminate. While this finding could reflect an additional  dural-based metastasis, a ?possible concomitant meningioma could also be considered. ?  ?MRV HEAD IMPRESSION: ?  ?1. Invasion and obliteration of the posterior superior sagittal sinus by the above described dural based mass. Small amount of additional nonocclusive thrombus within the superior sagittal sinus ?anterior to the mass as above. ?2. Remainder of the major dural sinuses are otherwise patent. ?  ?  ?11/28/2021 Pathology Results  ? A. 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  ?Please see comment.  ? ?The following immunostains are performed on block A2 with appropriate  ?controls:  ?CK7: Negative.  ?TTF-1: Positive.   ?Chromogranin: Negative.  ?Synoptophysin: Negative.  ?CD56: Positive.  ?Ki-67: Very high proliferative index (more than 90%).  ? ?Comment: Morphologic features of the neoplasm are most consistent with metastasis from a primary smal

## 2022-04-13 NOTE — Progress Notes (Signed)
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 ? ?Weight has stabilized and was documented as 178.2 pounds April 24 from 178.2 pounds March 13. ?Patient reports his appetite has improved. ?He is eating better and attributes this to improve taste. ?Verbalizes dislike of family reminding him to eat more.  States he has asked them to stop. ?Verbalizes appreciation for Ensure supplements in the past.  He currently is not using very many because he feels he is eating better. ? ?Nutrition diagnosis: Food and nutrition related knowledge deficit improved. ? ?Intervention: ?Provided support and encouragement. ?Encourage patient to continue to try to consume small high-calorie, high-protein foods for weight maintenance. ?Asked patient to contact RD if he would like to receive additional supplements. ? ?Monitoring, evaluation, goals: ?Patient will tolerate continued increased calories and protein for weight maintenance. ? ?Next visit: Patient prefers to contact RD for future needs. ? ?**Disclaimer: This note was dictated with voice recognition software. Similar sounding words can inadvertently be transcribed and this note may contain transcription errors which may not have been corrected upon publication of note.** ? ?

## 2022-04-13 NOTE — Patient Instructions (Signed)
[facility]  Discharge Instructions: ?Thank you for choosing [facility] to provide your oncology and hematology care.  ? ?If you have a lab appointment with the [facility], please go directly to the [facility] and check in at the registration area. ?  ?Wear comfortable clothing and clothing appropriate for easy access to any Portacath or PICC line.  ? ?We strive to give you quality time with your provider. You may need to reschedule your appointment if you arrive late (15 or more minutes).  Arriving late affects you and other patients whose appointments are after yours.  Also, if you miss three or more appointments without notifying the office, you may be dismissed from the clinic at the provider?s discretion.    ?  ?For prescription refill requests, have your pharmacy contact our office and allow 72 hours for refills to be completed.   ? ?Today you received the following chemotherapy and/or immunotherapy agents: Carboplatin, Tecentriq, Etoposide.     ?  ?To help prevent nausea and vomiting after your treatment, we encourage you to take your nausea medication as directed. ? ?BELOW ARE SYMPTOMS THAT SHOULD BE REPORTED IMMEDIATELY: ?*FEVER GREATER THAN 100.4 F (38 ?C) OR HIGHER ?*CHILLS OR SWEATING ?*NAUSEA AND VOMITING THAT IS NOT CONTROLLED WITH YOUR NAUSEA MEDICATION ?*UNUSUAL SHORTNESS OF BREATH ?*UNUSUAL BRUISING OR BLEEDING ?*URINARY PROBLEMS (pain or burning when urinating, or frequent urination) ?*BOWEL PROBLEMS (unusual diarrhea, constipation, pain near the anus) ?TENDERNESS IN MOUTH AND THROAT WITH OR WITHOUT PRESENCE OF ULCERS (sore throat, sores in mouth, or a toothache) ?UNUSUAL RASH, SWELLING OR PAIN  ?UNUSUAL VAGINAL DISCHARGE OR ITCHING  ? ?Items with * indicate a potential emergency and should be followed up as soon as possible or go to the Emergency Department if any problems should occur. ? ?Please show the CHEMOTHERAPY ALERT CARD or IMMUNOTHERAPY  ALERT CARD at check-in to the Emergency Department and triage nurse. ? ?Should you have questions after your visit or need to cancel or reschedule your appointment, please contact [facility]  Dept: [phone]  and follow the prompts.  Office hours are 8:00 a.m. to 4:30 p.m. Monday - Friday. Please note that voicemails left after 4:00 p.m. may not be returned until the following business day.  We are closed weekends and major holidays. You have access to a nurse at all times for urgent questions. Please call the main number to the clinic Dept: [phone] and follow the prompts. ? ? ?For any non-urgent questions, you may also contact your provider using MyChart. We now offer e-Visits for anyone 26 and older to request care online for non-urgent symptoms. For details visit [email or web address]. ?  ?Also download the MyChart app! Go to the app store, search "MyChart", open the app, select [facility], and log in with your MyChart username and password. ? ?Due to Covid, a mask is required upon entering the hospital/clinic. If you do not have a mask, one will be given to you upon arrival. For doctor visits, patients may have 1 support person aged 76 or older with them. For treatment visits, patients cannot have anyone with them due to current Covid guidelines and our immunocompromised population.  ? ?

## 2022-04-13 NOTE — Assessment & Plan Note (Signed)
This is improved ?Monitor closely ?

## 2022-04-13 NOTE — Assessment & Plan Note (Signed)
He has not lost weight since last time I saw him ?He will continue close monitoring and follow-up ?He has appointment to follow-up with dietitian ?

## 2022-04-14 ENCOUNTER — Inpatient Hospital Stay: Payer: BC Managed Care – PPO

## 2022-04-14 VITALS — BP 126/76 | HR 100 | Temp 98.0°F | Resp 17

## 2022-04-14 DIAGNOSIS — C3491 Malignant neoplasm of unspecified part of right bronchus or lung: Secondary | ICD-10-CM | POA: Diagnosis not present

## 2022-04-14 DIAGNOSIS — C7931 Secondary malignant neoplasm of brain: Secondary | ICD-10-CM

## 2022-04-14 MED ORDER — SODIUM CHLORIDE 0.9 % IV SOLN: Freq: Once | INTRAVENOUS | Status: AC

## 2022-04-14 MED ORDER — SODIUM CHLORIDE 0.9 % IV SOLN
10.0000 mg | Freq: Once | INTRAVENOUS | Status: AC
  Administered 2022-04-14: 10 mg via INTRAVENOUS
  Filled 2022-04-14: qty 10

## 2022-04-14 MED ORDER — HEPARIN SOD (PORK) LOCK FLUSH 100 UNIT/ML IV SOLN
500.0000 [IU] | Freq: Once | INTRAVENOUS | Status: AC | PRN
  Administered 2022-04-14: 500 [IU]

## 2022-04-14 MED ORDER — SODIUM CHLORIDE 0.9% FLUSH
10.0000 mL | INTRAVENOUS | Status: DC | PRN
  Administered 2022-04-14: 10 mL

## 2022-04-14 MED ORDER — SODIUM CHLORIDE 0.9 % IV SOLN
100.0000 mg/m2 | Freq: Once | INTRAVENOUS | Status: AC
  Administered 2022-04-14: 200 mg via INTRAVENOUS
  Filled 2022-04-14: qty 10

## 2022-04-15 ENCOUNTER — Other Ambulatory Visit: Payer: Self-pay

## 2022-04-15 ENCOUNTER — Inpatient Hospital Stay: Payer: BC Managed Care – PPO

## 2022-04-15 VITALS — BP 125/71 | HR 106 | Temp 98.1°F | Resp 18

## 2022-04-15 DIAGNOSIS — C3491 Malignant neoplasm of unspecified part of right bronchus or lung: Secondary | ICD-10-CM | POA: Diagnosis not present

## 2022-04-15 DIAGNOSIS — C7931 Secondary malignant neoplasm of brain: Secondary | ICD-10-CM

## 2022-04-15 MED ORDER — SODIUM CHLORIDE 0.9 % IV SOLN
100.0000 mg/m2 | Freq: Once | INTRAVENOUS | Status: AC
  Administered 2022-04-15: 200 mg via INTRAVENOUS
  Filled 2022-04-15: qty 10

## 2022-04-15 MED ORDER — HEPARIN SOD (PORK) LOCK FLUSH 100 UNIT/ML IV SOLN
500.0000 [IU] | Freq: Once | INTRAVENOUS | Status: AC | PRN
  Administered 2022-04-15: 500 [IU]

## 2022-04-15 MED ORDER — SODIUM CHLORIDE 0.9 % IV SOLN
10.0000 mg | Freq: Once | INTRAVENOUS | Status: AC
  Administered 2022-04-15: 10 mg via INTRAVENOUS
  Filled 2022-04-15: qty 10

## 2022-04-15 MED ORDER — SODIUM CHLORIDE 0.9 % IV SOLN: Freq: Once | INTRAVENOUS | Status: AC

## 2022-04-15 MED ORDER — SODIUM CHLORIDE 0.9% FLUSH
10.0000 mL | INTRAVENOUS | Status: DC | PRN
  Administered 2022-04-15: 10 mL

## 2022-04-15 NOTE — Patient Instructions (Signed)
[facility]  Discharge Instructions: Thank you for choosing [facility] to provide your oncology and hematology care.   If you have a lab appointment with the Cancer Center, please go directly to the Cancer Center and check in at the registration area.   Wear comfortable clothing and clothing appropriate for easy access to any Portacath or PICC line.   We strive to give you quality time with your provider. You may need to reschedule your appointment if you arrive late (15 or more minutes).  Arriving late affects you and other patients whose appointments are after yours.  Also, if you miss three or more appointments without notifying the office, you may be dismissed from the clinic at the provider's discretion.      For prescription refill requests, have your pharmacy contact our office and allow 72 hours for refills to be completed.    Today you received the following chemotherapy and/or immunotherapy agents: etoposide.     To help prevent nausea and vomiting after your treatment, we encourage you to take your nausea medication as directed.  BELOW ARE SYMPTOMS THAT SHOULD BE REPORTED IMMEDIATELY: . *FEVER GREATER THAN 100.4 F (38 C) OR HIGHER . *CHILLS OR SWEATING . *NAUSEA AND VOMITING THAT IS NOT CONTROLLED WITH YOUR NAUSEA MEDICATION . *UNUSUAL SHORTNESS OF BREATH . *UNUSUAL BRUISING OR BLEEDING . *URINARY PROBLEMS (pain or burning when urinating, or frequent urination) . *BOWEL PROBLEMS (unusual diarrhea, constipation, pain near the anus) . TENDERNESS IN MOUTH AND THROAT WITH OR WITHOUT PRESENCE OF ULCERS (sore throat, sores in mouth, or a toothache) . UNUSUAL RASH, SWELLING OR PAIN  . UNUSUAL VAGINAL DISCHARGE OR ITCHING   Items with * indicate a potential emergency and should be followed up as soon as possible or go to the Emergency Department if any problems should occur.  Please show the CHEMOTHERAPY ALERT CARD or IMMUNOTHERAPY ALERT  CARD at check-in to the Emergency Department and triage nurse.  Should you have questions after your visit or need to cancel or reschedule your appointment, please contact [facility]  Dept: [phone]  and follow the prompts.  Office hours are 8:00 a.m. to 4:30 p.m. Monday - Friday. Please note that voicemails left after 4:00 p.m. may not be returned until the following business day.  We are closed weekends and major holidays. You have access to a nurse at all times for urgent questions. Please call the main number to the clinic Dept: [phone] and follow the prompts.   For any non-urgent questions, you may also contact your provider using MyChart. We now offer e-Visits for anyone 18 and older to request care online for non-urgent symptoms. For details visit [email or web address].   Also download the MyChart app! Go to the app store, search "MyChart", open the app, select [facility], and log in with your MyChart username and password.  Due to Covid, a mask is required upon entering the hospital/clinic. If you do not have a mask, one will be given to you upon arrival. For doctor visits, patients may have 1 support person aged 18 or older with them. For treatment visits, patients cannot have anyone with them due to current Covid guidelines and our immunocompromised population.   

## 2022-04-17 ENCOUNTER — Other Ambulatory Visit: Payer: Self-pay

## 2022-04-17 ENCOUNTER — Inpatient Hospital Stay: Payer: BC Managed Care – PPO

## 2022-04-17 DIAGNOSIS — C3491 Malignant neoplasm of unspecified part of right bronchus or lung: Secondary | ICD-10-CM | POA: Diagnosis not present

## 2022-04-17 DIAGNOSIS — C7931 Secondary malignant neoplasm of brain: Secondary | ICD-10-CM

## 2022-04-17 MED ORDER — PEGFILGRASTIM-CBQV 6 MG/0.6ML ~~LOC~~ SOSY
6.0000 mg | PREFILLED_SYRINGE | Freq: Once | SUBCUTANEOUS | Status: AC
  Administered 2022-04-17: 6 mg via SUBCUTANEOUS
  Filled 2022-04-17: qty 0.6

## 2022-04-17 NOTE — Patient Instructions (Signed)

## 2022-04-20 ENCOUNTER — Encounter (HOSPITAL_COMMUNITY): Payer: Self-pay | Admitting: *Deleted

## 2022-04-20 NOTE — Progress Notes (Signed)
Cardiologist - n/a ?PCP/Oncology - Dr Alvy Bimler ? ?Chest x-ray - 01/27/22 (1V) ?EKG - 01/27/22 ?Stress Test - n/a ?ECHO - n/a ?Cardiac Cath - n/a ? ?ICD Pacemaker/Loop - n/a ? ?Sleep Study -  n/a ?CPAP - none ? ?STOP now taking any Aspirin (unless otherwise instructed by your surgeon), Aleve, Naproxen, Ibuprofen, Motrin, Advil, Goody's, BC's, all herbal medications, fish oil, and all vitamins.  ? ?Coronavirus Screening ?Do you have any of the following symptoms:  ?Cough yes/no: No ?Fever (>100.65F)  yes/no: No ?Runny nose yes/no: No ?Sore throat yes/no: No ?Difficulty breathing/shortness of breath  yes/no: No ? ?Have you traveled in the last 14 days and where? yes/no: No ? ?Patient verbalized understanding of instructions that were given via phone. ?

## 2022-04-20 NOTE — Anesthesia Preprocedure Evaluation (Addendum)
Anesthesia Evaluation  ?Patient identified by MRN, date of birth, ID band ?Patient awake ? ? ? ?Reviewed: ?Allergy & Precautions, NPO status , Patient's Chart, lab work & pertinent test results ? ?History of Anesthesia Complications ?Negative for: history of anesthetic complications ? ?Airway ?Mallampati: II ? ?TM Distance: >3 FB ?Neck ROM: Full ? ? ? Dental ? ?(+) Missing,  ?  ?Pulmonary ?former smoker,  ?  ?Pulmonary exam normal ? ? ? ? ? ? ? Cardiovascular ?negative cardio ROS ?Normal cardiovascular exam ? ? ?  ?Neuro/Psych ?Anxiety Metastatic lung ca to brain ?  ? GI/Hepatic ?negative GI ROS, Neg liver ROS,   ?Endo/Other  ?diabetes, Well Controlled, Type 2 ? Renal/GU ?negative Renal ROS  ?negative genitourinary ?  ?Musculoskeletal ?negative musculoskeletal ROS ?(+)  ? Abdominal ?  ?Peds ? Hematology ? ?(+) Blood dyscrasia (Hgb 10.3), anemia ,   ?Anesthesia Other Findings ?Day of surgery medications reviewed with patient. ? Reproductive/Obstetrics ?negative OB ROS ? ?  ? ? ? ? ? ? ? ? ? ? ? ? ? ?  ?  ? ? ? ? ? ? ? ?Anesthesia Physical ?Anesthesia Plan ? ?ASA: 3 ? ?Anesthesia Plan: General  ? ?Post-op Pain Management: Minimal or no pain anticipated  ? ?Induction: Intravenous ? ?PONV Risk Score and Plan: 2 and Treatment may vary due to age or medical condition, Ondansetron, Dexamethasone and Midazolam ? ?Airway Management Planned: Oral ETT ? ?Additional Equipment: None ? ?Intra-op Plan:  ? ?Post-operative Plan: Extubation in OR ? ?Informed Consent: I have reviewed the patients History and Physical, chart, labs and discussed the procedure including the risks, benefits and alternatives for the proposed anesthesia with the patient or authorized representative who has indicated his/her understanding and acceptance.  ? ? ? ?Dental advisory given ? ?Plan Discussed with: CRNA ? ?Anesthesia Plan Comments:   ? ? ? ? ? ?Anesthesia Quick Evaluation ? ?

## 2022-04-21 ENCOUNTER — Encounter (HOSPITAL_COMMUNITY)
Admission: RE | Disposition: A | Discharge status: Home / Self Care | Payer: Self-pay | Source: Home / Self Care | Attending: Radiation Oncology

## 2022-04-21 ENCOUNTER — Encounter (HOSPITAL_COMMUNITY): Payer: Self-pay | Admitting: Radiation Oncology

## 2022-04-21 ENCOUNTER — Ambulatory Visit (HOSPITAL_COMMUNITY): Payer: BC Managed Care – PPO | Admitting: Certified Registered Nurse Anesthetist

## 2022-04-21 ENCOUNTER — Ambulatory Visit (HOSPITAL_COMMUNITY)
Admission: RE | Admit: 2022-04-21 | Discharge: 2022-04-21 | Disposition: A | Discharge status: Home / Self Care | Payer: BC Managed Care – PPO | Source: Ambulatory Visit | Attending: Radiation Oncology | Admitting: Radiation Oncology

## 2022-04-21 ENCOUNTER — Other Ambulatory Visit: Payer: Self-pay

## 2022-04-21 ENCOUNTER — Ambulatory Visit (HOSPITAL_COMMUNITY)
Admission: RE | Admit: 2022-04-21 | Discharge: 2022-04-21 | Disposition: A | Discharge status: Home / Self Care | Payer: BC Managed Care – PPO | Attending: Radiation Oncology | Admitting: Radiation Oncology

## 2022-04-21 DIAGNOSIS — C7931 Secondary malignant neoplasm of brain: Secondary | ICD-10-CM | POA: Insufficient documentation

## 2022-04-21 DIAGNOSIS — C3490 Malignant neoplasm of unspecified part of unspecified bronchus or lung: Secondary | ICD-10-CM | POA: Insufficient documentation

## 2022-04-21 DIAGNOSIS — C7949 Secondary malignant neoplasm of other parts of nervous system: Secondary | ICD-10-CM | POA: Diagnosis not present

## 2022-04-21 HISTORY — PX: RADIOLOGY WITH ANESTHESIA: SHX6223

## 2022-04-21 LAB — GLUCOSE, CAPILLARY
Glucose-Capillary: 91 mg/dL (ref 70–99)
Glucose-Capillary: 93 mg/dL (ref 70–99)
Glucose-Capillary: 83 mg/dL (ref 70–99)

## 2022-04-21 IMAGING — MR MR HEAD WO/W CM
10 of 14 series · 21 of 48 positions shown · IV contrast (cc gad)
Comparison: [DATE]

CLINICAL DATA: Brain metastases, assess treatment response [REDACTED]
Protocol. Lung cancer, follow-up

EXAM:
MRI HEAD WITHOUT AND WITH CONTRAST
TECHNIQUE: Multiplanar, multiecho pulse sequences of the brain and surrounding
structures were obtained without and with intravenous contrast.
CONTRAST:  7.5mL GADAVIST GADOBUTROL 1 MMOL/ML IV SOLN

[Series 2: FLAIR · sagittal · 3.0mm · 0.47mm/px · 1 of 40 slices shown (1 of 2)]
[im 1/40]
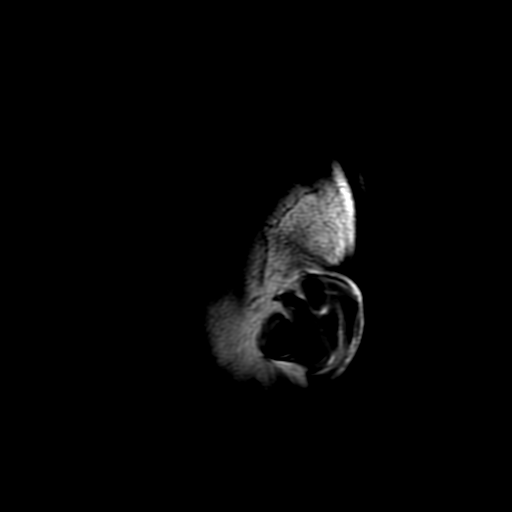

[Series 3: DWI · axial · 3.0mm · 0.94mm/px · z∈[-25,+124]mm · 3 of 104 slices shown]
[im 1/104]
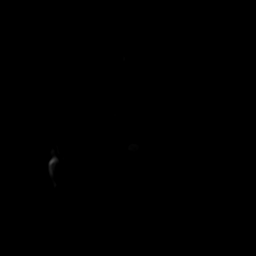
[im 52/104]
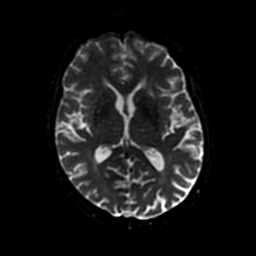
[im 104/104]
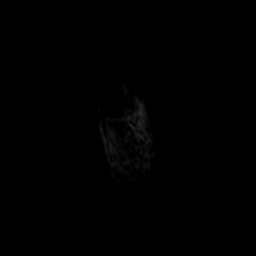

[Series 5: FLAIR · axial · 3.0mm · 0.47mm/px · 1 of 55 slices shown (2 of 2)]
[im 1/55]
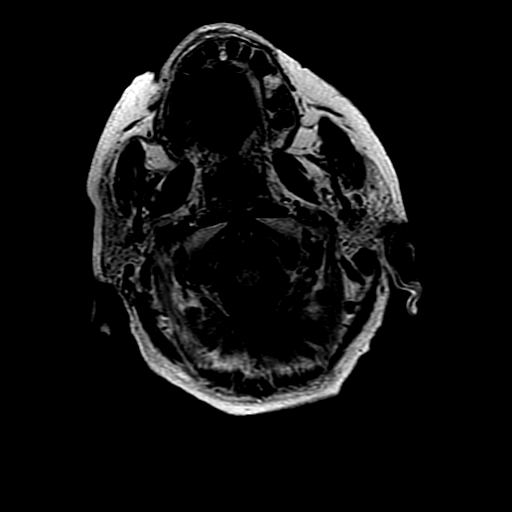

[Series 6: SWI · axial · 3.0mm · 0.47mm/px · z∈[-43,+111]mm · 3 of 104 slices shown]
[im 1/104]
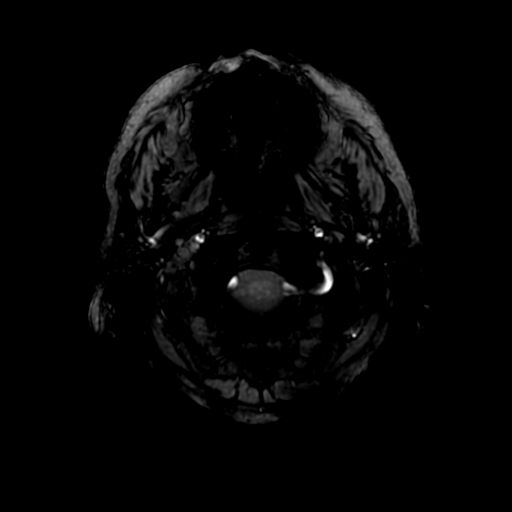
[im 52/104]
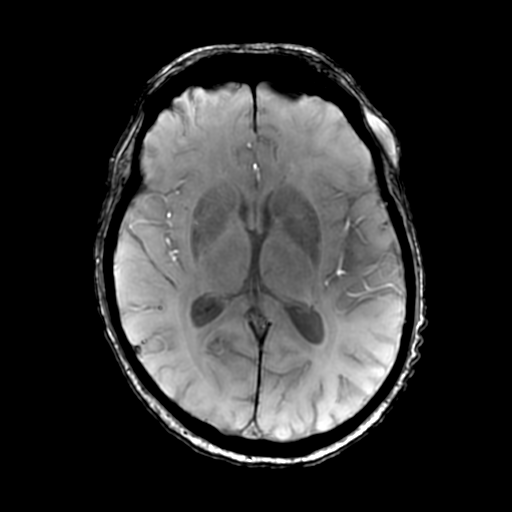
[im 104/104]
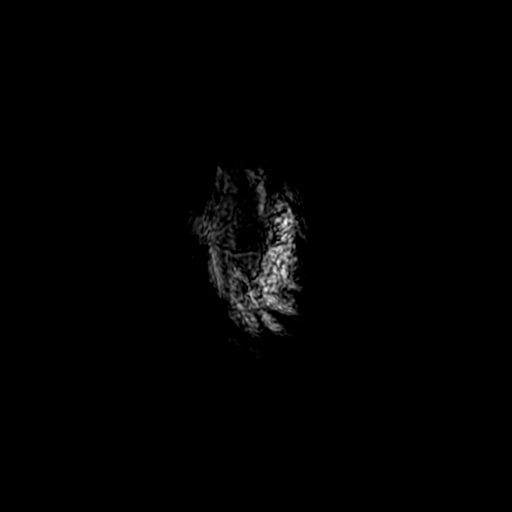

[Series 8: T2 post-contrast · coronal · 3.0mm · 0.39mm/px · 1 of 45 slices shown (1 of 2)]
[im 1/45]
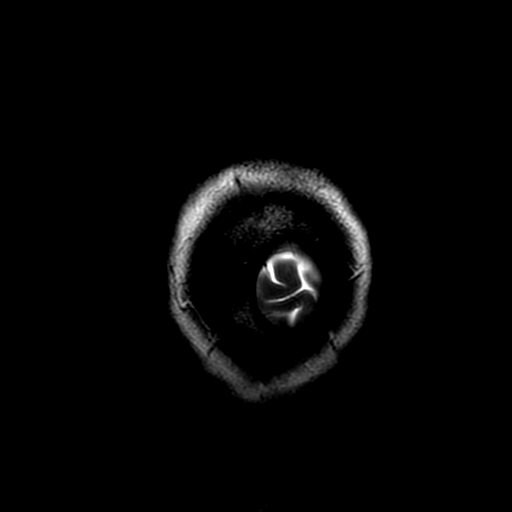

[Series 9: T2 post-contrast · axial · 5.0mm · 0.47mm/px · 1 of 27 slices shown (2 of 2)]
[im 1/27]
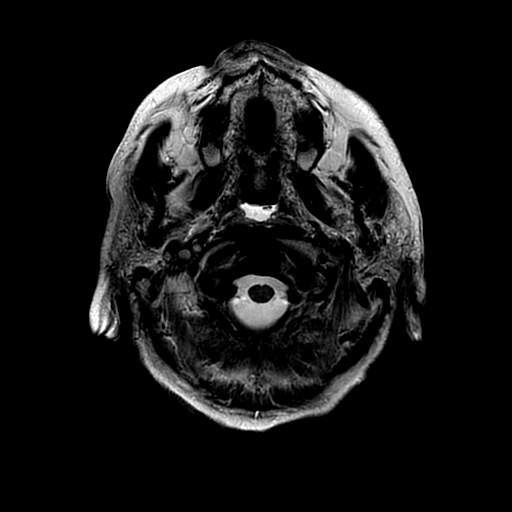

[Series 10: T1 post-contrast · coronal · 3.0mm · 0.43mm/px · 1 of 45 slices shown (1 of 2)]
[im 1/45]
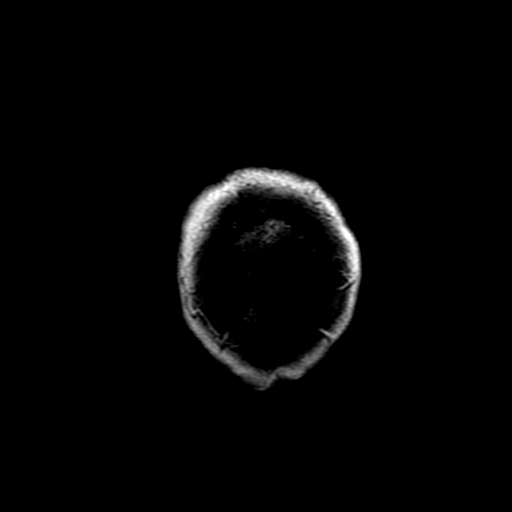

[Series 11: FLAIR post-contrast · sagittal · 3.0mm · 0.47mm/px · 1 of 40 slices shown]
[im 1/40]
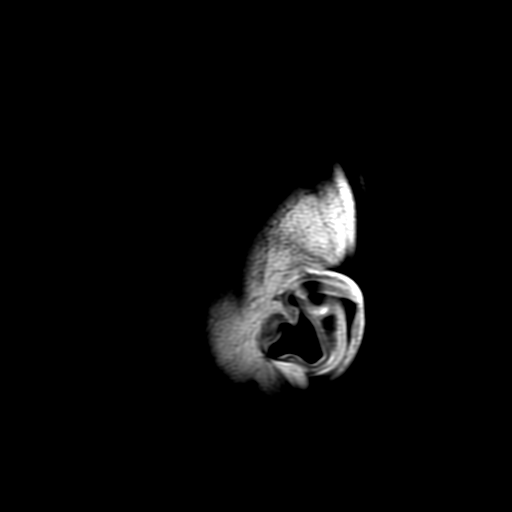

[Series 350: ADC · axial · 3.0mm · 0.94mm/px · 1 of 52 slices shown]
[im 1/52]
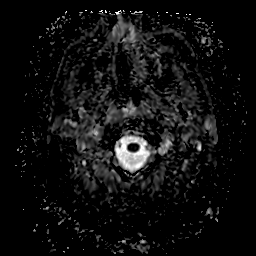

[Series 1200: T1 post-contrast · axial · 0.9mm · 0.50mm/px · z∈[-118,+135]mm · 8 of 301 slices shown (2 of 2)]
[im 1/301]
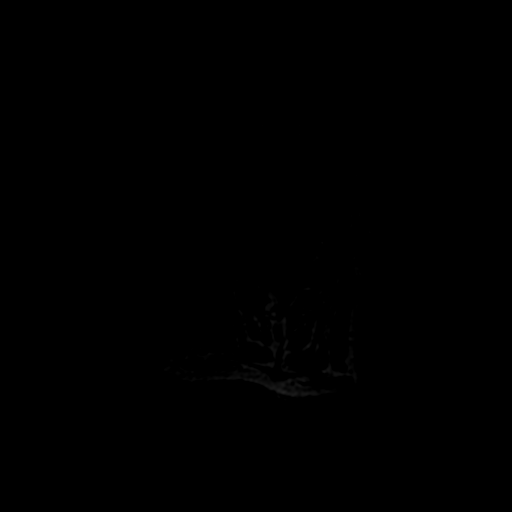
[im 43/301]
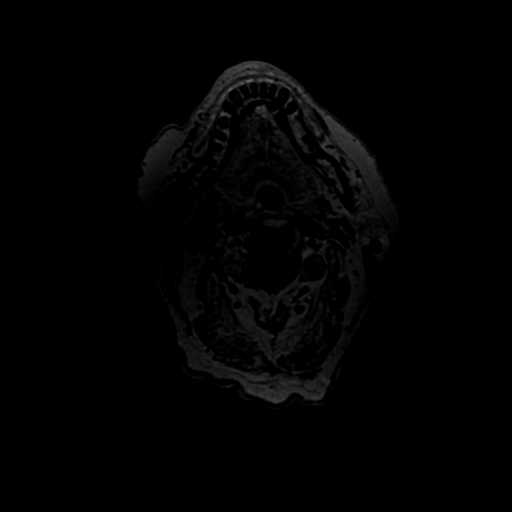
[im 86/301]
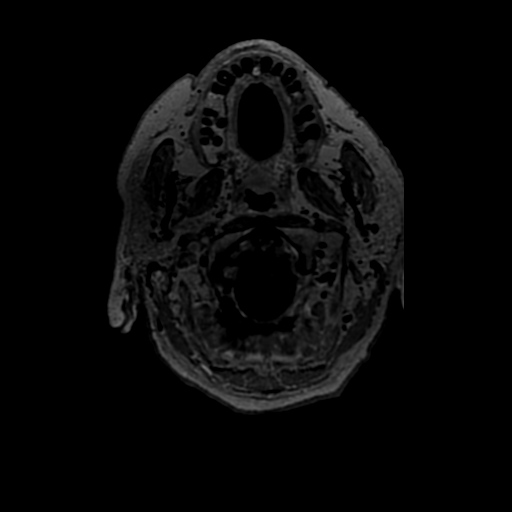
[im 129/301]
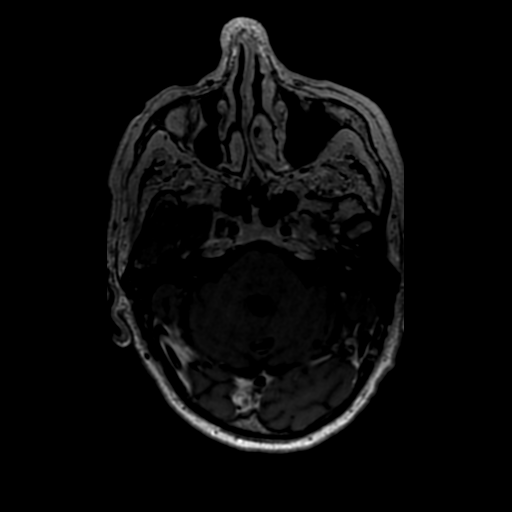
[im 172/301]
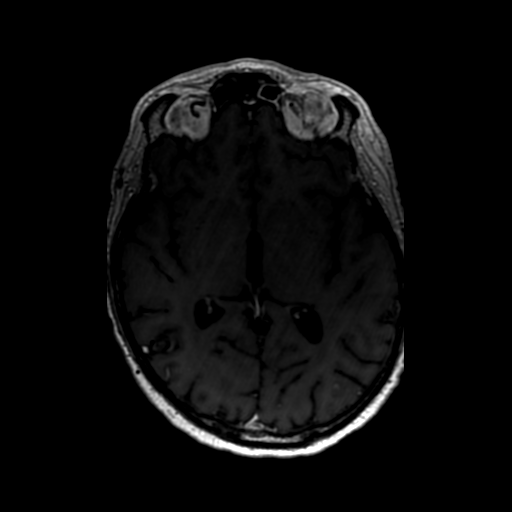
[im 215/301]
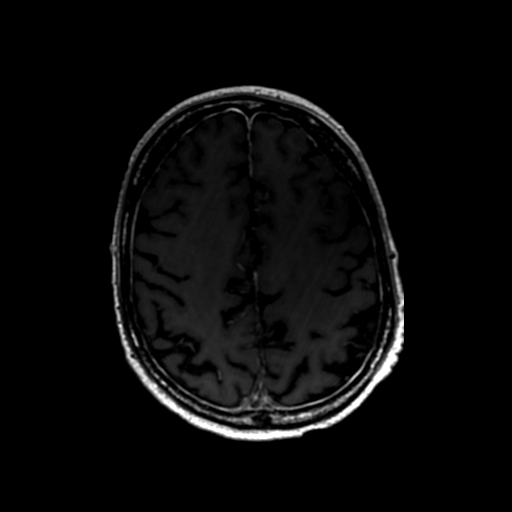
[im 258/301]
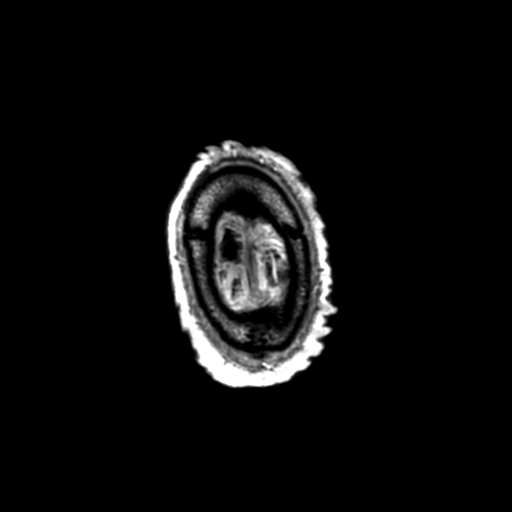
[im 301/301]
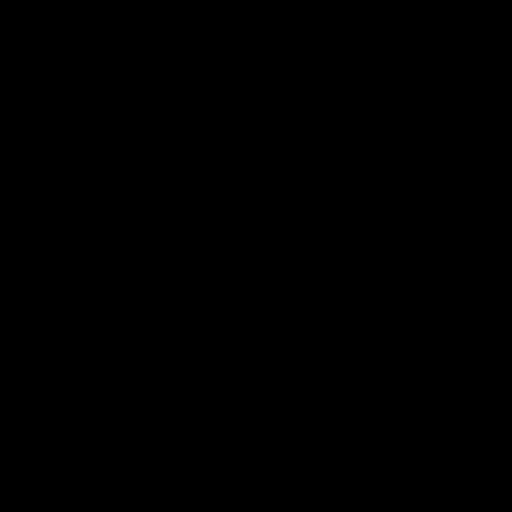

[21 of 48 positions shown; findings below may reference images not displayed]

FINDINGS: Brain: Significant improvement in intracranial lesions. Comparison
with the prior study is limited by differences in slice thickness.
There remains some irregular enhancement in the lateral right
cerebellar hemisphere measuring about 1.3 cm (series [XO], image
104). Supratentorially, the largest remaining lesion is in the right
middle frontal gyrus measuring 6 mm (series [XO], image 241). No
definite new lesion. For example, a 4 mm lesion of the right frontal
lobe (image 226) is most likely present on the prior study on image
39 of series 26.

The extra-axial mass along the superior sagittal sinus has
substantially regressed. There remains occlusion of the superior
sagittal sinus.

Increased patchy T2 hyperintensity in the supratentorial white
matter is probably related to treatment.

No acute infarction ventricles are similar in size. Remains dural
thickening and enhancement along the posterior cerebral convexities.

Vascular: Major vessel flow voids at the skull base are preserved.

Skull and upper cervical spine: Marrow signal is within normal
limits.

Sinuses/Orbits: Minor mucosal thickening.  Orbits are unremarkable.

Other: Sella is unremarkable.  Mastoid air cells are clear.
IMPRESSION: Significant regression of previous metastatic disease. No definite
new disease.

## 2022-04-21 SURGERY — MRI WITH ANESTHESIA
Anesthesia: General

## 2022-04-21 MED ORDER — FENTANYL CITRATE (PF) 100 MCG/2ML IJ SOLN
INTRAMUSCULAR | Status: DC | PRN
  Administered 2022-04-21: 100 ug via INTRAVENOUS

## 2022-04-21 MED ORDER — ONDANSETRON HCL 4 MG/2ML IJ SOLN
INTRAMUSCULAR | Status: DC | PRN
  Administered 2022-04-21: 4 mg via INTRAVENOUS

## 2022-04-21 MED ORDER — PHENYLEPHRINE 80 MCG/ML (10ML) SYRINGE FOR IV PUSH (FOR BLOOD PRESSURE SUPPORT)
PREFILLED_SYRINGE | INTRAVENOUS | Status: DC | PRN
  Administered 2022-04-21 (×10): 80 ug via INTRAVENOUS

## 2022-04-21 MED ORDER — ROCURONIUM BROMIDE 10 MG/ML (PF) SYRINGE
PREFILLED_SYRINGE | INTRAVENOUS | Status: DC | PRN
  Administered 2022-04-21: 60 mg via INTRAVENOUS
  Administered 2022-04-21: 20 mg via INTRAVENOUS

## 2022-04-21 MED ORDER — LACTATED RINGERS IV SOLN: INTRAVENOUS | Status: DC

## 2022-04-21 MED ORDER — GADOBUTROL 1 MMOL/ML IV SOLN
7.5000 mL | Freq: Once | INTRAVENOUS | Status: AC | PRN
  Administered 2022-04-21: 7.5 mL via INTRAVENOUS

## 2022-04-21 MED ORDER — PROPOFOL 10 MG/ML IV BOLUS
INTRAVENOUS | Status: DC | PRN
  Administered 2022-04-21: 30 mg via INTRAVENOUS
  Administered 2022-04-21: 150 mg via INTRAVENOUS
  Administered 2022-04-21: 30 mg via INTRAVENOUS
  Administered 2022-04-21: 20 mg via INTRAVENOUS

## 2022-04-21 MED ORDER — SUGAMMADEX SODIUM 200 MG/2ML IV SOLN
INTRAVENOUS | Status: DC | PRN
  Administered 2022-04-21 (×3): 100 mg via INTRAVENOUS

## 2022-04-21 MED ORDER — DEXAMETHASONE SODIUM PHOSPHATE 10 MG/ML IJ SOLN
INTRAMUSCULAR | Status: DC | PRN
  Administered 2022-04-21: 5 mg via INTRAVENOUS

## 2022-04-21 NOTE — Anesthesia Procedure Notes (Signed)
Procedure Name: Intubation ?Date/Time: 04/21/2022 8:17 AM ?Performed by: Janene Harvey, CRNA ?Pre-anesthesia Checklist: Patient identified, Emergency Drugs available, Suction available and Patient being monitored ?Patient Re-evaluated:Patient Re-evaluated prior to induction ?Oxygen Delivery Method: Circle system utilized ?Preoxygenation: Pre-oxygenation with 100% oxygen ?Induction Type: IV induction ?Ventilation: Mask ventilation without difficulty ?Laryngoscope Size: Glidescope and 3 ?Grade View: Grade I ?Tube type: Oral ?Tube size: 7.5 mm ?Number of attempts: 1 ?Airway Equipment and Method: Stylet and Oral airway ?Placement Confirmation: ETT inserted through vocal cords under direct vision, positive ETCO2 and breath sounds checked- equal and bilateral ?Secured at: 23 cm ?Tube secured with: Tape ?Dental Injury: Teeth and Oropharynx as per pre-operative assessment  ?Comments: DL with MAC 4, grade III view, unable to pass ett. Pt easily masked. DL with glidescope 3, grade I view, ett easily passed ? ? ? ? ?

## 2022-04-21 NOTE — Anesthesia Postprocedure Evaluation (Signed)
Anesthesia Post Note ? ?Patient: Alver Leete ? ?Procedure(s) Performed: MRI WITH ANESTHESIA OF BRAIN WITH AND WITHOUT CONTRAST ? ?  ? ?Patient location during evaluation: PACU ?Anesthesia Type: General ?Level of consciousness: awake and alert ?Pain management: pain level controlled ?Vital Signs Assessment: post-procedure vital signs reviewed and stable ?Respiratory status: spontaneous breathing, nonlabored ventilation and respiratory function stable ?Cardiovascular status: blood pressure returned to baseline ?Postop Assessment: no apparent nausea or vomiting ?Anesthetic complications: no ? ? ?No notable events documented. ? ?Last Vitals:  ?Vitals:  ? 04/21/22 0955 04/21/22 1010  ?BP: 135/83 122/85  ?Pulse: 89 86  ?Resp: 15 19  ?Temp:  36.6 ?C  ?SpO2: 100% 100%  ?  ?Last Pain:  ?Vitals:  ? 04/21/22 1010  ?TempSrc:   ?PainSc: 0-No pain  ? ? ?  ?  ?  ?  ?  ?  ? ?Marthenia Rolling ? ? ? ? ?

## 2022-04-21 NOTE — Transfer of Care (Signed)
Immediate Anesthesia Transfer of Care Note ? ?Patient: Ryan [name] ? ?Procedure(s) Performed: MRI WITH ANESTHESIA OF BRAIN WITH AND WITHOUT CONTRAST ? ?Patient Location: PACU ? ?Anesthesia Type:General ? ?Level of Consciousness: awake and patient cooperative ? ?Airway & Oxygen Therapy: Patient Spontanous Breathing and Patient connected to face mask oxygen ? ?Post-op Assessment: Report given to RN and Post -op Vital signs reviewed and stable ? ?Post vital signs: Reviewed and stable ? ?Last Vitals:  ?Vitals Value Taken Time  ?BP 128/60 04/21/22 0943  ?Temp    ?Pulse 86 04/21/22 0942  ?Resp 13   ?SpO2 98 % 04/21/22 0942  ?Vitals shown include unvalidated device data. ? ?Last Pain:  ?Vitals:  ? 04/21/22 0626  ?TempSrc:   ?PainSc: 0-No pain  ?   ? ?  ? ?Complications: No notable events documented. ?

## 2022-04-22 ENCOUNTER — Encounter (HOSPITAL_COMMUNITY): Payer: Self-pay | Admitting: Radiology

## 2022-04-23 ENCOUNTER — Other Ambulatory Visit: Payer: Self-pay | Admitting: Radiation Therapy

## 2022-04-23 ENCOUNTER — Telehealth: Payer: Self-pay

## 2022-04-23 DIAGNOSIS — C7931 Secondary malignant neoplasm of brain: Secondary | ICD-10-CM

## 2022-04-23 NOTE — Telephone Encounter (Signed)
RN called patient spoke with his wife [name]) about results of MRI and she was pleased with response.  And looks forward to hearing from the schedulers about next MRI appointment. ?

## 2022-04-23 NOTE — Telephone Encounter (Signed)
Returned his call. He is trying to get MRI results from yesterday. He can see the results in mychart but would like to speak with someone in Dr. Johny Shears office. Sent a message to Dr. Johny Shears nurse asking her to call him. ? ?He appreciated the call back. ?

## 2022-05-04 ENCOUNTER — Other Ambulatory Visit: Payer: Self-pay | Admitting: Radiation Oncology

## 2022-05-04 ENCOUNTER — Telehealth: Payer: Self-pay

## 2022-05-04 MED ORDER — MEMANTINE HCL 10 MG PO TABS: 10.0000 mg | ORAL_TABLET | Freq: Two times a day (BID) | ORAL | 11 refills | Status: DC

## 2022-05-04 NOTE — Telephone Encounter (Signed)
RN returned call to inform patient, told wife that refill of medication was called in per Dr. Tammi Klippel and they're free to pick up anytime today. ?

## 2022-05-13 ENCOUNTER — Inpatient Hospital Stay: Payer: BC Managed Care – PPO | Attending: Hematology and Oncology

## 2022-05-13 ENCOUNTER — Ambulatory Visit (HOSPITAL_COMMUNITY)
Admission: RE | Admit: 2022-05-13 | Discharge: 2022-05-13 | Disposition: A | Discharge status: Home / Self Care | Payer: BC Managed Care – PPO | Source: Ambulatory Visit | Attending: Hematology and Oncology | Admitting: Hematology and Oncology

## 2022-05-13 ENCOUNTER — Other Ambulatory Visit: Payer: Self-pay

## 2022-05-13 DIAGNOSIS — C3490 Malignant neoplasm of unspecified part of unspecified bronchus or lung: Secondary | ICD-10-CM | POA: Insufficient documentation

## 2022-05-13 DIAGNOSIS — R634 Abnormal weight loss: Secondary | ICD-10-CM | POA: Diagnosis not present

## 2022-05-13 DIAGNOSIS — C3491 Malignant neoplasm of unspecified part of right bronchus or lung: Secondary | ICD-10-CM | POA: Diagnosis present

## 2022-05-13 DIAGNOSIS — Z79899 Other long term (current) drug therapy: Secondary | ICD-10-CM | POA: Diagnosis not present

## 2022-05-13 DIAGNOSIS — C7931 Secondary malignant neoplasm of brain: Secondary | ICD-10-CM

## 2022-05-13 LAB — CBC WITH DIFFERENTIAL/PLATELET
Abs Immature Granulocytes: 0.01 10*3/uL (ref 0.00–0.07)
Basophils Absolute: 0 10*3/uL (ref 0.0–0.1)
Basophils Relative: 1 %
Eosinophils Absolute: 0.1 10*3/uL (ref 0.0–0.5)
Eosinophils Relative: 1 %
HCT: 32.4 % — ABNORMAL LOW (ref 39.0–52.0)
Hemoglobin: 10.4 g/dL — ABNORMAL LOW (ref 13.0–17.0)
Immature Granulocytes: 0 %
Lymphocytes Relative: 11 %
Lymphs Abs: 0.7 10*3/uL (ref 0.7–4.0)
MCH: 31.7 pg (ref 26.0–34.0)
MCHC: 32.1 g/dL (ref 30.0–36.0)
MCV: 98.8 fL (ref 80.0–100.0)
Monocytes Absolute: 0.8 10*3/uL (ref 0.1–1.0)
Monocytes Relative: 13 %
Neutro Abs: 4.3 10*3/uL (ref 1.7–7.7)
Neutrophils Relative %: 74 %
Platelets: 253 10*3/uL (ref 150–400)
RBC: 3.28 MIL/uL — ABNORMAL LOW (ref 4.22–5.81)
RDW: 15.7 % — ABNORMAL HIGH (ref 11.5–15.5)
WBC: 5.8 10*3/uL (ref 4.0–10.5)
nRBC: 0 % (ref 0.0–0.2)

## 2022-05-13 LAB — COMPREHENSIVE METABOLIC PANEL
ALT: 7 U/L (ref 0–44)
AST: 12 U/L — ABNORMAL LOW (ref 15–41)
Albumin: 3.6 g/dL (ref 3.5–5.0)
Alkaline Phosphatase: 72 U/L (ref 38–126)
Anion gap: 8 (ref 5–15)
BUN: 12 mg/dL (ref 8–23)
CO2: 28 mmol/L (ref 22–32)
Calcium: 9 mg/dL (ref 8.9–10.3)
Chloride: 101 mmol/L (ref 98–111)
Creatinine, Ser: 0.73 mg/dL (ref 0.61–1.24)
GFR, Estimated: >60 mL/min (ref >60)
Glucose, Bld: 110 mg/dL — ABNORMAL HIGH (ref 70–99)
Potassium: 3.9 mmol/L (ref 3.5–5.1)
Sodium: 137 mmol/L (ref 135–145)
Total Bilirubin: 0.6 mg/dL (ref 0.3–1.2)
Total Protein: 6.3 g/dL — ABNORMAL LOW (ref 6.5–8.1)

## 2022-05-13 LAB — GLUCOSE, CAPILLARY: Glucose-Capillary: 115 mg/dL — ABNORMAL HIGH (ref 70–99)

## 2022-05-13 LAB — SAMPLE TO BLOOD BANK

## 2022-05-13 LAB — TSH: TSH: 2.277 u[IU]/mL (ref 0.350–4.500)

## 2022-05-13 IMAGING — PT NM PET TUM IMG RESTAG (PS) SKULL BASE T - THIGH
1 of 8 series · 1 of 25 positions shown · non-contrast
Comparison: PET-CT dated [DATE]; CT angio dated [facility]

CLINICAL DATA: Follow-up treatment strategy for cell lung cancer.

EXAM:
NUCLEAR MEDICINE PET SKULL BASE TO THIGH
TECHNIQUE: 9.0 mCi F-18 FDG was injected intravenously. Full-ring PET imaging
was performed from the skull base to thigh after the radiotracer. CT
data was obtained and used for attenuation correction and anatomic
localization.
Fasting blood glucose: 113 mg/dl

[Series 4: ct sk_thigh 5.0 br38 · axial · 5.0mm · 0.98mm/px · 1 of 261 slices shown]
[im 261/261  brain]
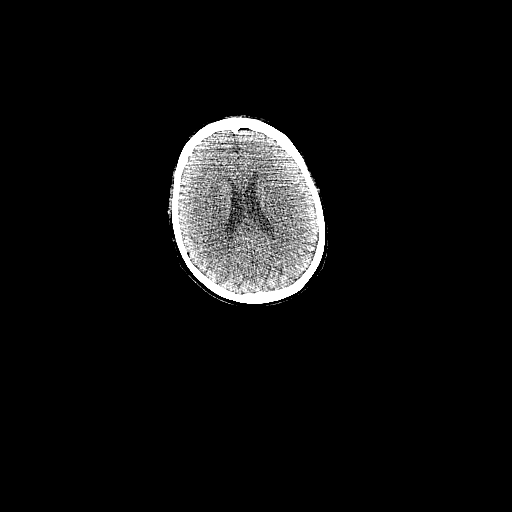

[1 of 25 positions shown; findings below may reference images not displayed]

FINDINGS: Mediastinal blood pool activity: SUV max

Liver activity: SUV max

NECK: No hypermetabolic lymph nodes in the neck.

Incidental CT findings: none

CHEST: Similar confluent mediastinal adenopathy. Right lower
paratracheal lymph node is unchanged in size but demonstrates
increased FDG activity with SUV max 2.9, previously 4.4. Stable
right upper lobe posttreatment changes.

Incidental CT findings: Centrilobular emphysema. Decreased
ground-glass opacities of the left upper and lower lobes when
compared to prior PET-CT. Similar mild peripheral consolidations
which are likely due to scarring. Small right pleural effusion.

ABDOMEN/PELVIS: New hypermetabolic lesion of the right lobe of the
liver measuring 1.7 x 2.3 cm on series 4, image 135 with an SUV max
of 6.5. Increased size and hypermetabolic activity of gastrohepatic
lymph node, measuring 2.4 x 3.1 cm on series 4, image 125,
previously 1.8 x 3.2 cm, SUV max of 6.2, previously 3.2.
Subcentimeter hypermetabolic periportal lymph node measuring 8 mm on
series 4, image 139 with an SUV max of 4.0. Right adrenal gland
nodule measures 2.4 cm with an SUV max of 6.9, previously 1.9 cm
with an SUV max of 4.6. Interval decreased size of omental nodule
measuring 1.7 x 1.0 cm on series 4, image 142, previously 2.5 x
cm, SUV max of 2.7, previously 3.1.

Incidental CT findings: Scattered low-density liver lesions which
are likely simple cysts. Atherosclerotic disease of the thoracic
aorta.

SKELETON: No focal hypermetabolic activity to suggest skeletal
metastasis.

Incidental CT findings: none
IMPRESSION: 1. Similar right upper lobe posttreatment changes and confluent
mediastinal adenopathy. Right lower paratracheal lymph node is
unchanged in size but demonstrates increased FDG uptake.
2. New hypermetabolic lesion of the right lobe of the liver.
3. Increased size and hypermetabolic activity of gastrohepatic lymph
node and new hypermetabolic subcentimeter periportal lymph node.
4. Increased size of hypermetabolic right adrenal nodule.
5. Decreased size and hypermetabolic activity of omental nodule.
6. New trace right pleural effusion.

## 2022-05-13 MED ORDER — FLUDEOXYGLUCOSE F - 18 (FDG) INJECTION
9.0400 | Freq: Once | INTRAVENOUS | Status: AC
  Administered 2022-05-13: 9.04 via INTRAVENOUS

## 2022-05-13 MED ORDER — HEPARIN SOD (PORK) LOCK FLUSH 100 UNIT/ML IV SOLN
500.0000 [IU] | Freq: Once | INTRAVENOUS | Status: AC
  Administered 2022-05-13: 500 [IU]

## 2022-05-13 MED ORDER — SODIUM CHLORIDE 0.9% FLUSH
10.0000 mL | Freq: Once | INTRAVENOUS | Status: AC
  Administered 2022-05-13: 10 mL

## 2022-05-15 ENCOUNTER — Inpatient Hospital Stay: Payer: BC Managed Care – PPO

## 2022-05-15 ENCOUNTER — Inpatient Hospital Stay: Payer: BC Managed Care – PPO | Admitting: Hematology and Oncology

## 2022-05-15 ENCOUNTER — Encounter: Payer: Self-pay | Admitting: Hematology and Oncology

## 2022-05-15 ENCOUNTER — Other Ambulatory Visit: Payer: Self-pay

## 2022-05-15 DIAGNOSIS — Z7189 Other specified counseling: Secondary | ICD-10-CM | POA: Diagnosis not present

## 2022-05-15 DIAGNOSIS — R634 Abnormal weight loss: Secondary | ICD-10-CM

## 2022-05-15 DIAGNOSIS — C3491 Malignant neoplasm of unspecified part of right bronchus or lung: Secondary | ICD-10-CM | POA: Diagnosis not present

## 2022-05-15 NOTE — Assessment & Plan Note (Signed)
Unfortunately, PET/CT imaging showed disease progression The patient's performance status remains poor and have lost tremendous amount of weight over the last few months We discussed goals of care, other treatment options briefly without getting into details and prognosis overall The patient is considered to have resistant relapse of disease, with disease is found to be progressive in 90 days from last treatment Even with chemotherapy, his overall prognosis and survival is poor Expected clinical benefit with any form of chemotherapy is in the region of less than 20% Chemotherapy, if it works, might give him several more weeks of survival, at the expense of risk of pancytopenia, and other side effects of treatment He is undecided He will call me next week once he has made informed decision If he wants to pursue additional treatment, I will see him back and we can get in to find details of treatment If he decides against treatment, I would recommend palliative care with hospice

## 2022-05-15 NOTE — Assessment & Plan Note (Signed)
We had extensive discussions about goals of care We discussed the role of tertiary referral/second opinion with another oncologist We discussed prognosis I do not recommend the patient to return back to work at this point

## 2022-05-15 NOTE — Progress Notes (Signed)
[facility] OFFICE PROGRESS NOTE  Patient Care Team: [name], MD as PCP - General (Hematology and Oncology) Valrie Hart, RN as Oncology Nurse Navigator (Oncology)  ASSESSMENT & PLAN:  Metastatic primary lung cancer, right [facility]) Unfortunately, PET/CT imaging showed disease progression The patient's performance status remains poor and have lost tremendous amount of weight over the last few months We discussed goals of care, other treatment options briefly without getting into details and prognosis overall The patient is considered to have resistant relapse of disease, with disease is found to be progressive in 90 days from last treatment Even with chemotherapy, his overall prognosis and survival is poor Expected clinical benefit with any form of chemotherapy is in the region of less than 20% Chemotherapy, if it works, might give him several more weeks of survival, at the expense of risk of pancytopenia, and other side effects of treatment He is undecided He will call me next week once he has made informed decision If he wants to pursue additional treatment, I will see him back and we can get in to find details of treatment If he decides against treatment, I would recommend palliative care with hospice  Weight loss He has lost a lot of weight recently He will continue Remeron and frequent small meals  Goals of care, counseling/discussion We had extensive discussions about goals of care We discussed the role of tertiary referral/second opinion with another oncologist We discussed prognosis I do not recommend the patient to return back to work at this point  No orders of the defined types were placed in this encounter.   All questions were answered. The patient knows to call the clinic with any problems, questions or concerns. The total time spent in the appointment was 40 minutes encounter with patients including review of chart and various tests results,  discussions about plan of care and coordination of care plan   [name], MD 05/15/2022 10:04 AM  INTERVAL HISTORY: Please see below for problem oriented charting. he returns for treatment follow-up with his significant other I have noticed that the patient has lost some weight He denies pain Denies recent cough, chest pain or shortness of breath We spent significant amount of time today reviewing goals of care  REVIEW OF SYSTEMS:   Constitutional: Denies fevers, chills  Eyes: Denies blurriness of vision Ears, nose, mouth, throat, and face: Denies mucositis or sore throat Respiratory: Denies cough, dyspnea or wheezes Cardiovascular: Denies palpitation, chest discomfort or lower extremity swelling Gastrointestinal:  Denies nausea, heartburn or change in bowel habits Skin: Denies abnormal skin rashes Lymphatics: Denies new lymphadenopathy or easy bruising Neurological:Denies numbness, tingling or new weaknesses Behavioral/Psych: Mood is stable, no new changes  All other systems were reviewed with the patient and are negative.  I have reviewed the past medical history, past surgical history, social history and family history with the patient and they are unchanged from previous note.  ALLERGIES:  has No Known Allergies.  MEDICATIONS:  Current Outpatient Medications  Medication Sig Dispense Refill   atorvastatin (LIPITOR) 10 MG tablet Take 1 tablet (10 mg total) by mouth daily. 30 tablet 3   cholecalciferol (VITAMIN D3) 25 MCG (1000 UNIT) tablet Take 1,000 Units by mouth daily.     lidocaine-prilocaine (EMLA) cream Apply 1 application topically as needed. 30 g 3   LORazepam (ATIVAN) 1 MG tablet Take 1 tablet (1 mg total) by mouth as needed for anxiety (take one tablet 30 minutes prior to MRI scans and  may repeat just before the scan if needed.). 40 tablet 0   memantine (NAMENDA) 10 MG tablet Take 1 tablet (10 mg total) by mouth 2 (two) times daily. 60 tablet 11   mirtazapine  (REMERON) 30 MG tablet Take 1 tablet (30 mg total) by mouth at bedtime. 90 tablet 1   Multiple Vitamins-Minerals (MULTIVITAMIN WITH MINERALS) tablet Take 1 tablet by mouth daily.     ondansetron (ZOFRAN) 8 MG tablet Take 1 tablet (8 mg total) by mouth every 8 (eight) hours as needed for nausea or vomiting. (Patient not taking: Reported on 04/16/2022) 30 tablet 1   pantoprazole (PROTONIX) 40 MG tablet Take 1 tablet (40 mg total) by mouth daily. 30 tablet 3   No current facility-administered medications for this visit.    SUMMARY OF ONCOLOGIC HISTORY: Oncology History  Metastatic cancer to brain ([place])  11/27/2021 Initial Diagnosis   Metastatic cancer to brain ([place])    12/03/2021 - 04/17/2022 Chemotherapy   Patient is on Treatment Plan : LUNG SMALL CELL Carboplatin D1 / Etoposide D1-3 q21d      Metastatic primary lung cancer, right ([place])  11/26/2021 Imaging   There is 2.9 cm lobulated noncalcified pleural-based nodule in the right upper lobe suggesting possible primary malignant neoplasm. There are pathologically enlarged bulky lymph nodes in the mediastinum and both hilar regions, more so on the right side suggesting metastatic lymphadenopathy. There is extrinsic pressure over the right main pulmonary artery and superior vena cava by the enlarged lymph nodes in mediastinum.   Increased interstitial markings and small scattered nodules are seen in the right lung which may be part of pneumonia or pulmonary metastatic disease.   There are nodular densities in both adrenals largest measuring 3 cm in the left adrenal suggesting possible metastatic disease. There are enlarged lymph nodes in the retroperitoneum and mesentery largest measuring 5.9 cm in maximum diameter suggesting metastatic lymphadenopathy.   There are multiple cysts in the liver. There are other indeterminate low-density lesions of varying sizes in the liver. Possibility of hepatic metastatic disease is not excluded.   Head of the  pancreas is enlarged in size with lobulated margins. Possibility of neoplastic process in the head of the pancreas is not excluded. Follow-up PET-CT and biopsy as warranted should be considered.   Coronary artery calcifications are seen. Other findings as described in the body of the report.   11/26/2021 - 12/05/2021 Hospital Admission   He was admitted to the hospital and found to have metastatic lung cancer   11/27/2021 Initial Diagnosis   Metastatic primary lung cancer, right ([place])    11/27/2021 Imaging   MRI HEAD IMPRESSION:   1. 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 Mild localized edema with hemorrhagic blood products about several of these lesions without significant regional mass effect or midline shift.  2. 3.2 x 6.6 x 4.8 cm enhancing extra-axial mass at the posterior aspect of the superior sagittal sinus, indeterminate. While this finding could reflect an additional dural-based metastasis, a possible concomitant meningioma could also be considered.   MRV HEAD IMPRESSION:   1. Invasion and obliteration of the posterior superior sagittal sinus by the above described dural based mass. Small amount of additional nonocclusive thrombus within the superior sagittal sinus anterior to the mass as above. 2. Remainder of the major dural sinuses are otherwise patent.     11/28/2021 Pathology Results   A. 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  Please see comment.   The following immunostains are performed on block A2 with appropriate  controls:  CK7: Negative.  TTF-1: Positive.  Chromogranin: Negative.  Synoptophysin: Negative.  CD56: Positive.  Ki-67: Very high proliferative index (more than 90%).   Comment: Morphologic features of the neoplasm are most consistent with metastasis from a primary small cell carcinoma in the  lung.    12/03/2021 - 04/17/2022 Chemotherapy   Patient is on Treatment Plan : LUNG SMALL CELL Carboplatin D1 / Etoposide D1-3 q21d      12/03/2021 -  Chemotherapy   Patient is on Treatment Plan :  LUNG SMALL CELL Carboplatin D1 / Etoposide D1-3 q21d     12/09/2021 Cancer Staging   Staging form: Lung, AJCC 8th Edition - Clinical: Stage IV (cT4, cN3, pM1) - Signed by [name], MD on 12/09/2021    01/14/2022 PET scan   1. Since the CTs of 11/26/2021, response to therapy of widespread metastatic small-cell carcinoma as detailed above. Near complete resolution right upper lobe primary with significant improvement in thoracic nodal, adrenal, abdominal nodal, and peritoneal metastasis. 2. No new or progressive disease. 3. Development of left-sided hypermetabolic pulmonary opacity. Suspicious for infection.     05/14/2022 PET scan   1. Similar right upper lobe posttreatment changes and confluent mediastinal adenopathy. Right lower paratracheal lymph node is unchanged in size but demonstrates increased FDG uptake. 2. New hypermetabolic lesion of the right lobe of the liver.  3. Increased size and hypermetabolic activity of gastrohepatic lymph node and new hypermetabolic subcentimeter periportal lymph node. 4. Increased size of hypermetabolic right adrenal nodule. 5. Decreased size and hypermetabolic activity of omental nodule. 6. New trace right pleural effusion.     PHYSICAL EXAMINATION: ECOG PERFORMANCE STATUS: 1 - Symptomatic but completely ambulatory  Vitals:   05/15/22 0923  BP: 112/75  Pulse: (!) 115  Resp: 18  Temp: 98.4 F (36.9 C)  SpO2: 100%   Filed Weights   05/15/22 0923  Weight: 165 lb 12.8 oz (75.2 kg)    GENERAL:alert, no distress and comfortable NEURO: alert & oriented x 3 with fluent speech, no focal motor/sensory deficits  LABORATORY DATA:  I have reviewed the data as listed    Component Value Date/Time   NA 137 05/13/2022 0738   K 3.9 05/13/2022 0738    CL 101 05/13/2022 0738   CO2 28 05/13/2022 0738   GLUCOSE 110 (H) 05/13/2022 0738   BUN 12 05/13/2022 0738   CREATININE 0.73 05/13/2022 0738   CREATININE 0.55 (L) 12/19/2021 1426   CALCIUM 9.0 05/13/2022 0738   PROT 6.3 (L) 05/13/2022 0738   ALBUMIN 3.6 05/13/2022 0738   AST 12 (L) 05/13/2022 0738   AST 16 12/19/2021 1426   ALT 7 05/13/2022 0738   ALT 48 (H) 12/19/2021 1426   ALKPHOS 72 05/13/2022 0738   BILITOT 0.6 05/13/2022 0738   BILITOT 0.5 12/19/2021 1426   GFRNONAA >60 05/13/2022 0738   GFRNONAA >60 12/19/2021 1426   GFRAA >60 01/01/2019 0726    No results found for: SPEP, UPEP  Lab Results  Component Value Date   WBC 5.8 05/13/2022   NEUTROABS 4.3 05/13/2022   HGB 10.4 (L) 05/13/2022   HCT 32.4 (L) 05/13/2022   MCV 98.8 05/13/2022   PLT 253 05/13/2022      Chemistry      Component Value Date/Time   NA 137 05/13/2022 0738   K 3.9 05/13/2022 0738   CL 101  05/13/2022 0738   CO2 28 05/13/2022 0738   BUN 12 05/13/2022 0738   CREATININE 0.73 05/13/2022 0738   CREATININE 0.55 (L) 12/19/2021 1426      Component Value Date/Time   CALCIUM 9.0 05/13/2022 0738   ALKPHOS 72 05/13/2022 0738   AST 12 (L) 05/13/2022 0738   AST 16 12/19/2021 1426   ALT 7 05/13/2022 0738   ALT 48 (H) 12/19/2021 1426   BILITOT 0.6 05/13/2022 0738   BILITOT 0.5 12/19/2021 1426       RADIOGRAPHIC STUDIES: I have personally reviewed the radiological images as listed and agreed with the findings in the report. MR Brain W Wo Contrast  Result Date: 04/21/2022 CLINICAL DATA:  Brain metastases, assess treatment response 3T SRS Protocol. Lung cancer, follow-up EXAM: MRI HEAD WITHOUT AND WITH CONTRAST TECHNIQUE: Multiplanar, multiecho pulse sequences of the brain and surrounding structures were obtained without and with intravenous contrast. CONTRAST:  7.9m GADAVIST GADOBUTROL 1 MMOL/ML IV SOLN COMPARISON:  11/27/2021 FINDINGS: Brain: Significant improvement in intracranial lesions.  Comparison with the prior study is limited by differences in slice thickness. There remains some irregular enhancement in the lateral right cerebellar hemisphere measuring about 1.3 cm (series 1200, image 104). Supratentorially, the largest remaining lesion is in the right middle frontal gyrus measuring 6 mm (series 1200, image 241). No definite new lesion. For example, a 4 mm lesion of the right frontal lobe (image 226) is most likely present on the prior study on image 39 of series 26. The extra-axial mass along the superior sagittal sinus has substantially regressed. There remains occlusion of the superior sagittal sinus. Increased patchy T2 hyperintensity in the supratentorial white matter is probably related to treatment. No acute infarction ventricles are similar in size. Remains dural thickening and enhancement along the posterior cerebral convexities. Vascular: Major vessel flow voids at the skull base are preserved. Skull and upper cervical spine: Marrow signal is within normal limits. Sinuses/Orbits: Minor mucosal thickening.  Orbits are unremarkable. Other: Sella is unremarkable.  Mastoid air cells are clear. IMPRESSION: Significant regression of previous metastatic disease. No definite new disease. Electronically Signed   By: PMacy MisM.D.   On: 04/21/2022 13:54   NM PET Image Restage (PS) Skull Base to Thigh (F-18 FDG)  Result Date: 05/14/2022 CLINICAL DATA:  Follow-up treatment strategy for cell lung cancer. EXAM: NUCLEAR MEDICINE PET SKULL BASE TO THIGH TECHNIQUE: 9.0 mCi F-18 FDG was injected intravenously. Full-ring PET imaging was performed from the skull base to thigh after the radiotracer. CT data was obtained and used for attenuation correction and anatomic localization. Fasting blood glucose: 113 mg/dl COMPARISON:  PET-CT dated January 13, 2022; CT angio dated January 27, 2022 FINDINGS: Mediastinal blood pool activity: SUV max 2.4 Liver activity: SUV max 2.6 NECK: No hypermetabolic  lymph nodes in the neck. Incidental CT findings: none CHEST: Similar confluent mediastinal adenopathy. Right lower paratracheal lymph node is unchanged in size but demonstrates increased FDG activity with SUV max 2.9, previously 4.4. Stable right upper lobe posttreatment changes. Incidental CT findings: Centrilobular emphysema. Decreased ground-glass opacities of the left upper and lower lobes when compared to prior PET-CT. Similar mild peripheral consolidations which are likely due to scarring. Small right pleural effusion. ABDOMEN/PELVIS: New hypermetabolic lesion of the right lobe of the liver measuring 1.7 x 2.3 cm on series 4, image 135 with an SUV max of 6.5. Increased size and hypermetabolic activity of gastrohepatic lymph node, measuring 2.4 x 3.1 cm on series 4, image 125, previously  1.8 x 3.2 cm, SUV max of 6.2, previously 3.2. Subcentimeter hypermetabolic periportal lymph node measuring 8 mm on series 4, image 139 with an SUV max of 4.0. Right adrenal gland nodule measures 2.4 cm with an SUV max of 6.9, previously 1.9 cm with an SUV max of 4.6. Interval decreased size of omental nodule measuring 1.7 x 1.0 cm on series 4, image 142, previously 2.5 x 1.6 cm, SUV max of 2.7, previously 3.1. Incidental CT findings: Scattered low-density liver lesions which are likely simple cysts. Atherosclerotic disease of the thoracic aorta. SKELETON: No focal hypermetabolic activity to suggest skeletal metastasis. Incidental CT findings: none IMPRESSION: 1. Similar right upper lobe posttreatment changes and confluent mediastinal adenopathy. Right lower paratracheal lymph node is unchanged in size but demonstrates increased FDG uptake. 2. New hypermetabolic lesion of the right lobe of the liver. 3. Increased size and hypermetabolic activity of gastrohepatic lymph node and new hypermetabolic subcentimeter periportal lymph node. 4. Increased size of hypermetabolic right adrenal nodule. 5. Decreased size and hypermetabolic  activity of omental nodule. 6. New trace right pleural effusion. Electronically Signed   By: Yetta Glassman M.D.   On: 05/14/2022 15:22

## 2022-05-15 NOTE — Progress Notes (Signed)
Per Dr. Alvy Bimler - patient will not be receiving treatment today.  Infusion aware of canceled appointment.

## 2022-05-15 NOTE — Assessment & Plan Note (Signed)
He has lost a lot of weight recently He will continue Remeron and frequent small meals

## 2022-05-22 ENCOUNTER — Telehealth: Payer: Self-pay

## 2022-05-22 ENCOUNTER — Other Ambulatory Visit: Payer: Self-pay

## 2022-05-22 DIAGNOSIS — C3491 Malignant neoplasm of unspecified part of right bronchus or lung: Secondary | ICD-10-CM

## 2022-05-22 NOTE — Progress Notes (Signed)
[place] Work  Clinical Social Work was referred by medical provider for assessment of psychosocial needs.  Clinical Social Worker contacted patient by phone  to offer support and assess for needs.  CSW spoke with patient and he indicated caregiver is currently working.  Patient requested CSW call back to speak with caregiver at the same time.  CSW will contact patient on 05/26/22 by phone at 9:30am.      Margaree Mackintosh, [place] Worker [facility]

## 2022-05-22 NOTE — Telephone Encounter (Signed)
Called and spoke with [name]. They still are thinking about everything. Will call back Monday with decision. Asking for referral to social worker. Referral sent.

## 2022-05-22 NOTE — Telephone Encounter (Signed)
-----   Message from [name], MD sent at 05/22/2022  8:40 AM EDT ----- [name]/patient is supposed to call back with decision whether he wants more chemo or hospice

## 2022-05-25 ENCOUNTER — Other Ambulatory Visit: Payer: Self-pay | Admitting: Radiation Oncology

## 2022-05-26 NOTE — Progress Notes (Signed)
[facility] Work  Initial Assessment   Ryan [name] is a 65 y.o. year old male contacted caregiver by phone. Patient was also on the phone.  Clinical Social Work was referred by medical provider for assessment of psychosocial needs.   SDOH (Social Determinants of Health) assessments performed: Yes SDOH Interventions    Flowsheet Row Most Recent Value  SDOH Interventions   Food Insecurity Interventions Intervention Not Indicated  Financial Strain Interventions Intervention Not Indicated  Housing Interventions Intervention Not Indicated       SDOH Screenings   Alcohol Screen: Not on file  Depression (PHQ2-9): Not on file  Financial Resource Strain: Low Risk    Difficulty of Paying Living Expenses: Not hard at all  Food Insecurity: Unknown   Worried About Charity fundraiser in the Last Year: Not on file   [employer] of Food in the Last Year: Never true  Housing: Low Risk    Last Housing Risk Score: 0  Physical Activity: Not on file  Social Connections: Not on file  Stress: Not on file  Tobacco Use: Medium Risk   Smoking Tobacco Use: Former   Smokeless Tobacco Use: Never   Passive Exposure: Not on file  Transportation Needs: No Transportation Needs   Lack of Transportation (Medical): No   Lack of Transportation (Non-Medical): No     Distress Screen completed: No.  Patient did not express feelings of anxiety during phone call.    03/26/2022    9:09 AM  ONCBCN DISTRESS SCREENING  Distress experienced in past week (1-10) 10  Emotional problem type Nervousness/Anxiety;Adjusting to illness      Family/Social Information:  Housing Arrangement: patient lives with his wife, Ryan [name], and Ryan [name]'s mother. Family members/support persons in your life? Family and Friends.  Ryan [name]'s two sisters and son live in [place] and are supportive. Transportation concerns: no.  Patient drives himself. Employment: Retired. His wife is still working. Income source: Supported by [facility] and  Friends.  Financial concerns: No Type of concern: None Food access concerns: no,  Religious or spiritual practice: Yes-Patient stated he believed in God. Services Currently in place:  None Coping/ Adjustment to diagnosis: Patient understands treatment plan and what happens next? Yes.  He considering not taking chemotherapy any longer due to the side effects. Concerns about diagnosis and/or treatment: Quality of life.  Ryan [name] is concerned about Ryan [name]'s quality of life as his condition decreases. Patient reported stressors: Facing my mortality. Hopes and/or priorities: [name] expressed strong faith in God. Patient enjoys  Ryan [name] said he likes driving to the store. Current coping skills/ strengths: Capable of independent living  and Supportive family/friends     SUMMARY: Current SDOH Barriers:  None expressed at this time.  Clinical Social Work Clinical Goal(s):  Patient will work with SW to address concerns related to his adjustment to his prognosis.  Interventions: Discussed common feeling and emotions when being diagnosed with cancer, and the importance of support during treatment Informed patient of the support team roles and support services at [facility] Provided CSW contact information and encouraged patient to call with any questions or concerns Provided patient with information about Palliative Care and Hospice.   Ryan [name] expressed wanting counseling in the future.  SW to mail support group information.   Follow Up Plan: Patient will contact CSW with any support or resource needs Patient verbalizes understanding of plan: Yes    Ryan Pickle Jarelly Rinck, LCSW

## 2022-06-04 ENCOUNTER — Encounter: Payer: Self-pay | Admitting: General Practice

## 2022-06-04 ENCOUNTER — Telehealth: Payer: Self-pay

## 2022-06-04 NOTE — Telephone Encounter (Signed)
Returned call. Spoke with Wille Glaser and [name]. Ryan [name] is complaining that he has not slept since Monday. He is taking the Remeron as usual. He has not been taking the Ativan. Instructed to take Ativan now and see if that helps him relax. He is agreeable and would like appt with Dr. Alvy Bimler. Appt scheduled tomorrow at [facility].  Per [name], last night was the only night he had problems sleeping. They still have not made a decision regarding hospice or further treatment. The social worker called recently and they appreciated the call. Sent a message per [name] to Chaplain asking if she could call [place] next week.

## 2022-06-04 NOTE — Progress Notes (Signed)
[facility] Spiritual Care Note  Referred by nursing for spiritual and emotional support. Reached Mr Lipke briefly by phone to introduce Spiritual Care as part of his support team. We plan to speak in more detail the week after next, when schedules align.   [place], [place], [facility] Pager [phone] Voicemail [phone]

## 2022-06-05 ENCOUNTER — Other Ambulatory Visit (HOSPITAL_COMMUNITY): Payer: Self-pay

## 2022-06-05 ENCOUNTER — Inpatient Hospital Stay: Payer: BC Managed Care – PPO | Attending: Hematology and Oncology | Admitting: Hematology and Oncology

## 2022-06-05 ENCOUNTER — Encounter: Payer: Self-pay | Admitting: Hematology and Oncology

## 2022-06-05 ENCOUNTER — Telehealth: Payer: Self-pay

## 2022-06-05 ENCOUNTER — Other Ambulatory Visit: Payer: Self-pay

## 2022-06-05 DIAGNOSIS — Z7189 Other specified counseling: Secondary | ICD-10-CM | POA: Diagnosis not present

## 2022-06-05 DIAGNOSIS — C3491 Malignant neoplasm of unspecified part of right bronchus or lung: Secondary | ICD-10-CM | POA: Diagnosis present

## 2022-06-05 DIAGNOSIS — R630 Anorexia: Secondary | ICD-10-CM | POA: Diagnosis not present

## 2022-06-05 DIAGNOSIS — R634 Abnormal weight loss: Secondary | ICD-10-CM | POA: Diagnosis not present

## 2022-06-05 DIAGNOSIS — G893 Neoplasm related pain (acute) (chronic): Secondary | ICD-10-CM | POA: Insufficient documentation

## 2022-06-05 MED ORDER — OXYCODONE HCL 10 MG PO TABS
10.0000 mg | ORAL_TABLET | ORAL | 0 refills | Status: AC | PRN
  Filled 2022-06-05: qty 60, 10d supply, fill #0

## 2022-06-05 MED ORDER — LORAZEPAM 1 MG PO TABS
1.0000 mg | ORAL_TABLET | Freq: Two times a day (BID) | ORAL | 0 refills | Status: AC | PRN
  Filled 2022-06-05: qty 60, 30d supply, fill #0

## 2022-06-05 NOTE — Assessment & Plan Note (Signed)
He is aware of poor prognosis I recommend palliative care/hospice referral and he is in agreement We discussed prognosis likely to be less than 3 months We discussed CODE STATUS I gave him DNR order

## 2022-06-05 NOTE — Progress Notes (Signed)
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   All questions were answered. The patient knows to call the clinic with any problems, questions or concerns. The total time spent in the appointment was 40 minutes encounter with patients including review of chart and various tests results, discussions about plan of care and coordination of care plan   [name], MD 06/05/2022 1:48 PM  INTERVAL HISTORY: Please see below for problem oriented charting. he returns for further discussion about goals of care The patient has progressive decline He is getting weaker His appetite is very poor He has noticed some intermittent rib pain He has difficulty sleeping He has made informed decision not to pursue chemotherapy or further treatment Denies recent cough, chest pain or shortness of breath No new neurological  deficits  REVIEW OF SYSTEMS:   Constitutional: Denies fevers, chills  Eyes: Denies blurriness of vision Ears, nose, mouth, throat, and face: Denies mucositis or sore throat Respiratory: Denies cough, dyspnea or wheezes Cardiovascular: Denies palpitation, chest discomfort or lower extremity swelling Gastrointestinal:  Denies nausea, heartburn or change in bowel habits Skin: Denies abnormal skin rashes Lymphatics: Denies new lymphadenopathy or easy bruising Behavioral/Psych: Mood is stable, no new changes  All other systems were reviewed with the patient and are negative.  I have reviewed the past medical history, past surgical history, social history and family history with the patient and they are unchanged from previous note.  ALLERGIES:  has No Known Allergies.  MEDICATIONS:  Current Outpatient Medications  Medication Sig Dispense Refill   Oxycodone HCl 10 MG TABS Take 1 tablet (10 mg total) by mouth every 4 (four) hours as needed for severe pain. 60 tablet 0   atorvastatin (LIPITOR) 10 MG tablet Take 1 tablet (10 mg total) by mouth daily. 30 tablet 3   cholecalciferol (VITAMIN D3) 25 MCG (1000 UNIT) tablet Take 1,000 Units by mouth daily.     lidocaine-prilocaine (EMLA) cream Apply 1 application topically as needed. 30 g 3   LORazepam (ATIVAN) 1 MG tablet Take 1 tablet (1 mg total) by mouth 2 (two) times daily as needed for anxiety or sleep. 60 tablet 0   memantine (NAMENDA) 10 MG tablet TAKE 1 TABLET(10 MG) BY MOUTH TWICE DAILY 60 tablet 11   mirtazapine (REMERON) 30 MG tablet Take 1 tablet (30 mg total) by mouth at bedtime. 90 tablet 1   Multiple Vitamins-Minerals (MULTIVITAMIN WITH MINERALS) tablet Take 1 tablet by mouth daily.  ondansetron (ZOFRAN) 8 MG tablet Take 1 tablet (8 mg total) by mouth every 8 (eight) hours as needed for nausea or vomiting. (Patient not taking: Reported on 04/16/2022) 30 tablet 1   pantoprazole (PROTONIX) 40 MG tablet Take 1 tablet (40 mg total) by  mouth daily. 30 tablet 3   No current facility-administered medications for this visit.    SUMMARY OF ONCOLOGIC HISTORY: Oncology History  Metastatic cancer to brain ([place])  11/27/2021 Initial Diagnosis   Metastatic cancer to brain ([place])   12/03/2021 - 04/17/2022 Chemotherapy   Patient is on Treatment Plan : LUNG SMALL CELL Carboplatin D1 / Etoposide D1-3 q21d     Metastatic primary lung cancer, right ([place])  11/26/2021 Imaging   There is 2.9 cm lobulated noncalcified pleural-based nodule in the right upper lobe suggesting possible primary malignant neoplasm. There are pathologically enlarged bulky lymph nodes in the mediastinum and both hilar regions, more so on the right side suggesting metastatic lymphadenopathy. There is extrinsic pressure over the right main pulmonary artery and superior vena cava by the enlarged lymph nodes in mediastinum.   Increased interstitial markings and small scattered nodules are seen in the right lung which may be part of pneumonia or pulmonary metastatic disease.   There are nodular densities in both adrenals largest measuring 3 cm in the left adrenal suggesting possible metastatic disease. There are enlarged lymph nodes in the retroperitoneum and mesentery largest measuring 5.9 cm in maximum diameter suggesting metastatic lymphadenopathy.   There are multiple cysts in the liver. There are other indeterminate low-density lesions of varying sizes in the liver. Possibility of hepatic metastatic disease is not excluded.   Head of the pancreas is enlarged in size with lobulated margins. Possibility of neoplastic process in the head of the pancreas is not excluded. Follow-up PET-CT and biopsy as warranted should be considered.   Coronary artery calcifications are seen. Other findings as described in the body of the report.   11/26/2021 - 12/05/2021 Hospital Admission   He was admitted to the hospital and found to have metastatic lung cancer   11/27/2021 Initial  Diagnosis   Metastatic primary lung cancer, right ([place])   11/27/2021 Imaging   MRI HEAD IMPRESSION:   1. 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 Mild localized edema with hemorrhagic blood products about several of these lesions without significant regional mass effect or midline shift.  2. 3.2 x 6.6 x 4.8 cm enhancing extra-axial mass at the posterior aspect of the superior sagittal sinus, indeterminate. While this finding could reflect an additional dural-based metastasis, a possible concomitant meningioma could also be considered.   MRV HEAD IMPRESSION:   1. Invasion and obliteration of the posterior superior sagittal sinus by the above described dural based mass. Small amount of additional nonocclusive thrombus within the superior sagittal sinus anterior to the mass as above. 2. Remainder of the major dural sinuses are otherwise patent.     11/28/2021 Pathology Results   A. PERITONEAL MASS, UPPER ABDOMINAL MIDLINE, BIOPSY:  High-grade neuroendocrine carcinoma, small cell type (Small cell  carcinoma).  Please see comment.   The following immunostains are performed on block A2 with appropriate  controls:  CK7: Negative.  TTF-1: Positive.  Chromogranin: Negative.  Synoptophysin: Negative.  CD56: Positive.  Ki-67: Very high proliferative index (more than 90%).   Comment: Morphologic features of the neoplasm are most consistent with metastasis from a primary small cell carcinoma in the lung.    12/03/2021 - 04/17/2022  Chemotherapy   Patient is on Treatment Plan : LUNG SMALL CELL Carboplatin D1 / Etoposide D1-3 q21d     12/03/2021 -  Chemotherapy   Patient is on Treatment Plan :  LUNG SMALL CELL Carboplatin D1 / Etoposide D1-3 q21d     12/09/2021 Cancer Staging   Staging form: Lung, AJCC 8th Edition - Clinical: Stage IV (cT4, cN3, pM1) - Signed by [name], MD on 12/09/2021   01/14/2022 PET scan   1. Since the  CTs of 11/26/2021, response to therapy of widespread metastatic small-cell carcinoma as detailed above. Near complete resolution right upper lobe primary with significant improvement in thoracic nodal, adrenal, abdominal nodal, and peritoneal metastasis. 2. No new or progressive disease. 3. Development of left-sided hypermetabolic pulmonary opacity. Suspicious for infection.     05/14/2022 PET scan   1. Similar right upper lobe posttreatment changes and confluent mediastinal adenopathy. Right lower paratracheal lymph node is unchanged in size but demonstrates increased FDG uptake. 2. New hypermetabolic lesion of the right lobe of the liver.  3. Increased size and hypermetabolic activity of gastrohepatic lymph node and new hypermetabolic subcentimeter periportal lymph node. 4. Increased size of hypermetabolic right adrenal nodule. 5. Decreased size and hypermetabolic activity of omental nodule. 6. New trace right pleural effusion.     PHYSICAL EXAMINATION: ECOG PERFORMANCE STATUS: 3 - Symptomatic, >50% confined to bed  Vitals:   06/05/22 0828  BP: 114/62  Pulse: (!) 118  Resp: 18  Temp: 98.6 F (37 C)  SpO2: 100%   Filed Weights   06/05/22 0828  Weight: 155 lb (70.3 kg)    GENERAL:alert, no distress and comfortable.  He looks thin and cachectic NEURO: alert & oriented x 3 with fluent speech, no focal motor/sensory deficits  LABORATORY DATA:  I have reviewed the data as listed    Component Value Date/Time   NA 137 05/13/2022 0738   K 3.9 05/13/2022 0738   CL 101 05/13/2022 0738   CO2 28 05/13/2022 0738   GLUCOSE 110 (H) 05/13/2022 0738   BUN 12 05/13/2022 0738   CREATININE 0.73 05/13/2022 0738   CREATININE 0.55 (L) 12/19/2021 1426   CALCIUM 9.0 05/13/2022 0738   PROT 6.3 (L) 05/13/2022 0738   ALBUMIN 3.6 05/13/2022 0738   AST 12 (L) 05/13/2022 0738   AST 16 12/19/2021 1426   ALT 7 05/13/2022 0738   ALT 48 (H) 12/19/2021 1426   ALKPHOS 72 05/13/2022 0738   BILITOT  0.6 05/13/2022 0738   BILITOT 0.5 12/19/2021 1426   GFRNONAA >60 05/13/2022 0738   GFRNONAA >60 12/19/2021 1426   GFRAA >60 01/01/2019 0726    No results found for: "SPEP", "UPEP"  Lab Results  Component Value Date   WBC 5.8 05/13/2022   NEUTROABS 4.3 05/13/2022   HGB 10.4 (L) 05/13/2022   HCT 32.4 (L) 05/13/2022   MCV 98.8 05/13/2022   PLT 253 05/13/2022      Chemistry      Component Value Date/Time   NA 137 05/13/2022 0738   K 3.9 05/13/2022 0738   CL 101 05/13/2022 0738   CO2 28 05/13/2022 0738   BUN 12 05/13/2022 0738   CREATININE 0.73 05/13/2022 0738   CREATININE 0.55 (L) 12/19/2021 1426      Component Value Date/Time   CALCIUM 9.0 05/13/2022 0738   ALKPHOS 72 05/13/2022 0738   AST 12 (L) 05/13/2022 0738   AST 16 12/19/2021 1426   ALT 7 05/13/2022 0738   ALT 48 (H)  12/19/2021 1426   BILITOT 0.6 05/13/2022 0738   BILITOT 0.5 12/19/2021 1426

## 2022-06-05 NOTE — Assessment & Plan Note (Signed)
The patient continues on general decline He has made informed decision to enroll in palliative care with hospice He continues to have poor appetite, intermittent bone pain and insomnia I will provide medications for bone pain and insomnia I will get him enrolled with palliative care and hospice

## 2022-06-05 NOTE — Telephone Encounter (Signed)
Called and given info to [place] with [facility]. She will reach out to Gemayel/[name] this afternoon. Hospice will take over care.

## 2022-06-05 NOTE — Assessment & Plan Note (Signed)
He has cancer associated pain I recommend low-dose pain medicine and dexamethasone

## 2022-06-05 NOTE — Assessment & Plan Note (Signed)
His progressive weight loss is due to malignant cachexia He will continue Remeron and oral intake as tolerated I also recommend low-dose dexamethasone in the morning

## 2022-06-15 ENCOUNTER — Encounter: Payer: Self-pay | Admitting: General Practice

## 2022-06-15 ENCOUNTER — Encounter: Payer: Self-pay | Admitting: *Deleted

## 2022-06-15 NOTE — Progress Notes (Signed)
Oncology Nurse Navigator Documentation     06/15/2022   10:00 AM 12/04/2021   10:00 AM  Oncology Nurse Navigator Flowsheets  Abnormal Finding Date  11/26/2021  Confirmed Diagnosis Date  11/28/2021  Diagnosis Status  Confirmed Diagnosis Complete  Planned Course of Treatment  Chemotherapy;Radiation  Phase of Treatment Chemo Radiation  Chemotherapy Actual Start Date:  12/03/2021  Chemotherapy Actual End Date: 04/17/2022   Radiation Actual Start Date:  11/28/2021  Radiation Actual End Date: 12/16/2021   Navigator Follow Up Date:  12/22/2021  Navigator Follow Up Reason:  Review Note  Navigation Complete Date: 06/15/2022   Post Navigation: Continue to Follow Patient? No   Reason Not Navigating Patient: Hospice/Death   Navigator Location [facility]  Referral Date to RadOnc/MedOnc  11/26/2021  Navigator Encounter Type Appt/Treatment Plan Review Inpatient  Treatment Initiated Date  11/28/2021  Patient Visit Type  Initial;Inpatient  Treatment Phase Other Treatment  Barriers/Navigation Needs Coordination of Care Education  Education  Newly Diagnosed Cancer Education;Smoking cessation  Interventions Coordination of Care Education;Psycho-Social Support  Acuity Level 2-Minimal Needs (1-2 Barriers Identified) Level 4-High Needs (Greater Than 4 Barriers Identified)  Coordination of Care Other   Education Method  Verbal  Time Spent with Patient 15 45

## 2022-07-01 ENCOUNTER — Other Ambulatory Visit: Payer: Self-pay | Admitting: Hematology and Oncology

## 2022-07-18 ENCOUNTER — Other Ambulatory Visit: Payer: Self-pay | Admitting: Hematology and Oncology

## 2022-07-24 ENCOUNTER — Encounter: Payer: Self-pay | Admitting: Urology

## 2022-07-24 NOTE — Progress Notes (Signed)
Telephone appointment I spoke with patient's daughter Ms. [name], verified her identity and began nursing interview. She reports patient having occasional RUQ ABD pain 5/10. No other issues reported at this time.  Meaningful use complete.  Reminded [name] of patient's 10:30-07/29/22 telephone appointment w/ [name]. I left my extension [phone] in case patient needs anything. Ms. [name] verbalized understanding.  Patient contact for this appointment- Ms. [name] [phone]

## 2022-07-29 ENCOUNTER — Ambulatory Visit
Admission: RE | Admit: 2022-07-29 | Discharge: 2022-07-29 | Disposition: A | Discharge status: Home / Self Care | Payer: Medicare Other | Source: Ambulatory Visit | Attending: Urology | Admitting: Urology

## 2022-08-21 DEATH — deceased

## 2022-12-16 ENCOUNTER — Encounter: Payer: Self-pay | Admitting: Urology

## 2022-12-16 DIAGNOSIS — C7931 Secondary malignant neoplasm of brain: Secondary | ICD-10-CM
